# Patient Record
Sex: Female | Born: 1990 | Race: Black or African American | Hispanic: No | Marital: Single | Smoking: Current every day smoker
Health system: Southern US, Community
[De-identification: ages and names within clinical notes are randomized; demographics above are authoritative.]

## PROBLEM LIST (undated history)

## (undated) DIAGNOSIS — J45909 Unspecified asthma, uncomplicated: Secondary | ICD-10-CM

## (undated) DIAGNOSIS — K219 Gastro-esophageal reflux disease without esophagitis: Secondary | ICD-10-CM

## (undated) DIAGNOSIS — A64 Unspecified sexually transmitted disease: Secondary | ICD-10-CM

## (undated) DIAGNOSIS — G43909 Migraine, unspecified, not intractable, without status migrainosus: Secondary | ICD-10-CM

## (undated) HISTORY — PX: OTHER SURGICAL HISTORY: SHX169

## (undated) HISTORY — DX: Unspecified sexually transmitted disease: A64

## (undated) HISTORY — DX: Migraine, unspecified, not intractable, without status migrainosus: G43.909

---
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
## 2012-02-08 ENCOUNTER — Encounter (HOSPITAL_COMMUNITY): Payer: Self-pay | Admitting: *Deleted

## 2012-02-08 ENCOUNTER — Emergency Department (HOSPITAL_COMMUNITY)
Admission: EM | Admit: 2012-02-08 | Discharge: 2012-02-08 | Disposition: A | Discharge status: Home / Self Care | Payer: Self-pay | Attending: Emergency Medicine | Admitting: Emergency Medicine

## 2012-02-08 DIAGNOSIS — F17200 Nicotine dependence, unspecified, uncomplicated: Secondary | ICD-10-CM | POA: Insufficient documentation

## 2012-02-08 DIAGNOSIS — Z7982 Long term (current) use of aspirin: Secondary | ICD-10-CM | POA: Insufficient documentation

## 2012-02-08 DIAGNOSIS — N390 Urinary tract infection, site not specified: Secondary | ICD-10-CM | POA: Insufficient documentation

## 2012-02-08 DIAGNOSIS — R51 Headache: Secondary | ICD-10-CM | POA: Insufficient documentation

## 2012-02-08 DIAGNOSIS — M129 Arthropathy, unspecified: Secondary | ICD-10-CM | POA: Insufficient documentation

## 2012-02-08 LAB — URINALYSIS, ROUTINE W REFLEX MICROSCOPIC
Bilirubin Urine: NEGATIVE
Glucose, UA: NEGATIVE mg/dL
Ketones, ur: NEGATIVE mg/dL
Nitrite: NEGATIVE
Protein, ur: NEGATIVE mg/dL
Specific Gravity, Urine: 1.017 (ref 1.005–1.030)
Urobilinogen, UA: 0.2 mg/dL (ref 0.0–1.0)
pH: 6 (ref 5.0–8.0)

## 2012-02-08 LAB — PREGNANCY, URINE: Preg Test, Ur: NEGATIVE

## 2012-02-08 LAB — URINE MICROSCOPIC-ADD ON

## 2012-02-08 MED ORDER — KETOROLAC TROMETHAMINE 30 MG/ML IJ SOLN
15.0000 mg | Freq: Once | INTRAMUSCULAR | Status: AC
  Administered 2012-02-08: 11:00:00 via INTRAVENOUS
  Filled 2012-02-08: qty 1

## 2012-02-08 MED ORDER — SULFAMETHOXAZOLE-TRIMETHOPRIM 800-160 MG PO TABS: 1.0000 | ORAL_TABLET | Freq: Two times a day (BID) | ORAL | Status: AC

## 2012-02-08 MED ORDER — OXYCODONE-ACETAMINOPHEN 5-325 MG PO TABS
2.0000 | ORAL_TABLET | Freq: Once | ORAL | Status: AC
  Administered 2012-02-08: 2 via ORAL
  Filled 2012-02-08: qty 2

## 2012-02-08 NOTE — ED Notes (Signed)
Pt states "been having headaches off and on for 1 week, the abdominal pain has been going on for about 2 days, it hurts when laying on my stomach"

## 2012-02-08 NOTE — ED Provider Notes (Signed)
History    21 year old female with a chief complaint of headache. "Sometimes hurts on the left. Sometimes hurts on the right. Symptoms are worse on the back. I was a hurts everywhere. Intermittent. Achy in character. Patient reports relief with Excedrin, but the headache returns when she wakes up from sleeping or taking a nap.  No neck pain or neck stiffness. No acute visual changes. No numbness, tingling or loss of strength. No fevers or chills. Has had similar headaches previously. Denies trauma. Denies use of blood thinning medication. Denies history of cancer. Patient also reports mild diffuse abdominal pain for the past 2 days. Patient has the pain when she sleeps on her stomach. Has not noticed the pain at any other times. No nausea or vomiting. No urinary complaints. No unusual vaginal bleeding or discharge. Denies history of abdominal or pelvic surgery.  CSN: [id number]  Arrival date & time 02/08/12  7829   First MD Initiated Contact with Patient 02/08/12 [phone]      Chief Complaint  Patient presents with  . Abdominal Pain  . Headache    (Consider location/radiation/quality/duration/timing/severity/associated sxs/prior treatment) HPI  Past Medical History  Diagnosis Date  . Arthritis     History reviewed. No pertinent past surgical history.  No family history on file.  History  Substance Use Topics  . Smoking status: Current Everyday Smoker -- 0.5 packs/day  . Smokeless tobacco: Not on file  . Alcohol Use: No    OB History    Grav Para Term Preterm Abortions TAB SAB Ect Mult Living                  Review of Systems   Review of symptoms negative unless otherwise noted in HPI.   Allergies  Review of patient's allergies indicates no known allergies.  Home Medications   Current Outpatient Rx  Name Route Sig Dispense Refill  . ALBUTEROL SULFATE HFA 108 (90 BASE) MCG/ACT IN AERS Inhalation Inhale 2 puffs into the lungs every 6 (six) hours as needed. For shortness  of breath    . ASPIRIN-ACETAMINOPHEN-CAFFEINE 250-250-65 MG PO TABS Oral Take 2 tablets by mouth every 6 (six) hours as needed. For      BP 125/98  Pulse 86  Temp 98 F (36.7 C) (Oral)  Resp 18  Wt 227 lb 3.2 oz (103.057 kg)  SpO2 99%  LMP 01/18/2012  Physical Exam  Nursing note and vitals reviewed. Constitutional: She is oriented to person, place, and time. She appears well-developed and well-nourished. No distress.       Laying in bed. No acute distress. Obese.  HENT:  Head: Normocephalic and atraumatic.  Eyes: Conjunctivae are normal. Right eye exhibits no discharge. Left eye exhibits no discharge.  Neck: Normal range of motion. Neck supple.       Neck is supple.  Cardiovascular: Normal rate, regular rhythm and normal heart sounds.  Exam reveals no gallop and no friction rub.   No murmur heard. Pulmonary/Chest: Effort normal and breath sounds normal. No respiratory distress.  Abdominal: Soft. She exhibits no distension. There is no tenderness.       Abdomen soft and nontender. No masses palpated. No surgical scars noted.  Genitourinary:       No CVA tenderness  Musculoskeletal: She exhibits no edema and no tenderness.  Lymphadenopathy:    She has no cervical adenopathy.  Neurological: She is alert and oriented to person, place, and time. No cranial nerve deficit. She exhibits normal muscle tone. Coordination normal.  Good finger nose testing bilaterally. Gait is steady.  Skin: Skin is warm and dry. She is not diaphoretic.  Psychiatric: She has a normal mood and affect. Her behavior is normal. Thought content normal.    ED Course  Procedures (including critical care time)  Labs Reviewed  URINALYSIS, ROUTINE W REFLEX MICROSCOPIC - Abnormal; Notable for the following:    APPearance CLOUDY (*)     Hgb urine dipstick MODERATE (*)     Leukocytes, UA LARGE (*)     All other components within normal limits  URINE MICROSCOPIC-ADD ON - Abnormal; Notable for the following:     Squamous Epithelial / LPF FEW (*)     Bacteria, UA FEW (*)     All other components within normal limits  PREGNANCY, URINE   No results found.   1. Headache   2. UTI (lower urinary tract infection)       MDM  49-year-old female with headache. Very low suspicion for emergent etiology. Patient has a nonfocal neurological examination and no acute neurological complaints. There is no history of trauma. She is afebrile and no signs of meningismus. Headache was gradual onset. She does not use blood thinning medication. Please do not treatment at this time. Abdominal exam is benign. Not pregnant. UA suggestive of infection. Plan course abx. Do not feel that imaging or further labs indicated at this time.       Raeford Razor, MD 02/08/12 1028

## 2012-02-13 ENCOUNTER — Emergency Department (HOSPITAL_COMMUNITY)
Admission: EM | Admit: 2012-02-13 | Discharge: 2012-02-13 | Disposition: A | Discharge status: Home / Self Care | Payer: Self-pay | Attending: Emergency Medicine | Admitting: Emergency Medicine

## 2012-02-13 ENCOUNTER — Encounter (HOSPITAL_COMMUNITY): Payer: Self-pay | Admitting: Emergency Medicine

## 2012-02-13 DIAGNOSIS — F17200 Nicotine dependence, unspecified, uncomplicated: Secondary | ICD-10-CM | POA: Insufficient documentation

## 2012-02-13 DIAGNOSIS — R6889 Other general symptoms and signs: Secondary | ICD-10-CM | POA: Insufficient documentation

## 2012-02-13 DIAGNOSIS — T370X5A Adverse effect of sulfonamides, initial encounter: Secondary | ICD-10-CM | POA: Insufficient documentation

## 2012-02-13 DIAGNOSIS — T7840XA Allergy, unspecified, initial encounter: Secondary | ICD-10-CM

## 2012-02-13 LAB — URINALYSIS, ROUTINE W REFLEX MICROSCOPIC
Bilirubin Urine: NEGATIVE
Hgb urine dipstick: NEGATIVE
Ketones, ur: NEGATIVE mg/dL
Protein, ur: NEGATIVE mg/dL
Urobilinogen, UA: 0.2 mg/dL (ref 0.0–1.0)

## 2012-02-13 LAB — URINE MICROSCOPIC-ADD ON

## 2012-02-13 MED ORDER — FAMOTIDINE 20 MG PO TABS
20.0000 mg | ORAL_TABLET | Freq: Once | ORAL | Status: AC
  Administered 2012-02-13: 20 mg via ORAL
  Filled 2012-02-13: qty 1

## 2012-02-13 MED ORDER — DIPHENHYDRAMINE HCL 25 MG PO CAPS
25.0000 mg | ORAL_CAPSULE | Freq: Once | ORAL | Status: AC
  Administered 2012-02-13: 25 mg via ORAL
  Filled 2012-02-13: qty 1

## 2012-02-13 MED ORDER — FAMOTIDINE 40 MG PO TABS: 40.0000 mg | ORAL_TABLET | Freq: Every day | ORAL | Status: DC

## 2012-02-13 MED ORDER — PREDNISONE 20 MG PO TABS: ORAL_TABLET | ORAL | Status: AC

## 2012-02-13 MED ORDER — PREDNISONE 20 MG PO TABS
60.0000 mg | ORAL_TABLET | Freq: Once | ORAL | Status: AC
  Administered 2012-02-13: 60 mg via ORAL
  Filled 2012-02-13: qty 3

## 2012-02-13 NOTE — ED Notes (Signed)
Pt st's she thinks she had an allergic reaction to ex-lax, pt st's she felt her throat closing up after taking it.  Pt in no acute distress, all lung sounds clear, no stridor.  No swelling or rash.

## 2012-02-13 NOTE — ED Provider Notes (Signed)
History     CSN: [id number]  Arrival date & time 02/13/12  0112   First MD Initiated Contact with Patient 02/13/12 0217      Chief Complaint  Patient presents with  . Allergic Reaction    (Consider location/radiation/quality/duration/timing/severity/associated sxs/prior treatment) Patient is a 21 y.o. female presenting with allergic reaction. The history is provided by the patient.  Allergic Reaction The primary symptoms do not include wheezing, cough, abdominal pain, nausea, vomiting, diarrhea, dizziness, palpitations, rash, angioedema or urticaria. The current episode started 3 to 5 hours ago. The problem has not changed since onset.This is a new problem.  The onset of the reaction was associated with a change in medication. Significant symptoms that are not present include eye redness, flushing, rhinorrhea or itching.  Taking bactrim for UTI and then took ex lax and felt throat tightening so came in for evaluation.  No hives.    Past Medical History  Diagnosis Date  . Arthritis     History reviewed. No pertinent past surgical history.  No family history on file.  History  Substance Use Topics  . Smoking status: Current Everyday Smoker -- 0.5 packs/day  . Smokeless tobacco: Not on file  . Alcohol Use: No    OB History    Grav Para Term Preterm Abortions TAB SAB Ect Mult Living                  Review of Systems  HENT: Negative for rhinorrhea.   Eyes: Negative for redness.  Respiratory: Negative for cough and wheezing.   Cardiovascular: Negative for palpitations.  Gastrointestinal: Negative for nausea, vomiting, abdominal pain and diarrhea.  Skin: Negative for color change, flushing, itching and rash.  Neurological: Negative for dizziness.  All other systems reviewed and are negative.    Allergies  Review of patient's allergies indicates no known allergies.  Home Medications   Current Outpatient Rx  Name Route Sig Dispense Refill  .  ASPIRIN-ACETAMINOPHEN-CAFFEINE 250-250-65 MG PO TABS Oral Take 2 tablets by mouth every 6 (six) hours as needed. For    . SULFAMETHOXAZOLE-TRIMETHOPRIM 800-160 MG PO TABS Oral Take 1 tablet by mouth every 12 (twelve) hours. 10 tablet 0  . ALBUTEROL SULFATE HFA 108 (90 BASE) MCG/ACT IN AERS Inhalation Inhale 2 puffs into the lungs every 6 (six) hours as needed. For shortness of breath      BP 119/69  Pulse 77  Temp 98.5 F (36.9 C) (Oral)  Resp 16  SpO2 100%  LMP 01/18/2012  Physical Exam  Constitutional: She appears well-developed and well-nourished.  HENT:  Head: Normocephalic and atraumatic.  Mouth/Throat: Oropharynx is clear and moist.       No swelling of the lips tongue or uvula  Eyes: Conjunctivae are normal. Pupils are equal, round, and reactive to light.  Neck: Normal range of motion. Neck supple.  Cardiovascular: Normal rate and regular rhythm.   Pulmonary/Chest: Effort normal and breath sounds normal. No stridor. She has no wheezes. She has no rales.  Abdominal: Soft. Bowel sounds are normal. There is no tenderness. There is no rebound and no guarding.  Musculoskeletal: Normal range of motion.  Neurological: She is alert.  Skin: Skin is warm and dry. No rash noted.  Psychiatric: She has a normal mood and affect.    ED Course  Procedures (including critical care time)  Labs Reviewed  URINALYSIS, ROUTINE W REFLEX MICROSCOPIC - Abnormal; Notable for the following:    Leukocytes, UA SMALL (*)  All other components within normal limits  URINE MICROSCOPIC-ADD ON - Abnormal; Notable for the following:    Squamous Epithelial / LPF FEW (*)     All other components within normal limits   No results found.   No diagnosis found.    MDM  Stop bactrim and list as allergy additionally no more ex lax.  Return for worsening symptoms.  Wheezing hives or any concerns.  Take medications as directed along with benadryl.  Follow up with your PMD in 2 days patient verbalizes  understanding and agrees to follow up        Dashanique Brownstein Smitty Cords, MD 02/13/12 [phone]

## 2012-02-13 NOTE — ED Notes (Signed)
Pt alert, arrives from home, ? Allergic reaction to ex lax, pt states recent constipation after UTI, took some ex lax, feels throat was tight, resp even unlabored, skin pwd, no stridor noted

## 2012-04-23 DIAGNOSIS — R079 Chest pain, unspecified: Secondary | ICD-10-CM | POA: Insufficient documentation

## 2012-04-23 DIAGNOSIS — M25569 Pain in unspecified knee: Secondary | ICD-10-CM | POA: Insufficient documentation

## 2012-04-24 ENCOUNTER — Emergency Department (HOSPITAL_COMMUNITY)
Admission: EM | Admit: 2012-04-24 | Discharge: 2012-04-24 | Disposition: A | Discharge status: Home / Self Care | Payer: Self-pay | Attending: Emergency Medicine | Admitting: Emergency Medicine

## 2012-04-24 ENCOUNTER — Encounter (HOSPITAL_COMMUNITY): Payer: Self-pay | Admitting: *Deleted

## 2012-04-24 ENCOUNTER — Emergency Department (HOSPITAL_COMMUNITY)
Admission: EM | Admit: 2012-04-24 | Discharge: 2012-04-24 | Discharge status: Against Medical Advice | Payer: Self-pay | Attending: Emergency Medicine | Admitting: Emergency Medicine

## 2012-04-24 ENCOUNTER — Emergency Department (HOSPITAL_COMMUNITY): Payer: Self-pay

## 2012-04-24 DIAGNOSIS — F17200 Nicotine dependence, unspecified, uncomplicated: Secondary | ICD-10-CM | POA: Insufficient documentation

## 2012-04-24 DIAGNOSIS — R079 Chest pain, unspecified: Secondary | ICD-10-CM | POA: Insufficient documentation

## 2012-04-24 DIAGNOSIS — R0789 Other chest pain: Secondary | ICD-10-CM

## 2012-04-24 HISTORY — DX: Unspecified asthma, uncomplicated: J45.909

## 2012-04-24 MED ORDER — IBUPROFEN 800 MG PO TABS
800.0000 mg | ORAL_TABLET | Freq: Once | ORAL | Status: AC
  Administered 2012-04-24: 800 mg via ORAL
  Filled 2012-04-24: qty 1

## 2012-04-24 NOTE — ED Notes (Signed)
MD at bedside. 

## 2012-04-24 NOTE — ED Provider Notes (Signed)
History     CSN: [id number]  Arrival date & time 04/24/12  1701   First MD Initiated Contact with Patient 04/24/12 1826      Chief Complaint  Patient presents with  . Chest Pain  . Knee Pain    left    (Consider location/radiation/quality/duration/timing/severity/associated sxs/prior treatment) The history is provided by the patient.  Frances Murphy is a 21 y.o. female here with chest pain, SOB. Mid sternal chest pain yesterday after lifting heavy trays. 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 She felt a little SOB but denies fever or cough. No recent travel or leg swelling. Only PE risk factor is OCP use. She also has L knee pain that is chronic. She had an old injury as a child and now it hurts intermittently. Able to ambulate on the leg. Only cardiac risk factor is that she is a smoker.    Past Medical History  Diagnosis Date  . Asthma     History reviewed. No pertinent past surgical history.  No family history on file.  History  Substance Use Topics  . Smoking status: Current Every Day Smoker -- 0.5 packs/day  . Smokeless tobacco: Not on file  . Alcohol Use: No    OB History    Grav Para Term Preterm Abortions TAB SAB Ect Mult Living                  Review of Systems  Respiratory: Positive for shortness of breath.   Cardiovascular: Positive for chest pain.  Musculoskeletal:       Leg pain  All other systems reviewed and are negative.    Allergies  Review of patient's allergies indicates no known allergies.  Home Medications  No current outpatient prescriptions on file.  BP 116/65  Pulse 66  Temp 98.6 F (37 C) (Oral)  Resp 18  SpO2 100%  LMP 03/19/2012  Physical Exam  Nursing note and vitals reviewed. Constitutional: She is oriented to person, place, and time. She appears well-developed and well-nourished.       Comfortable   HENT:  Head: Normocephalic.  Mouth/Throat: Oropharynx is clear and moist.  Eyes: Conjunctivae normal are normal. Pupils are  equal, round, and reactive to light.  Neck: Normal range of motion. Neck supple.  Cardiovascular: Normal rate, regular rhythm and normal heart sounds.   Pulmonary/Chest: Effort normal and breath sounds normal.       + reproducible tenderness on palpation of sternum  Abdominal: Soft. Bowel sounds are normal.  Musculoskeletal: Normal range of motion.       No calf tenderness, no edema.   L knee there is calcification of the tibial tuberosity without any tenderness. No tenderness of ROM of knee.   Neurological: She is alert and oriented to person, place, and time.  Skin: Skin is warm and dry.  Psychiatric: She has a normal mood and affect. Her behavior is normal. Judgment and thought content normal.    ED Course  Procedures (including critical care time)  Labs Reviewed - No data to display Dg Chest 2 View  04/24/2012  *RADIOLOGY REPORT*  Clinical Data: 21 year old female with chest pain.  CHEST - 2 VIEW  Comparison: None.  Findings: Cardiac size at the upper limits of normal. Other mediastinal contours are within normal limits.  Visualized tracheal air column is within normal limits.  Lung volumes are within normal limits.  The lungs are clear.  No pneumothorax. No acute osseous abnormality identified.  IMPRESSION: No acute cardiopulmonary abnormality.  Original Report Authenticated By: Harley Hallmark, M.D.      No diagnosis found.   Date: 04/24/2012  Rate: 83  Rhythm: normal sinus rhythm  QRS Axis: normal  Intervals: normal  ST/T Wave abnormalities: nonspecific ST changes  Conduction Disutrbances:none  Narrative Interpretation:   Old EKG Reviewed: unchanged    MDM  Frances Murphy is a 21 y.o. female here with chest pain that is reproducible. EKG unchanged, she is low risk for ACS and PERC negative for PE. Will give NSAIDS, get CXR and reassess.   8:12 PM Felt better after meds. CXR, EKG nl, will d/c home.        Richardean Canal, MD 04/24/12 2012

## 2012-04-24 NOTE — ED Notes (Signed)
Chest pain upper mid sternum started yesterday, also has left knee pain from old injury

## 2012-04-24 NOTE — ED Notes (Signed)
Pt states she's having midsternal chest pain that started last night, states feels like pressure in the middle of her chest, states pain radiates up in shoulders bil, having shortness of breath per pt, denies n/v/d, blurred vision, numbness or tingling. 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 Also having L knee pain, mother states when pt was little had injury to that knee and since has had a knot on knee that has gotten bigger, pt states knee hurts worse after working 12 hour shifts, but if she is off for a couple days it will feel better. Pt in no distress at this time.

## 2012-07-03 ENCOUNTER — Encounter (HOSPITAL_COMMUNITY): Payer: Self-pay | Admitting: Emergency Medicine

## 2012-07-03 ENCOUNTER — Emergency Department (HOSPITAL_COMMUNITY)
Admission: EM | Admit: 2012-07-03 | Discharge: 2012-07-03 | Disposition: A | Discharge status: Home / Self Care | Payer: Self-pay | Attending: Emergency Medicine | Admitting: Emergency Medicine

## 2012-07-03 DIAGNOSIS — J3489 Other specified disorders of nose and nasal sinuses: Secondary | ICD-10-CM | POA: Insufficient documentation

## 2012-07-03 DIAGNOSIS — Z79899 Other long term (current) drug therapy: Secondary | ICD-10-CM | POA: Insufficient documentation

## 2012-07-03 DIAGNOSIS — F17200 Nicotine dependence, unspecified, uncomplicated: Secondary | ICD-10-CM | POA: Insufficient documentation

## 2012-07-03 DIAGNOSIS — H9209 Otalgia, unspecified ear: Secondary | ICD-10-CM | POA: Insufficient documentation

## 2012-07-03 DIAGNOSIS — J029 Acute pharyngitis, unspecified: Secondary | ICD-10-CM | POA: Insufficient documentation

## 2012-07-03 DIAGNOSIS — J45909 Unspecified asthma, uncomplicated: Secondary | ICD-10-CM | POA: Insufficient documentation

## 2012-07-03 DIAGNOSIS — R071 Chest pain on breathing: Secondary | ICD-10-CM | POA: Insufficient documentation

## 2012-07-03 DIAGNOSIS — J069 Acute upper respiratory infection, unspecified: Secondary | ICD-10-CM | POA: Insufficient documentation

## 2012-07-03 DIAGNOSIS — R05 Cough: Secondary | ICD-10-CM | POA: Insufficient documentation

## 2012-07-03 DIAGNOSIS — R6889 Other general symptoms and signs: Secondary | ICD-10-CM | POA: Insufficient documentation

## 2012-07-03 DIAGNOSIS — R059 Cough, unspecified: Secondary | ICD-10-CM | POA: Insufficient documentation

## 2012-07-03 MED ORDER — HYDROCODONE-ACETAMINOPHEN 7.5-500 MG/15ML PO SOLN: 15.0000 mL | Freq: Four times a day (QID) | ORAL | Status: DC | PRN

## 2012-07-03 MED ORDER — HYDROCODONE-ACETAMINOPHEN 7.5-500 MG/15ML PO SOLN
10.0000 mL | Freq: Once | ORAL | Status: AC
  Administered 2012-07-03: 10 mL via ORAL
  Filled 2012-07-03: qty 15

## 2012-07-03 NOTE — ED Notes (Signed)
Pt reports chest wall pain with cough, sinus pressure, sore throat and r/ear pain x 48 hrs

## 2012-07-03 NOTE — ED Provider Notes (Signed)
Medical screening examination/treatment/procedure(s) were performed by non-physician practitioner and as supervising physician I was immediately available for consultation/collaboration.  Juliet Rude. Rubin Payor, MD 07/03/12 1546

## 2012-07-03 NOTE — ED Provider Notes (Signed)
History     CSN: [id number]  Arrival date & time 07/03/12  1310   First MD Initiated Contact with Patient 07/03/12 1401      Chief Complaint  Patient presents with  . Facial Pain    sinus pressure x48hrs  . Sore Throat  . Nasal Congestion  . Cough    chest wall pain with cough x 48 hrs    (Consider location/radiation/quality/duration/timing/severity/associated sxs/prior treatment) HPI  21 year old female presents with URI sxs.  Pt reports for the past 2 days she has gradual onset of headache, nasal congestion, sore throat, sneezing, non productive cough, and chest congestion.  Severity is mild/moderate.  Denies fever, chills, neck stiffness, sob, abd pain, n/v/d, or rash.  Has tried tylenol allegra and albuterol inhaler without relief.  No recent sick contact.  Pt is a smoker, and has hx of asthma.    Past Medical History  Diagnosis Date  . Asthma     History reviewed. No pertinent past surgical history.  Family History  Problem Relation Age of Onset  . Hypertension Mother   . Hypertension Father     History  Substance Use Topics  . Smoking status: Current Every Day Smoker -- 0.5 packs/day  . Smokeless tobacco: Not on file  . Alcohol Use: No    OB History    Grav Para Term Preterm Abortions TAB SAB Ect Mult Living                  Review of Systems  Constitutional: Negative for fever and chills.  HENT: Positive for ear pain, congestion, sore throat, rhinorrhea, sneezing and sinus pressure. Negative for drooling, mouth sores, trouble swallowing, neck pain, neck stiffness, voice change and postnasal drip.   Respiratory: Positive for cough. Negative for chest tightness and shortness of breath.   Cardiovascular: Negative for chest pain.  Gastrointestinal: Negative for abdominal pain.  Skin: Negative for rash.    Allergies  Review of patient's allergies indicates no known allergies.  Home Medications   Current Outpatient Rx  Name  Route  Sig  Dispense   Refill  . FEXOFENADINE HCL 180 MG PO TABS   Oral   Take 180 mg by mouth once.         Marland Kitchen PSEUDOEPHED-APAP-GUAIFENESIN 30-325-200 MG PO TABS   Oral   Take 2 tablets by mouth once.           BP 114/70  Pulse 89  Temp 98.1 F (36.7 C) (Oral)  Resp 18  SpO2 100%  LMP 06/24/2012  Physical Exam  Nursing note and vitals reviewed. Constitutional: She is oriented to person, place, and time. She appears well-developed and well-nourished. No distress.  HENT:  Right Ear: Hearing, tympanic membrane and external ear normal.  Left Ear: Hearing, tympanic membrane and external ear normal.  Nose: Rhinorrhea present. No septal deviation or nasal septal hematoma. Right sinus exhibits no maxillary sinus tenderness and no frontal sinus tenderness. Left sinus exhibits no maxillary sinus tenderness and no frontal sinus tenderness.  Mouth/Throat: Uvula is midline, oropharynx is clear and moist and mucous membranes are normal. No uvula swelling or dental caries. No oropharyngeal exudate or posterior oropharyngeal edema.  Eyes: Conjunctivae normal and EOM are normal. Pupils are equal, round, and reactive to light.  Neck: Normal range of motion. Neck supple.  Cardiovascular: Normal rate and regular rhythm.   Pulmonary/Chest: Effort normal and breath sounds normal.  Abdominal: Soft. Bowel sounds are normal. There is no tenderness.  Lymphadenopathy:    She has no cervical adenopathy.  Neurological: She is alert and oriented to person, place, and time.  Skin: Skin is warm. No rash noted.  Psychiatric: She has a normal mood and affect.    ED Course  Procedures (including critical care time)  Labs Reviewed - No data to display No results found.   No diagnosis found.  1. URI  MDM  Pt with URI sxs.  She is afebrile, VSS.  Lung CTAB.    Will d/c with lortab elixir.  Care instruction given.    BP 114/70  Pulse 89  Temp 98.1 F (36.7 C) (Oral)  Resp 18  SpO2 100%  LMP  06/24/2012       Fayrene Helper, PA-C 07/03/12 1459

## 2012-08-22 ENCOUNTER — Encounter (HOSPITAL_COMMUNITY): Payer: Self-pay | Admitting: *Deleted

## 2012-08-22 ENCOUNTER — Emergency Department (HOSPITAL_COMMUNITY)
Admission: EM | Admit: 2012-08-22 | Discharge: 2012-08-23 | Disposition: A | Discharge status: Home / Self Care | Payer: Medicaid Other | Attending: Emergency Medicine | Admitting: Emergency Medicine

## 2012-08-22 DIAGNOSIS — R42 Dizziness and giddiness: Secondary | ICD-10-CM | POA: Insufficient documentation

## 2012-08-22 DIAGNOSIS — R51 Headache: Secondary | ICD-10-CM | POA: Insufficient documentation

## 2012-08-22 DIAGNOSIS — H539 Unspecified visual disturbance: Secondary | ICD-10-CM | POA: Insufficient documentation

## 2012-08-22 DIAGNOSIS — J45909 Unspecified asthma, uncomplicated: Secondary | ICD-10-CM | POA: Insufficient documentation

## 2012-08-22 DIAGNOSIS — Z79899 Other long term (current) drug therapy: Secondary | ICD-10-CM | POA: Insufficient documentation

## 2012-08-22 DIAGNOSIS — F17200 Nicotine dependence, unspecified, uncomplicated: Secondary | ICD-10-CM | POA: Insufficient documentation

## 2012-08-22 DIAGNOSIS — M542 Cervicalgia: Secondary | ICD-10-CM | POA: Insufficient documentation

## 2012-08-22 DIAGNOSIS — Z7982 Long term (current) use of aspirin: Secondary | ICD-10-CM | POA: Insufficient documentation

## 2012-08-22 NOTE — ED Provider Notes (Signed)
History  This chart was scribed for non-physician practitioner working with Glynn Octave, MD by Erskine Emery, ED Scribe. This patient was seen in room WTR5/WTR5 and the patient's care was started at 23:28.   CSN: [id number]  Arrival date & time 08/22/12  2209   First MD Initiated Contact with Patient 08/22/12 2328      No chief complaint on file.   (Consider location/radiation/quality/duration/timing/severity/associated sxs/prior treatment) The history is provided by the patient. No language interpreter was used.  Frances Murphy is a 22 y.o. female who presents to the Emergency Department complaining of a constant throbbing right-sided headache with no radiation of about an 8-9/10 severity since Friday (4 days). Pt reports some associated light headedness, dizziness, photophobia, and right-sided neck pain but denies any neck stiffness, fever, chills, rash, numbness, weakness, nausea, emesis, diarrhea, or aversion to sound. Pt reports a h/o headaches but reports this headache is different because it is not relieved by her normal meds (excedrin migraine and tylenol). Pt is not pregnant that she knows of, she recently took a home pregnancy test that was negative. She is a smoker, about 5-6 cigarettes/day. She reports she is trying to quit. Pt has no PCP.  Past Medical History  Diagnosis Date  . Asthma     History reviewed. No pertinent past surgical history.  Family History  Problem Relation Age of Onset  . Hypertension Mother   . Hypertension Father     History  Substance Use Topics  . Smoking status: Current Every Day Smoker -- 0.5 packs/day  . Smokeless tobacco: Not on file  . Alcohol Use: No    OB History    Grav Para Term Preterm Abortions TAB SAB Ect Mult Living                  Review of Systems  Constitutional: Negative for fever and chills.  HENT: Positive for neck pain. Negative for neck stiffness.   Eyes: Positive for visual disturbance.  Respiratory:  Negative for cough.   Cardiovascular: Negative for chest pain.  Gastrointestinal: Negative for nausea, vomiting and diarrhea.  Genitourinary: Negative for dysuria.  Musculoskeletal: Negative for back pain.  Skin: Negative for rash.  Neurological: Positive for dizziness and headaches. Negative for weakness and numbness.  Psychiatric/Behavioral: Negative for sleep disturbance.  All other systems reviewed and are negative.    Allergies  Review of patient's allergies indicates no known allergies.  Home Medications   Current Outpatient Rx  Name  Route  Sig  Dispense  Refill  . ACETAMINOPHEN 500 MG PO TABS   Oral   Take 1,000 mg by mouth every 6 (six) hours as needed. For pain         . ALBUTEROL SULFATE HFA 108 (90 BASE) MCG/ACT IN AERS   Inhalation   Inhale 2 puffs into the lungs every 6 (six) hours as needed.         . ASPIRIN-ACETAMINOPHEN-CAFFEINE 250-250-65 MG PO TABS   Oral   Take 1 tablet by mouth every 6 (six) hours as needed. For pain           Triage Vitals: BP 142/94  Pulse 75  Temp 97.5 F (36.4 C) (Oral)  Resp 20  Ht 5\' 8"  (1.727 m)  Wt 227 lb (102.967 kg)  BMI 34.52 kg/m2  SpO2 100%  LMP 08/15/2012  Physical Exam  Nursing note and vitals reviewed. Constitutional: She is oriented to person, place, and time. She appears well-developed and well-nourished. No distress.  HENT:  Head: Normocephalic and atraumatic.  Right Ear: External ear normal.  Left Ear: External ear normal.  Nose: Nose normal.  Mouth/Throat: Oropharynx is clear and moist. No oropharyngeal exudate.  Eyes: EOM are normal. Pupils are equal, round, and reactive to light.       No photophobia.  Neck: Normal range of motion. Neck supple. No tracheal deviation present.       No nuchal rigidity.  Cardiovascular: Normal rate, regular rhythm and normal heart sounds.   Pulmonary/Chest: Effort normal and breath sounds normal. No respiratory distress.  Abdominal: Soft. She exhibits no  distension.  Musculoskeletal: Normal range of motion. She exhibits no edema.       Normal gait.  Neurological: She is alert and oriented to person, place, and time. No cranial nerve deficit. Coordination normal. GCS eye subscore is 4. GCS verbal subscore is 5. GCS motor subscore is 6.       No focal or neurological deficit.  Skin: Skin is warm and dry.  Psychiatric: She has a normal mood and affect.    ED Course  Procedures (including critical care time) DIAGNOSTIC STUDIES: Oxygen Saturation is 100% on room air, normal by my interpretation.    COORDINATION OF CARE: 23:40--I evaluated the patient and we discussed a treatment plan including muscle relaxers and high strength ibuprofen to which the pt agreed. I notified the pt that she does not show signs of an infection or meningitis.  Pt requests a work note.  Labs Reviewed - No data to display No results found.   No diagnosis found.  1. headache  MDM  Pt with hx of headache, presents with worsening headache.  Pt appears non toxic.  No focal neuro deficits, no meningismal sign.  Pt is afebrile, VSS.  Will d/c with muscle relaxant and NSAIDS.  Return precaution discusssed.  Pt agrees with plan.    BP 142/94  Pulse 75  Temp 97.5 F (36.4 C) (Oral)  Resp 20  Ht 5\' 8"  (1.727 m)  Wt 227 lb (102.967 kg)  BMI 34.52 kg/m2  SpO2 100%  LMP 08/15/2012     I personally performed the services described in this documentation, which was scribed in my presence. The recorded information has been reviewed and is accurate.     Fayrene Helper, PA-C 08/23/12 0010

## 2012-08-22 NOTE — ED Notes (Signed)
Pt c/o headache since Friday; unrelieved by otc meds; dizziness; blurred vision; history of same

## 2012-08-23 ENCOUNTER — Emergency Department (HOSPITAL_COMMUNITY)
Admission: EM | Admit: 2012-08-23 | Discharge: 2012-08-23 | Discharge status: Against Medical Advice | Payer: Medicaid Other

## 2012-08-23 MED ORDER — NAPROXEN 500 MG PO TABS: 500.0000 mg | ORAL_TABLET | Freq: Two times a day (BID) | ORAL | Status: DC

## 2012-08-23 MED ORDER — METHOCARBAMOL 500 MG PO TABS: 500.0000 mg | ORAL_TABLET | Freq: Two times a day (BID) | ORAL | Status: DC

## 2012-08-23 MED ORDER — METHOCARBAMOL 500 MG PO TABS
500.0000 mg | ORAL_TABLET | Freq: Once | ORAL | Status: AC
  Administered 2012-08-23: 500 mg via ORAL
  Filled 2012-08-23: qty 1

## 2012-08-23 NOTE — Discharge Instructions (Signed)

## 2012-08-23 NOTE — ED Provider Notes (Signed)
Medical screening examination/treatment/procedure(s) were performed by non-physician practitioner and as supervising physician I was immediately available for consultation/collaboration.   Glynn Octave, MD 08/23/12 [phone]

## 2012-08-23 NOTE — ED Notes (Signed)
Pt. Called multiple times without show. Pt. Moved off the floor

## 2012-08-23 NOTE — ED Notes (Signed)
Pt was called to go to triage.  No answer.

## 2012-09-27 ENCOUNTER — Encounter (HOSPITAL_COMMUNITY): Payer: Self-pay | Admitting: *Deleted

## 2012-09-27 ENCOUNTER — Emergency Department (HOSPITAL_COMMUNITY)
Admission: EM | Admit: 2012-09-27 | Discharge: 2012-09-28 | Disposition: A | Discharge status: Home / Self Care | Payer: Medicaid Other | Attending: Emergency Medicine | Admitting: Emergency Medicine

## 2012-09-27 DIAGNOSIS — O9989 Other specified diseases and conditions complicating pregnancy, childbirth and the puerperium: Secondary | ICD-10-CM | POA: Insufficient documentation

## 2012-09-27 DIAGNOSIS — O99330 Smoking (tobacco) complicating pregnancy, unspecified trimester: Secondary | ICD-10-CM | POA: Insufficient documentation

## 2012-09-27 DIAGNOSIS — R51 Headache: Secondary | ICD-10-CM | POA: Insufficient documentation

## 2012-09-27 DIAGNOSIS — R109 Unspecified abdominal pain: Secondary | ICD-10-CM | POA: Insufficient documentation

## 2012-09-27 DIAGNOSIS — Z79899 Other long term (current) drug therapy: Secondary | ICD-10-CM | POA: Insufficient documentation

## 2012-09-27 DIAGNOSIS — Z3490 Encounter for supervision of normal pregnancy, unspecified, unspecified trimester: Secondary | ICD-10-CM

## 2012-09-27 DIAGNOSIS — J45909 Unspecified asthma, uncomplicated: Secondary | ICD-10-CM | POA: Insufficient documentation

## 2012-09-27 DIAGNOSIS — H538 Other visual disturbances: Secondary | ICD-10-CM | POA: Insufficient documentation

## 2012-09-27 DIAGNOSIS — R197 Diarrhea, unspecified: Secondary | ICD-10-CM | POA: Insufficient documentation

## 2012-09-27 DIAGNOSIS — H53149 Visual discomfort, unspecified: Secondary | ICD-10-CM | POA: Insufficient documentation

## 2012-09-27 LAB — BASIC METABOLIC PANEL
BUN: 6 mg/dL (ref 6–23)
Chloride: 102 mEq/L (ref 96–112)
GFR calc Af Amer: >90 mL/min (ref >90)
GFR calc non Af Amer: >90 mL/min (ref >90)
Glucose, Bld: 117 mg/dL — ABNORMAL HIGH (ref 70–99)
Potassium: 3.3 mEq/L — ABNORMAL LOW (ref 3.5–5.1)
Sodium: 134 mEq/L — ABNORMAL LOW (ref 135–145)

## 2012-09-27 LAB — URINALYSIS, ROUTINE W REFLEX MICROSCOPIC
Bilirubin Urine: NEGATIVE
Hgb urine dipstick: NEGATIVE
Nitrite: NEGATIVE
Specific Gravity, Urine: 1.015 (ref 1.005–1.030)
Urobilinogen, UA: 1 mg/dL (ref 0.0–1.0)
pH: 6 (ref 5.0–8.0)

## 2012-09-27 LAB — POCT PREGNANCY, URINE: Preg Test, Ur: POSITIVE — AB

## 2012-09-27 LAB — CBC
HCT: 39.3 % (ref 36.0–46.0)
Hemoglobin: 13.6 g/dL (ref 12.0–15.0)
WBC: 10.7 10*3/uL — ABNORMAL HIGH (ref 4.0–10.5)

## 2012-09-27 MED ORDER — SODIUM CHLORIDE 0.9 % IV BOLUS (SEPSIS)
1000.0000 mL | Freq: Once | INTRAVENOUS | Status: AC
  Administered 2012-09-27: 1000 mL via INTRAVENOUS

## 2012-09-27 MED ORDER — KETOROLAC TROMETHAMINE 30 MG/ML IJ SOLN
30.0000 mg | Freq: Once | INTRAMUSCULAR | Status: AC
  Administered 2012-09-27: 30 mg via INTRAVENOUS
  Filled 2012-09-27: qty 1

## 2012-09-27 MED ORDER — SODIUM CHLORIDE 0.9 % IV SOLN
INTRAVENOUS | Status: DC
  Administered 2012-09-28: 01:00:00 via INTRAVENOUS

## 2012-09-27 NOTE — ED Notes (Signed)
Pt states for the past 2 weeks she's had lower abdominal cramping, denies n/v, states has had some diarrhea.

## 2012-09-27 NOTE — ED Provider Notes (Signed)
History     CSN: [id number]  Arrival date & time 09/27/12  2127   First MD Initiated Contact with Patient 09/27/12 2215      Chief Complaint  Patient presents with  . Abdominal Pain  . Headache  . Diarrhea    (Consider location/radiation/quality/duration/timing/severity/associated sxs/prior treatment) Patient is a 22 y.o. female presenting with abdominal pain, headaches, and diarrhea. The history is provided by the patient.  Abdominal Pain Pain location:  Suprapubic Pain quality: aching and cramping   Pain quality: not bloating, not burning, not dull, not sharp and not stabbing   Pain radiates to:  Does not radiate Pain severity:  Moderate Onset quality:  Unable to specify Duration:  2 weeks Timing:  Intermittent Progression:  Waxing and waning Chronicity:  New (hurts like this with cycle but never lasted this long. LMP 08/16/2012) Context: not alcohol use, not awakening from sleep, not diet changes, not medication withdrawal, not recent travel, not sick contacts and not trauma   Relieved by:  Nothing Worsened by:  Palpation Ineffective treatments:  None tried Associated symptoms: diarrhea   Associated symptoms: no anorexia, no chest pain, no cough, no dysuria, no fever, no flatus, no hematemesis, no hematochezia, no hematuria, no nausea and no vomiting   Headache Pain location:  R temporal Quality:  Dull Radiates to:  Does not radiate Severity currently:  7/10 Severity at highest:  10/10 Onset quality:  Gradual Duration:  1 week Timing:  Constant (but with medication improvement) Chronicity:  Recurrent Similar to prior headaches: yes   Context: bright light and loud noise   Context: not activity, not caffeine, not coughing, not defecating, not eating, not stress, not exposure to cold air, not intercourse and not straining   Relieved by:  NSAIDs and acetaminophen Worsened by:  Light and sound Ineffective treatments:  NSAIDs and acetaminophen (temporary ) Associated  symptoms: abdominal pain, blurred vision, diarrhea and photophobia   Associated symptoms: no back pain, no congestion, no cough, no ear pain, no fever, no focal weakness, no hearing loss, no myalgias, no nausea, no neck pain, no numbness, no paresthesias, no sinus pressure and no vomiting   Diarrhea Associated symptoms: abdominal pain and headaches   Associated symptoms: no diaphoresis, no fever, no myalgias and no vomiting     Past Medical History  Diagnosis Date  . Asthma     History reviewed. No pertinent past surgical history.  Family History  Problem Relation Age of Onset  . Hypertension Mother   . Hypertension Father     History  Substance Use Topics  . Smoking status: Current Every Day Smoker -- 0.50 packs/day  . Smokeless tobacco: Not on file  . Alcohol Use: No    OB History   Grav Para Term Preterm Abortions TAB SAB Ect Mult Living                  Review of Systems  Constitutional: Negative for fever, diaphoresis and activity change.  HENT: Negative for hearing loss, ear pain, congestion, neck pain and sinus pressure.   Eyes: Positive for blurred vision and photophobia.  Respiratory: Negative for cough.   Cardiovascular: Negative for chest pain.  Gastrointestinal: Positive for abdominal pain and diarrhea. Negative for nausea, vomiting, hematochezia, anorexia, flatus and hematemesis.  Genitourinary: Negative for dysuria and hematuria.  Musculoskeletal: Negative for myalgias and back pain.  Skin: Negative for color change and wound.  Neurological: Positive for headaches. Negative for focal weakness, numbness and paresthesias.  All other  systems reviewed and are negative.    Allergies  Review of patient's allergies indicates no known allergies.  Home Medications   Current Outpatient Rx  Name  Route  Sig  Dispense  Refill  . acetaminophen (TYLENOL) 500 MG tablet   Oral   Take 1,000 mg by mouth every 6 (six) hours as needed for pain.          Marland Kitchen  albuterol (PROVENTIL HFA;VENTOLIN HFA) 108 (90 BASE) MCG/ACT inhaler   Inhalation   Inhale 2 puffs into the lungs every 6 (six) hours as needed for shortness of breath.          . methocarbamol (ROBAXIN) 500 MG tablet   Oral   Take 1 tablet (500 mg total) by mouth 2 (two) times daily.   20 tablet   0   . naproxen (NAPROSYN) 500 MG tablet   Oral   Take 1 tablet (500 mg total) by mouth 2 (two) times daily.   20 tablet   0     BP 140/68  Pulse 95  Temp(Src) 98.3 F (36.8 C) (Oral)  Resp 18  Ht 5\' 7"  (1.702 m)  Wt 251 lb 3.2 oz (113.944 kg)  BMI 39.33 kg/m2  SpO2 99%  LMP 08/16/2012  Physical Exam  Nursing note and vitals reviewed. Constitutional: She is oriented to person, place, and time. She appears well-developed and well-nourished. She appears distressed.  HENT:  Head: Normocephalic and atraumatic.  Eyes: Conjunctivae and EOM are normal. Pupils are equal, round, and reactive to light. No scleral icterus.  Neck: Normal range of motion.  Neck supple with no nuchal rigidity, full pain free ROM. No carotid bruit  Cardiovascular: Normal rate, regular rhythm, normal heart sounds and intact distal pulses.   Pulmonary/Chest: Effort normal and breath sounds normal. No respiratory distress.  Abdominal: Soft. There is tenderness.  suprapubic ttp  Genitourinary:  Pelvic exam chaperoned.  Normal external genitalia.  White discharge present on exam, but no purulent drainage from cervical os.  No cervical motion tenderness Right adnexal tenderness present, but no masses palpated.  Musculoskeletal: Normal range of motion.  Neurological: She is alert and oriented to person, place, and time. She has normal strength.  CN III-XII intact, good coordination, normal gait, strength 5/5 bilaterally, intact distal sensation.  Skin: Skin is warm and dry.  No rash, non diaphoretic    ED Course  Procedures (including critical care time)  Labs Reviewed - No data to display No results  found.   No diagnosis found.    MDM  Pregnancy   22 year old female presents to the ER complaining of headache and lower abdominal pain.  Labs reviewed and patient found to be pregnant.  Smoking cessation discussion was held.  Imaging shows intrauterine pregnancy measured to be 6 weeks.  Mild clue cells seen as well.  Patient advised to followup with OB/GYN to both establish relationship and for possible treatment of BV.  Patient understands that she has GC and Chlamydia cultures pending.  At this time there does not appear to be any evidence of an acute emergency medical condition and the patient appears stable for discharge with appropriate outpatient follow up.Diagnosis was discussed with patient who verbalizes understanding and is agreeable to discharge. Pt case discussed with Dr. Dierdre Highman who agrees with my plan.          Jaci Carrel, [place] 09/28/12 [phone]

## 2012-09-28 ENCOUNTER — Emergency Department (HOSPITAL_COMMUNITY): Payer: Medicaid Other

## 2012-09-28 LAB — GC/CHLAMYDIA PROBE AMP: GC Probe RNA: NEGATIVE

## 2012-09-28 LAB — RPR: RPR Ser Ql: NONREACTIVE

## 2012-09-28 LAB — HIV ANTIBODY (ROUTINE TESTING W REFLEX): HIV: NONREACTIVE

## 2012-09-28 LAB — WET PREP, GENITAL

## 2012-09-28 NOTE — ED Notes (Signed)
Patient transported to Ultrasound 

## 2012-10-03 NOTE — ED Provider Notes (Signed)
Medical screening examination/treatment/procedure(s) were performed by non-physician practitioner and as supervising physician I was immediately available for consultation/collaboration.  Daryn Hicks, MD 10/03/12 1826 

## 2012-10-25 ENCOUNTER — Emergency Department (HOSPITAL_COMMUNITY)
Admission: EM | Admit: 2012-10-25 | Discharge: 2012-10-25 | Disposition: A | Discharge status: Home / Self Care | Payer: Medicaid Other | Attending: Emergency Medicine | Admitting: Emergency Medicine

## 2012-10-25 ENCOUNTER — Encounter (HOSPITAL_COMMUNITY): Payer: Self-pay | Admitting: *Deleted

## 2012-10-25 DIAGNOSIS — J329 Chronic sinusitis, unspecified: Secondary | ICD-10-CM | POA: Insufficient documentation

## 2012-10-25 DIAGNOSIS — R059 Cough, unspecified: Secondary | ICD-10-CM

## 2012-10-25 DIAGNOSIS — Z79899 Other long term (current) drug therapy: Secondary | ICD-10-CM | POA: Insufficient documentation

## 2012-10-25 DIAGNOSIS — R51 Headache: Secondary | ICD-10-CM | POA: Insufficient documentation

## 2012-10-25 DIAGNOSIS — J45909 Unspecified asthma, uncomplicated: Secondary | ICD-10-CM | POA: Insufficient documentation

## 2012-10-25 DIAGNOSIS — F17200 Nicotine dependence, unspecified, uncomplicated: Secondary | ICD-10-CM | POA: Insufficient documentation

## 2012-10-25 DIAGNOSIS — R05 Cough: Secondary | ICD-10-CM

## 2012-10-25 MED ORDER — BUDESONIDE 32 MCG/ACT NA SUSP: 2.0000 | Freq: Every day | NASAL | Status: DC

## 2012-10-25 MED ORDER — ALBUTEROL SULFATE HFA 108 (90 BASE) MCG/ACT IN AERS
2.0000 | INHALATION_SPRAY | RESPIRATORY_TRACT | Status: DC | PRN
  Administered 2012-10-25: 2 via RESPIRATORY_TRACT
  Filled 2012-10-25: qty 6.7

## 2012-10-25 NOTE — ED Notes (Signed)
Pt from home with reports of cough, headache, nasal and chest congestion that started about 3 days ago. Pt reports chest pain with cough and taking a deep breath. Pt endorses being [redacted] weeks pregnant and has a hx of asthma but cannot afford inhaler.

## 2012-10-25 NOTE — ED Provider Notes (Signed)
Medical screening examination/treatment/procedure(s) were performed by non-physician practitioner and as supervising physician I was immediately available for consultation/collaboration.  Raeford Razor, MD 10/25/12 [phone]

## 2012-10-25 NOTE — ED Provider Notes (Signed)
History     CSN: [id number]  Arrival date & time 10/25/12  1213   First MD Initiated Contact with Patient 10/25/12 1237      Chief Complaint  Patient presents with  . Cough  . Headache  . Nasal Congestion    (Consider location/radiation/quality/duration/timing/severity/associated sxs/prior treatment) HPI Comments: Patients at [redacted] weeks gestation presents with 2-3 days of nasal congestion, sinus pressure, cough, with occasional wheezing. Patient states that she has had daily headaches and attributes this to the pregnancy. No treatments at home. No fevers. No ear pain, nausea, vomiting, or diarrhea. No abdominal pain or vaginal bleeding. Onset of symptoms gradual. Course is constant. Nothing makes symptoms better or worse.  Patient is a 22 y.o. female presenting with cough and headaches. The history is provided by the patient.  Cough Associated symptoms: headaches   Headache Associated symptoms: cough     Past Medical History  Diagnosis Date  . Asthma     Past Surgical History  Procedure Laterality Date  . None      Family History  Problem Relation Age of Onset  . Hypertension Mother   . Hypertension Father     History  Substance Use Topics  . Smoking status: Current Every Day Smoker -- 0.50 packs/day    Types: Cigarettes  . Smokeless tobacco: Never Used  . Alcohol Use: No    OB History   Grav Para Term Preterm Abortions TAB SAB Ect Mult Living                  Review of Systems  Respiratory: Positive for cough.   Neurological: Positive for headaches.  All other systems reviewed and are negative.    Allergies  Review of patient's allergies indicates no known allergies.  Home Medications   Current Outpatient Rx  Name  Route  Sig  Dispense  Refill  . acetaminophen (TYLENOL) 500 MG tablet   Oral   Take 1,000 mg by mouth every 6 (six) hours as needed for pain.          Marland Kitchen albuterol (PROVENTIL HFA;VENTOLIN HFA) 108 (90 BASE) MCG/ACT inhaler  Inhalation   Inhale 2 puffs into the lungs every 6 (six) hours as needed for shortness of breath.            BP 121/79  Pulse 93  Temp(Src) 98 F (36.7 C) (Oral)  Resp 16  SpO2 100%  LMP 08/16/2012  Physical Exam  Nursing note and vitals reviewed. Constitutional: She appears well-developed and well-nourished.  HENT:  Head: Normocephalic and atraumatic. No trismus in the jaw.  Right Ear: Tympanic membrane, external ear and ear canal normal.  Left Ear: Tympanic membrane, external ear and ear canal normal.  Nose: Mucosal edema and rhinorrhea present. Right sinus exhibits maxillary sinus tenderness. Right sinus exhibits no frontal sinus tenderness. Left sinus exhibits maxillary sinus tenderness. Left sinus exhibits no frontal sinus tenderness.  Mouth/Throat: Uvula is midline, oropharynx is clear and moist and mucous membranes are normal. Mucous membranes are not dry. No oral lesions. No edematous. No oropharyngeal exudate, posterior oropharyngeal edema, posterior oropharyngeal erythema or tonsillar abscesses.  Eyes: Conjunctivae are normal. Right eye exhibits no discharge. Left eye exhibits no discharge.  Neck: Normal range of motion. Neck supple.  Cardiovascular: Normal rate, regular rhythm and normal heart sounds.   Pulmonary/Chest: Effort normal and breath sounds normal. No respiratory distress. She has no wheezes. She has no rales.  Abdominal: Soft. There is no tenderness.  Lymphadenopathy:  She has no cervical adenopathy.  Neurological: She is alert.  Skin: Skin is warm and dry.  Psychiatric: She has a normal mood and affect.    ED Course  Procedures (including critical care time)  Labs Reviewed - No data to display No results found.   1. Sinusitis   2. Cough     12:49 PM Patient seen and examined. Medications ordered.   Vital signs reviewed and are as follows: Filed Vitals:   10/25/12 1218  BP: 121/79  Pulse: 93  Temp: 98 F (36.7 C)  Resp: 16   Patient  counseled on supportive treatment for sinusitis in setting of pregnancy. Will treat cough with albuterol inhaler. Urged patient to use nasal saline rinses, steroid nasal spray as needed. She is to follow up with her obstetrician as planned.   MDM  Patient with upper respiratory infection symptoms and cough. Will treat as above. Patient appears well, nontoxic. Symptomatic treatment indicated.         Renne Crigler, PA-C 10/25/12 1517

## 2012-11-02 ENCOUNTER — Ambulatory Visit (INDEPENDENT_AMBULATORY_CARE_PROVIDER_SITE_OTHER): Payer: Self-pay | Admitting: Obstetrics and Gynecology

## 2012-11-02 ENCOUNTER — Encounter: Payer: Self-pay | Admitting: Obstetrics and Gynecology

## 2012-11-02 VITALS — BP 117/71 | Temp 97.4°F | Wt 255.1 lb

## 2012-11-02 DIAGNOSIS — Z3400 Encounter for supervision of normal first pregnancy, unspecified trimester: Secondary | ICD-10-CM

## 2012-11-02 DIAGNOSIS — J45909 Unspecified asthma, uncomplicated: Secondary | ICD-10-CM

## 2012-11-02 DIAGNOSIS — F17200 Nicotine dependence, unspecified, uncomplicated: Secondary | ICD-10-CM

## 2012-11-02 DIAGNOSIS — E669 Obesity, unspecified: Secondary | ICD-10-CM

## 2012-11-02 DIAGNOSIS — D573 Sickle-cell trait: Secondary | ICD-10-CM

## 2012-11-02 DIAGNOSIS — Z3401 Encounter for supervision of normal first pregnancy, first trimester: Secondary | ICD-10-CM

## 2012-11-02 DIAGNOSIS — Z803 Family history of malignant neoplasm of breast: Secondary | ICD-10-CM

## 2012-11-02 LAB — POCT URINALYSIS DIP (DEVICE)
Glucose, UA: NEGATIVE mg/dL
Ketones, ur: NEGATIVE mg/dL
Specific Gravity, Urine: 1.025 (ref 1.005–1.030)

## 2012-11-02 MED ORDER — PRENATAL PLUS 27-1 MG PO TABS: 1.0000 | ORAL_TABLET | Freq: Every day | ORAL | Status: DC

## 2012-11-02 MED ORDER — OXYCODONE-ACETAMINOPHEN 5-325 MG PO TABS: 1.0000 | ORAL_TABLET | ORAL | Status: DC | PRN

## 2012-11-02 NOTE — Progress Notes (Signed)
   Subjective:    Frances Murphy is a G1P0000 [redacted]w[redacted]d being seen today for her first obstetrical visit.  Her obstetrical history is significant for obesity. Patient does intend to breast feed. Pregnancy history fully reviewed.  Patient reports cramping earlier but not at present; frequent daily headaches unresponsive to Tylenol. Ceasar Mons Vitals:   11/02/12 1041  BP: 117/71  Temp: 97.4 F (36.3 C)  Weight: 255 lb 1.6 oz (115.713 kg)    HISTORY: OB History   Grav Para Term Preterm Abortions TAB SAB Ect Mult Living   1 0 0 0 0 0 0 0 0 0      # Outc Date GA Lbr Len/2nd Wgt Sex Del Anes PTL Lv   1 CUR              Past Medical History  Diagnosis Date  . Asthma    Past Surgical History  Procedure Laterality Date  . None     Family History  Problem Relation Age of Onset  . Hypertension Mother   . Cancer Mother   . Hypertension Father   . Hypertension Sister   . Hypertension Maternal Grandmother      Exam    Uterus:   S=D DT 160  Pelvic Exam:    Perineum: No Hemorrhoids, Normal Perineum   Vulva: normal   Vagina:  normal mucosa, normal discharge       Cervix: no cervical motion tenderness, no lesions and L/C   Adnexa: not evaluated   Bony Pelvis: gynecoid  System: Breast:  normal appearance, no masses or tenderness   Skin: normal coloration and turgor, no rashes    Neurologic: oriented, grossly non-focal   Extremities: normal strength, tone, and muscle mass   HEENT PERRLA   Mouth/Teeth mucous membranes moist, pharynx normal without lesions and dental hygiene good   Neck supple and no masses   Cardiovascular: regular rate and rhythm, no murmurs or gallops   Respiratory:  appears well, vitals normal, no respiratory distress, acyanotic, normal RR, ear and throat exam is normal, neck free of mass or lymphadenopathy, chest clear, no wheezing, crepitations, rhonchi, normal symmetric air entry   Abdomen: soft, non-tender; bowel sounds normal; no masses,  no organomegaly    Urinary: urethral meatus normal      Assessment:    Pregnancy: G1P0000 Patient Active Problem List  Diagnosis  . Supervision of normal first pregnancy  . Asthma  . Obesity  . Family history of breast cancer in mother  . Current smoker        Plan:    Rx Percocet #15 for breakthrough H/A pain. Follow up next visit. Initial labs drawn. Prenatal vitamins. Problem list reviewed and updated. Genetic Screening discussed Quad Screen: requested.  Ultrasound discussed; fetal survey: requested.  Follow up in 4 weeks. 50% of 30 min visit spent on counseling and coordination of care.  [email or web address] and AVS on smoking cessation   Frances Murphy 11/02/2012

## 2012-11-02 NOTE — Addendum Note (Signed)
Addended by: Caren Griffins C on: 11/02/2012 12:01 PM   Modules accepted: Orders

## 2012-11-02 NOTE — Addendum Note (Signed)
Addended by: Faythe Casa on: 11/02/2012 11:44 AM   Modules accepted: Orders

## 2012-11-02 NOTE — Patient Instructions (Addendum)
Pregnancy - First Trimester During sexual intercourse, millions of sperm go into the vagina. Only 1 sperm will penetrate and fertilize the female egg while it is in the Fallopian tube. One week later, the fertilized egg implants into the wall of the uterus. An embryo begins to develop into a baby. At 6 to 8 weeks, the eyes and face are formed and the heartbeat can be seen on ultrasound. At the end of 12 weeks (first trimester), all the baby's organs are formed. Now that you are pregnant, you will want to do everything you can to have a healthy baby. Two of the most important things are to get good prenatal care and follow your caregiver's instructions. Prenatal care is all the medical care you receive before the baby's birth. It is given to prevent, find, and treat problems during the pregnancy and childbirth. PRENATAL EXAMS  During prenatal visits, your weight, blood pressure and urine are checked. This is done to make sure you are healthy and progressing normally during the pregnancy.  A pregnant woman should gain 25 to 35 pounds during the pregnancy. However, if you are over weight or underweight, your caregiver will advise you regarding your weight.  Your caregiver will ask and answer questions for you.  Blood work, cervical cultures, other necessary tests and a Pap test are done during your prenatal exams. These tests are done to check on your health and the probable health of your baby. Tests are strongly recommended and done for HIV with your permission. This is the virus that causes AIDS. These tests are done because medications can be given to help prevent your baby from being born with this infection should you have been infected without knowing it. Blood work is also used to find out your blood type, previous infections and follow your blood levels (hemoglobin).  Low hemoglobin (anemia) is common during pregnancy. Iron and vitamins are given to help prevent this. Later in the pregnancy, blood  tests for diabetes will be done along with any other tests if any problems develop. You may need tests to make sure you and the baby are doing well.  You may need other tests to make sure you and the baby are doing well. CHANGES DURING THE FIRST TRIMESTER (THE FIRST 3 MONTHS OF PREGNANCY) Your body goes through many changes during pregnancy. They vary from person to person. Talk to your caregiver about changes you notice and are concerned about. Changes can include:  Your menstrual period stops.  The egg and sperm carry the genes that determine what you look like. Genes from you and your partner are forming a baby. The female genes determine whether the baby is a boy or a girl.  Your body increases in girth and you may feel bloated.  Feeling sick to your stomach (nauseous) and throwing up (vomiting). If the vomiting is uncontrollable, call your caregiver.  Your breasts will begin to enlarge and become tender.  Your nipples may stick out more and become darker.  The need to urinate more. Painful urination may mean you have a bladder infection.  Tiring easily.  Loss of appetite.  Cravings for certain kinds of food.  At first, you may gain or lose a couple of pounds.  You may have changes in your emotions from day to day (excited to be pregnant or concerned something may go wrong with the pregnancy and baby).  You may have more vivid and strange dreams. HOME CARE INSTRUCTIONS   It is very important   to avoid all smoking, alcohol and un-prescribed drugs during your pregnancy. These affect the formation and growth of the baby. Avoid chemicals while pregnant to ensure the delivery of a healthy infant.  Start your prenatal visits by the 12th week of pregnancy. They are usually scheduled monthly at first, then more often in the last 2 months before delivery. Keep your caregiver's appointments. Follow your caregiver's instructions regarding medication use, blood and lab tests, exercise, and  diet.  During pregnancy, you are providing food for you and your baby. Eat regular, well-balanced meals. Choose foods such as meat, fish, milk and other low fat dairy products, vegetables, fruits, and whole-grain breads and cereals. Your caregiver will tell you of the ideal weight gain.  You can help morning sickness by keeping soda crackers at the bedside. Eat a couple before arising in the morning. You may want to use the crackers without salt on them.  Eating 4 to 5 small meals rather than 3 large meals a day also may help the nausea and vomiting.  Drinking liquids between meals instead of during meals also seems to help nausea and vomiting.  A physical sexual relationship may be continued throughout pregnancy if there are no other problems. Problems may be early (premature) leaking of amniotic fluid from the membranes, vaginal bleeding, or belly (abdominal) pain.  Exercise regularly if there are no restrictions. Check with your caregiver or physical therapist if you are unsure of the safety of some of your exercises. Greater weight gain will occur in the last 2 trimesters of pregnancy. Exercising will help:  Control your weight.  Keep you in shape.  Prepare you for labor and delivery.  Help you lose your pregnancy weight after you deliver your baby.  Wear a good support or jogging bra for breast tenderness during pregnancy. This may help if worn during sleep too.  Ask when prenatal classes are available. Begin classes when they are offered.  Do not use hot tubs, steam rooms or saunas.  Wear your seat belt when driving. This protects you and your baby if you are in an accident.  Avoid raw meat, uncooked cheese, cat litter boxes and soil used by cats throughout the pregnancy. These carry germs that can cause birth defects in the baby.  The first trimester is a good time to visit your dentist for your dental health. Getting your teeth cleaned is OK. Use a softer toothbrush and brush  gently during pregnancy.  Ask for help if you have financial, counseling or nutritional needs during pregnancy. Your caregiver will be able to offer counseling for these needs as well as refer you for other special needs.  Do not take any medications or herbs unless told by your caregiver.  Inform your caregiver if there is any mental or physical domestic violence.  Make a list of emergency phone numbers of family, friends, hospital, and police and fire departments.  Write down your questions. Take them to your prenatal visit.  Do not douche.  Do not cross your legs.  If you have to stand for long periods of time, rotate you feet or take small steps in a circle.  You may have more vaginal secretions that may require a sanitary pad. Do not use tampons or scented sanitary pads. MEDICATIONS AND DRUG USE IN PREGNANCY  Take prenatal vitamins as directed. The vitamin should contain 1 milligram of folic acid. Keep all vitamins out of reach of children. Only a couple vitamins or tablets containing iron may be   fatal to a baby or young child when ingested.  Avoid use of all medications, including herbs, over-the-counter medications, not prescribed or suggested by your caregiver. Only take over-the-counter or prescription medicines for pain, discomfort, or fever as directed by your caregiver. Do not use aspirin, ibuprofen, or naproxen unless directed by your caregiver.  Let your caregiver also know about herbs you may be using.  Alcohol is related to a number of birth defects. This includes fetal alcohol syndrome. All alcohol, in any form, should be avoided completely. Smoking will cause low birth rate and premature babies.  Street or illegal drugs are very harmful to the baby. They are absolutely forbidden. A baby born to an addicted mother will be addicted at birth. The baby will go through the same withdrawal an adult does.  Let your caregiver know about any medications that you have to take  and for what reason you take them. MISCARRIAGE IS COMMON DURING PREGNANCY A miscarriage does not mean you did something wrong. It is not a reason to worry about getting pregnant again. Your caregiver will help you with questions you may have. If you have a miscarriage, you may need minor surgery. SEEK MEDICAL CARE IF:  You have any concerns or worries during your pregnancy. It is better to call with your questions if you feel they cannot wait, rather than worry about them. SEEK IMMEDIATE MEDICAL CARE IF:   An unexplained oral temperature above 102 F (38.9 C) develops, or as your caregiver suggests.  You have leaking of fluid from the vagina (birth canal). If leaking membranes are suspected, take your temperature and inform your caregiver of this when you call.  There is vaginal spotting or bleeding. Notify your caregiver of the amount and how many pads are used.  You develop a bad smelling vaginal discharge with a change in the color.  You continue to feel sick to your stomach (nauseated) and have no relief from remedies suggested. You vomit blood or coffee ground-like materials.  You lose more than 2 pounds of weight in 1 week.  You gain more than 2 pounds of weight in 1 week and you notice swelling of your face, hands, feet, or legs.  You gain 5 pounds or more in 1 week (even if you do not have swelling of your hands, face, legs, or feet).  You get exposed to [place] measles and have never had them.  You are exposed to fifth disease or chickenpox.  You develop belly (abdominal) pain. Round ligament discomfort is a common non-cancerous (benign) cause of abdominal pain in pregnancy. Your caregiver still must evaluate this.  You develop headache, fever, diarrhea, pain with urination, or shortness of breath.  You fall or are in a car accident or have any kind of trauma.  There is mental or physical violence in your home. Document Released: 07/14/2001 Document Revised: 10/12/2011  Document Reviewed: 01/15/2009 [facility] Patient Information 2013 [place], [place]. Smoking Cessation Quitting smoking is important to your health and has many advantages. However, it is not always easy to quit since nicotine is a very addictive drug. Often times, people try 3 times or more before being able to quit. This document explains the best ways for you to prepare to quit smoking. Quitting takes hard work and a lot of effort, but you can do it. ADVANTAGES OF QUITTING SMOKING  You will live longer, feel better, and live better.  Your body will feel the impact of quitting smoking almost immediately.  Within 20  minutes, blood pressure decreases. Your pulse returns to its normal level.  After 8 hours, carbon monoxide levels in the blood return to normal. Your oxygen level increases.  After 24 hours, the chance of having a heart attack starts to decrease. Your breath, hair, and body stop smelling like smoke.  After 48 hours, damaged nerve endings begin to recover. Your sense of taste and smell improve.  After 72 hours, the body is virtually free of nicotine. Your bronchial tubes relax and breathing becomes easier.  After 2 to 12 weeks, lungs can hold more air. Exercise becomes easier and circulation improves.  The risk of having a heart attack, stroke, cancer, or lung disease is greatly reduced.  After 1 year, the risk of coronary heart disease is cut in half.  After 5 years, the risk of stroke falls to the same as a nonsmoker.  After 10 years, the risk of lung cancer is cut in half and the risk of other cancers decreases significantly.  After 15 years, the risk of coronary heart disease drops, usually to the level of a nonsmoker.  If you are pregnant, quitting smoking will improve your chances of having a healthy baby.  The people you live with, especially any children, will be healthier.  You will have extra money to spend on things other than cigarettes. QUESTIONS TO THINK  ABOUT BEFORE ATTEMPTING TO QUIT You may want to talk about your answers with your caregiver.  Why do you want to quit?  If you tried to quit in the past, what helped and what did not?  What will be the most difficult situations for you after you quit? How will you plan to handle them?  Who can help you through the tough times? Your family? Friends? A caregiver?  What pleasures do you get from smoking? What ways can you still get pleasure if you quit? Here are some questions to ask your caregiver:  How can you help me to be successful at quitting?  What medicine do you think would be best for me and how should I take it?  What should I do if I need more help?  What is smoking withdrawal like? How can I get information on withdrawal? GET READY  Set a quit date.  Change your environment by getting rid of all cigarettes, ashtrays, matches, and lighters in your home, car, or work. Do not let people smoke in your home.  Review your past attempts to quit. Think about what worked and what did not. GET SUPPORT AND ENCOURAGEMENT You have a better chance of being successful if you have help. You can get support in many ways.  Tell your family, friends, and co-workers that you are going to quit and need their support. Ask them not to smoke around you.  Get individual, group, or telephone counseling and support. Programs are available at [employer] and health centers. Call your local health department for information about programs in your area.  Spiritual beliefs and practices may help some smokers quit.  Download a "quit meter" on your computer to keep track of quit statistics, such as how long you have gone without smoking, cigarettes not smoked, and money saved.  Get a self-help book about quitting smoking and staying off of tobacco. LEARN NEW SKILLS AND BEHAVIORS  Distract yourself from urges to smoke. Talk to someone, go for a walk, or occupy your time with a task.  Change  your normal routine. Take a different route to work. Drink  tea instead of coffee. Eat breakfast in a different place.  Reduce your stress. Take a hot bath, exercise, or read a book.  Plan something enjoyable to do every day. Reward yourself for not smoking.  Explore interactive web-based programs that specialize in helping you quit. GET MEDICINE AND USE IT CORRECTLY Medicines can help you stop smoking and decrease the urge to smoke. Combining medicine with the above behavioral methods and support can greatly increase your chances of successfully quitting smoking.  Nicotine replacement therapy helps deliver nicotine to your body without the negative effects and risks of smoking. Nicotine replacement therapy includes nicotine gum, lozenges, inhalers, nasal sprays, and skin patches. Some may be available over-the-counter and others require a prescription.  Antidepressant medicine helps people abstain from smoking, but how this works is unknown. This medicine is available by prescription.  Nicotinic receptor partial agonist medicine simulates the effect of nicotine in your brain. This medicine is available by prescription. Ask your caregiver for advice about which medicines to use and how to use them based on your health history. Your caregiver will tell you what side effects to look out for if you choose to be on a medicine or therapy. Carefully read the information on the package. Do not use any other product containing nicotine while using a nicotine replacement product.  RELAPSE OR DIFFICULT SITUATIONS Most relapses occur within the first 3 months after quitting. Do not be discouraged if you start smoking again. Remember, most people try several times before finally quitting. You may have symptoms of withdrawal because your body is used to nicotine. You may crave cigarettes, be irritable, feel very hungry, cough often, get headaches, or have difficulty concentrating. The withdrawal symptoms are only  temporary. They are strongest when you first quit, but they will go away within 10 14 days. To reduce the chances of relapse, try to:  Avoid drinking alcohol. Drinking lowers your chances of successfully quitting.  Reduce the amount of caffeine you consume. Once you quit smoking, the amount of caffeine in your body increases and can give you symptoms, such as a rapid heartbeat, sweating, and anxiety.  Avoid smokers because they can make you want to smoke.  Do not let weight gain distract you. Many smokers will gain weight when they quit, usually less than 10 pounds. Eat a healthy diet and stay active. You can always lose the weight gained after you quit.  Find ways to improve your mood other than smoking. FOR MORE INFORMATION  www.smokefree.gov  Document Released: 07/14/2001 Document Revised: 01/19/2012 Document Reviewed: 10/29/2011 [facility] Patient Information 2013 [place], [place].

## 2012-11-02 NOTE — Progress Notes (Signed)
Pulse- 80 Patient reports a lot of cramping

## 2012-11-03 LAB — OBSTETRIC PANEL
Eosinophils Absolute: 0.2 10*3/uL (ref 0.0–0.7)
Eosinophils Relative: 2 % (ref 0–5)
HCT: 37.9 % (ref 36.0–46.0)
Hemoglobin: 12.7 g/dL (ref 12.0–15.0)
Lymphocytes Relative: 31 % (ref 12–46)
Lymphs Abs: 3.5 10*3/uL (ref 0.7–4.0)
MCH: 28.2 pg (ref 26.0–34.0)
MCV: 84 fL (ref 78.0–100.0)
Monocytes Absolute: 0.9 10*3/uL (ref 0.1–1.0)
Monocytes Relative: 8 % (ref 3–12)
RBC: 4.51 MIL/uL (ref 3.87–5.11)
Rh Type: POSITIVE
Rubella: 6.42 Index — ABNORMAL HIGH (ref <0.90)
WBC: 11.6 10*3/uL — ABNORMAL HIGH (ref 4.0–10.5)

## 2012-11-03 LAB — GLUCOSE TOLERANCE, 1 HOUR (50G) W/O FASTING: Glucose, 1 Hour GTT: 90 mg/dL (ref 70–140)

## 2012-11-04 LAB — HEMOGLOBINOPATHY EVALUATION
Hemoglobin Other: 0 %
Hgb A: 56.1 % — ABNORMAL LOW (ref 96.8–97.8)

## 2012-11-20 DIAGNOSIS — D573 Sickle-cell trait: Secondary | ICD-10-CM | POA: Insufficient documentation

## 2012-11-30 ENCOUNTER — Ambulatory Visit (INDEPENDENT_AMBULATORY_CARE_PROVIDER_SITE_OTHER): Payer: Medicaid Other | Admitting: Obstetrics and Gynecology

## 2012-11-30 VITALS — BP 130/78 | Temp 97.9°F | Wt 254.5 lb

## 2012-11-30 DIAGNOSIS — Z3400 Encounter for supervision of normal first pregnancy, unspecified trimester: Secondary | ICD-10-CM

## 2012-11-30 DIAGNOSIS — Z3402 Encounter for supervision of normal first pregnancy, second trimester: Secondary | ICD-10-CM

## 2012-11-30 DIAGNOSIS — R21 Rash and other nonspecific skin eruption: Secondary | ICD-10-CM

## 2012-11-30 LAB — POCT URINALYSIS DIP (DEVICE)
Bilirubin Urine: NEGATIVE
Glucose, UA: NEGATIVE mg/dL
Nitrite: NEGATIVE
Specific Gravity, Urine: 1.015 (ref 1.005–1.030)
Urobilinogen, UA: 1 mg/dL (ref 0.0–1.0)

## 2012-11-30 MED ORDER — TRIAMCINOLONE ACETONIDE 0.025 % EX OINT: TOPICAL_OINTMENT | Freq: Two times a day (BID) | CUTANEOUS | Status: DC

## 2012-11-30 NOTE — Progress Notes (Signed)
Informal [place] for viability- +cardiac- 154 per m-mode, +fetal movement. Results reported to Deirdre. US anatomy scheduled

## 2012-11-30 NOTE — Progress Notes (Signed)
Pulse- 94 

## 2012-12-21 ENCOUNTER — Ambulatory Visit (HOSPITAL_COMMUNITY)
Admission: RE | Admit: 2012-12-21 | Discharge: 2012-12-21 | Disposition: A | Discharge status: Home / Self Care | Payer: Medicaid Other | Source: Ambulatory Visit | Attending: Obstetrics and Gynecology | Admitting: Obstetrics and Gynecology

## 2012-12-21 DIAGNOSIS — Z3402 Encounter for supervision of normal first pregnancy, second trimester: Secondary | ICD-10-CM

## 2012-12-21 DIAGNOSIS — Z3689 Encounter for other specified antenatal screening: Secondary | ICD-10-CM | POA: Insufficient documentation

## 2012-12-28 ENCOUNTER — Ambulatory Visit (INDEPENDENT_AMBULATORY_CARE_PROVIDER_SITE_OTHER): Payer: Medicaid Other | Admitting: Family Medicine

## 2012-12-28 VITALS — BP 111/74 | Temp 97.7°F | Wt 253.6 lb

## 2012-12-28 DIAGNOSIS — Z803 Family history of malignant neoplasm of breast: Secondary | ICD-10-CM

## 2012-12-28 DIAGNOSIS — Z3401 Encounter for supervision of normal first pregnancy, first trimester: Secondary | ICD-10-CM

## 2012-12-28 DIAGNOSIS — E669 Obesity, unspecified: Secondary | ICD-10-CM

## 2012-12-28 DIAGNOSIS — B356 Tinea cruris: Secondary | ICD-10-CM

## 2012-12-28 DIAGNOSIS — Z3402 Encounter for supervision of normal first pregnancy, second trimester: Secondary | ICD-10-CM

## 2012-12-28 DIAGNOSIS — F17200 Nicotine dependence, unspecified, uncomplicated: Secondary | ICD-10-CM

## 2012-12-28 DIAGNOSIS — O99330 Smoking (tobacco) complicating pregnancy, unspecified trimester: Secondary | ICD-10-CM

## 2012-12-28 LAB — POCT URINALYSIS DIP (DEVICE)
Glucose, UA: NEGATIVE mg/dL
Nitrite: NEGATIVE

## 2012-12-28 MED ORDER — PRENATAL PLUS 27-1 MG PO TABS: 1.0000 | ORAL_TABLET | Freq: Every day | ORAL | Status: DC

## 2012-12-28 MED ORDER — CLOTRIMAZOLE-BETAMETHASONE 1-0.05 % EX CREA: TOPICAL_CREAM | Freq: Two times a day (BID) | CUTANEOUS | Status: DC

## 2012-12-28 MED ORDER — FLUCONAZOLE 150 MG PO TABS: 150.0000 mg | ORAL_TABLET | Freq: Once | ORAL | Status: DC

## 2012-12-28 NOTE — Progress Notes (Signed)
Pulse- 96 Patient reports pelvic pressure

## 2012-12-28 NOTE — Patient Instructions (Addendum)
Fungus Infection of the Skin An infection of your skin caused by a fungus is a very common problem. Treatment depends on which part of the body is affected. Types of fungal skin infection include:  Athlete's Foot(Tinea pedis). This infection starts between the toes and may involve the entire sole and sides of foot. It is the most common fungal disease. It is made worse by heat, moisture, and friction. To treat, wash your feet 2 to 3 times daily. Dry thoroughly between the toes. Use medicated foot powder or cream as directed on the package. Plain talc, cornstarch, or rice powder may be dusted into socks and shoes to keep the feet dry. Wearing footwear that allows ventilation is also helpful.  Ringworm (Tinea corporis and tinea capitis). This infection causes scaly red rings to form on the skin or scalp. For skin sores, apply medicated lotion or cream as directed on the package. For the scalp, medicated shampoo may be used with with other therapies. Ringworm of the scalp or fingernails usually requires using oral medicine for 2 to 4 months.  Tinea versicolor. This infection appears as painless, scaly, patchy areas of discolored skin (whitish to light brown). It is more common in the summer and favors oily areas of the skin such as those found at the chest, abdomen, back, pubis, neck, and body folds. It can be treated with medicated shampoo or with medicated topical cream. Oral antifungals may be needed for more active infections. The light and/or dark spots may take time to get better and is not a sign of treatment failure. Fungal infections may need to be treated for several weeks to be cured. It is important not to treat fungal infections with steroids or combination medicine that contains an antifungal and steroid as these will make the fungal infection worse. SEEK MEDICAL CARE IF:   You have persistent itching or rawness.  You have an oral temperature above 102 F (38.9 C). Document Released:  08/27/2004 Document Revised: 10/12/2011 Document Reviewed: 11/12/2009 [facility] Patient Information 2013 [place], [place].   Candidal Vulvovaginitis Candidal vulvovaginitis is an infection of the vagina and vulva. The vulva is the skin around the opening of the vagina. This may cause itching and discomfort in and around the vagina.  HOME CARE  Only take medicine as told by your doctor.  Do not have sex (intercourse) until the infection is healed or as told by your doctor.  Practice safe sex.  Tell your sex partner about your infection.  Do not douche or use tampons.  Wear cotton underwear. Do not wear tight pants or panty hose.  Eat yogurt. This may help treat and prevent yeast infections. GET HELP RIGHT AWAY IF:   You have a fever.  Your problems get worse during treatment or do not get better in 3 days.  You have discomfort, irritation, or itching in your vagina or vulva area.  You have pain after sex.  You start to get belly (abdominal) pain. MAKE SURE YOU:  Understand these instructions.  Will watch your condition.  Will get help right away if you are not doing well or get worse. Document Released: 10/16/2008 Document Revised: 10/12/2011 Document Reviewed: 10/16/2008 [facility] Patient Information 2014 [place], [place].   Pregnancy - Second Trimester The second trimester of pregnancy (3 to 6 months) is a period of rapid growth for you and your baby. At the end of the sixth month, your baby is about 9 inches long and weighs 1 1/2 pounds. You will begin to  feel the baby move between 18 and 20 weeks of the pregnancy. This is called quickening. Weight gain is faster. A clear fluid (colostrum) may leak out of your breasts. You may feel small contractions of the womb (uterus). This is known as false labor or Braxton-Hicks contractions. This is like a practice for labor when the baby is ready to be born. Usually, the problems with morning sickness have usually passed by the end of  your first trimester. Some women develop small dark blotches (called cholasma, mask of pregnancy) on their face that usually goes away after the baby is born. Exposure to the sun makes the blotches worse. Acne may also develop in some pregnant women and pregnant women who have acne, may find that it goes away. PRENATAL EXAMS  Blood work may continue to be done during prenatal exams. These tests are done to check on your health and the probable health of your baby. Blood work is used to follow your blood levels (hemoglobin). Anemia (low hemoglobin) is common during pregnancy. Iron and vitamins are given to help prevent this. You will also be checked for diabetes between 24 and 28 weeks of the pregnancy. Some of the previous blood tests may be repeated.  The size of the uterus is measured during each visit. This is to make sure that the baby is continuing to grow properly according to the dates of the pregnancy.  Your blood pressure is checked every prenatal visit. This is to make sure you are not getting toxemia.  Your urine is checked to make sure you do not have an infection, diabetes or protein in the urine.  Your weight is checked often to make sure gains are happening at the suggested rate. This is to ensure that both you and your baby are growing normally.  Sometimes, an ultrasound is performed to confirm the proper growth and development of the baby. This is a test which bounces harmless sound waves off the baby so your caregiver can more accurately determine due dates. Sometimes, a test is done on the amniotic fluid surrounding the baby. This test is called an amniocentesis. The amniotic fluid is obtained by sticking a needle into the belly (abdomen). This is done to check the chromosomes in instances where there is a concern about possible genetic problems with the baby. It is also sometimes done near the end of pregnancy if an early delivery is required. In this case, it is done to help make  sure the baby's lungs are mature enough for the baby to live outside of the womb. CHANGES OCCURING IN THE SECOND TRIMESTER OF PREGNANCY Your body goes through many changes during pregnancy. They vary from person to person. Talk to your caregiver about changes you notice that you are concerned about.  During the second trimester, you will likely have an increase in your appetite. It is normal to have cravings for certain foods. This varies from person to person and pregnancy to pregnancy.  Your lower abdomen will begin to bulge.  You may have to urinate more often because the uterus and baby are pressing on your bladder. It is also common to get more bladder infections during pregnancy. You can help this by drinking lots of fluids and emptying your bladder before and after intercourse.  You may begin to get stretch marks on your hips, abdomen, and breasts. These are normal changes in the body during pregnancy. There are no exercises or medicines to take that prevent this change.  You may  begin to develop swollen and bulging veins (varicose veins) in your legs. Wearing support hose, elevating your feet for 15 minutes, 3 to 4 times a day and limiting salt in your diet helps lessen the problem.  Heartburn may develop as the uterus grows and pushes up against the stomach. Antacids recommended by your caregiver helps with this problem. Also, eating smaller meals 4 to 5 times a day helps.  Constipation can be treated with a stool softener or adding bulk to your diet. Drinking lots of fluids, and eating vegetables, fruits, and whole grains are helpful.  Exercising is also helpful. If you have been very active up until your pregnancy, most of these activities can be continued during your pregnancy. If you have been less active, it is helpful to start an exercise program such as walking.  Hemorrhoids may develop at the end of the second trimester. Warm sitz baths and hemorrhoid cream recommended by your  caregiver helps hemorrhoid problems.  Backaches may develop during this time of your pregnancy. Avoid heavy lifting, wear low heal shoes, and practice good posture to help with backache problems.  Some pregnant women develop tingling and numbness of their hand and fingers because of swelling and tightening of ligaments in the wrist (carpel tunnel syndrome). This goes away after the baby is born.  As your breasts enlarge, you may have to get a bigger bra. Get a comfortable, cotton, support bra. Do not get a nursing bra until the last month of the pregnancy if you will be nursing the baby.  You may get a dark line from your belly button to the pubic area called the linea nigra.  You may develop rosy cheeks because of increase blood flow to the face.  You may develop spider looking lines of the face, neck, arms, and chest. These go away after the baby is born. HOME CARE INSTRUCTIONS   It is extremely important to avoid all smoking, herbs, alcohol, and unprescribed drugs during your pregnancy. These chemicals affect the formation and growth of the baby. Avoid these chemicals throughout the pregnancy to ensure the delivery of a healthy infant.  Most of your home care instructions are the same as suggested for the first trimester of your pregnancy. Keep your caregiver's appointments. Follow your caregiver's instructions regarding medicine use, exercise, and diet.  During pregnancy, you are providing food for you and your baby. Continue to eat regular, well-balanced meals. Choose foods such as meat, fish, milk and other low fat dairy products, vegetables, fruits, and whole-grain breads and cereals. Your caregiver will tell you of the ideal weight gain.  A physical sexual relationship may be continued up until near the end of pregnancy if there are no other problems. Problems could include early (premature) leaking of amniotic fluid from the membranes, vaginal bleeding, abdominal pain, or other medical  or pregnancy problems.  Exercise regularly if there are no restrictions. Check with your caregiver if you are unsure of the safety of some of your exercises. The greatest weight gain will occur in the last 2 trimesters of pregnancy. Exercise will help you:  Control your weight.  Get you in shape for labor and delivery.  Lose weight after you have the baby.  Wear a good support or jogging bra for breast tenderness during pregnancy. This may help if worn during sleep. Pads or tissues may be used in the bra if you are leaking colostrum.  Do not use hot tubs, steam rooms or saunas throughout the pregnancy.  Wear your seat belt at all times when driving. This protects you and your baby if you are in an accident.  Avoid raw meat, uncooked cheese, cat litter boxes, and soil used by cats. These carry germs that can cause birth defects in the baby.  The second trimester is also a good time to visit your dentist for your dental health if this has not been done yet. Getting your teeth cleaned is okay. Use a soft toothbrush. Brush gently during pregnancy.  It is easier to leak urine during pregnancy. Tightening up and strengthening the pelvic muscles will help with this problem. Practice stopping your urination while you are going to the bathroom. These are the same muscles you need to strengthen. It is also the muscles you would use as if you were trying to stop from passing gas. You can practice tightening these muscles up 10 times a set and repeating this about 3 times per day. Once you know what muscles to tighten up, do not perform these exercises during urination. It is more likely to contribute to an infection by backing up the urine.  Ask for help if you have financial, counseling, or nutritional needs during pregnancy. Your caregiver will be able to offer counseling for these needs as well as refer you for other special needs.  Your skin may become oily. If so, wash your face with mild soap, use  non-greasy moisturizer and oil or cream based makeup. MEDICINES AND DRUG USE IN PREGNANCY  Take prenatal vitamins as directed. The vitamin should contain 1 milligram of folic acid. Keep all vitamins out of reach of children. Only a couple vitamins or tablets containing iron may be fatal to a baby or young child when ingested.  Avoid use of all medicines, including herbs, over-the-counter medicines, not prescribed or suggested by your caregiver. Only take over-the-counter or prescription medicines for pain, discomfort, or fever as directed by your caregiver. Do not use aspirin.  Let your caregiver also know about herbs you may be using.  Alcohol is related to a number of birth defects. This includes fetal alcohol syndrome. All alcohol, in any form, should be avoided completely. Smoking will cause low birth rate and premature babies.  Street or illegal drugs are very harmful to the baby. They are absolutely forbidden. A baby born to an addicted mother will be addicted at birth. The baby will go through the same withdrawal an adult does. SEEK MEDICAL CARE IF:  You have any concerns or worries during your pregnancy. It is better to call with your questions if you feel they cannot wait, rather than worry about them. SEEK IMMEDIATE MEDICAL CARE IF:   An unexplained oral temperature above 102 F (38.9 C) develops, or as your caregiver suggests.  You have leaking of fluid from the vagina (birth canal). If leaking membranes are suspected, take your temperature and tell your caregiver of this when you call.  There is vaginal spotting, bleeding, or passing clots. Tell your caregiver of the amount and how many pads are used. Light spotting in pregnancy is common, especially following intercourse.  You develop a bad smelling vaginal discharge with a change in the color from clear to white.  You continue to feel sick to your stomach (nauseated) and have no relief from remedies suggested. You vomit blood  or coffee ground-like materials.  You lose more than 2 pounds of weight or gain more than 2 pounds of weight over 1 week, or as suggested by your caregiver.  You notice swelling of your face, hands, feet, or legs.  You get exposed to [place] measles and have never had them.  You are exposed to fifth disease or chickenpox.  You develop belly (abdominal) pain. Round ligament discomfort is a common non-cancerous (benign) cause of abdominal pain in pregnancy. Your caregiver still must evaluate you.  You develop a bad headache that does not go away.  You develop fever, diarrhea, pain with urination, or shortness of breath.  You develop visual problems, blurry, or double vision.  You fall or are in a car accident or any kind of trauma.  There is mental or physical violence at home. Document Released: 07/14/2001 Document Revised: 04/13/2012 Document Reviewed: 01/16/2009 [facility] Patient Information 2014 [place], [place].

## 2012-12-28 NOTE — Progress Notes (Signed)
Itching in groin area - Lotrimin. Yeast on pap smear - diflucan. Refill PNV.  Declined quad screen.

## 2013-01-25 ENCOUNTER — Encounter: Payer: Medicaid Other | Admitting: Advanced Practice Midwife

## 2013-02-01 DIAGNOSIS — O42919 Preterm premature rupture of membranes, unspecified as to length of time between rupture and onset of labor, unspecified trimester: Secondary | ICD-10-CM | POA: Insufficient documentation

## 2013-02-08 ENCOUNTER — Encounter: Payer: Medicaid Other | Admitting: Obstetrics and Gynecology

## 2013-04-05 ENCOUNTER — Encounter: Payer: Self-pay | Admitting: *Deleted

## 2013-04-13 ENCOUNTER — Encounter: Payer: Self-pay | Admitting: Obstetrics & Gynecology

## 2013-04-13 ENCOUNTER — Ambulatory Visit (INDEPENDENT_AMBULATORY_CARE_PROVIDER_SITE_OTHER): Payer: Medicaid Other | Admitting: Obstetrics & Gynecology

## 2013-04-13 NOTE — Patient Instructions (Addendum)
Contraception Choices  Contraception (birth control) is the use of any methods or devices to prevent pregnancy. Below are some methods to help avoid pregnancy.  HORMONAL METHODS   · Contraceptive implant. This is a thin, plastic tube containing progesterone hormone. It does not contain estrogen hormone. Your caregiver inserts the tube in the inner part of the upper arm. The tube can remain in place for up to 3 years. After 3 years, the implant must be removed. The implant prevents the ovaries from releasing an egg (ovulation), thickens the cervical mucus which prevents sperm from entering the uterus, and thins the lining of the inside of the uterus.  · Progesterone-only injections. These injections are given every 3 months by your caregiver to prevent pregnancy. This synthetic progesterone hormone stops the ovaries from releasing eggs. It also thickens cervical mucus and changes the uterine lining. This makes it harder for sperm to survive in the uterus.  · Birth control pills. These pills contain estrogen and progesterone hormone. They work by stopping the egg from forming in the ovary (ovulation). Birth control pills are prescribed by a caregiver. Birth control pills can also be used to treat heavy periods.  · Minipill. This type of birth control pill contains only the progesterone hormone. They are taken every day of each month and must be prescribed by your caregiver.  · Birth control patch. The patch contains hormones similar to those in birth control pills. It must be changed once a week and is prescribed by a caregiver.  · Vaginal ring. The ring contains hormones similar to those in birth control pills. It is left in the vagina for 3 weeks, removed for 1 week, and then a new one is put back in place. The patient must be comfortable inserting and removing the ring from the vagina. A caregiver's prescription is necessary.  · Emergency contraception. Emergency contraceptives prevent pregnancy after unprotected  sexual intercourse. This pill can be taken right after sex or up to 5 days after unprotected sex. It is most effective the sooner you take the pills after having sexual intercourse. Emergency contraceptive pills are available without a prescription. Check with your pharmacist. Do not use emergency contraception as your only form of birth control.  BARRIER METHODS   · Female condom. This is a thin sheath (latex or rubber) that is worn over the penis during sexual intercourse. It can be used with spermicide to increase effectiveness.  · Female condom. This is a soft, loose-fitting sheath that is put into the vagina before sexual intercourse.  · Diaphragm. This is a soft, latex, dome-shaped barrier that must be fitted by a caregiver. It is inserted into the vagina, along with a spermicidal jelly. It is inserted before intercourse. The diaphragm should be left in the vagina for 6 to 8 hours after intercourse.  · Cervical cap. This is a round, soft, latex or plastic cup that fits over the cervix and must be fitted by a caregiver. The cap can be left in place for up to 48 hours after intercourse.  · Sponge. This is a soft, circular piece of polyurethane foam. The sponge has spermicide in it. It is inserted into the vagina after wetting it and before sexual intercourse.  · Spermicides. These are chemicals that kill or block sperm from entering the cervix and uterus. They come in the form of creams, jellies, suppositories, foam, or tablets. They do not require a prescription. They are inserted into the vagina with an applicator before having sexual intercourse.   The process must be repeated every time you have sexual intercourse.  INTRAUTERINE CONTRACEPTION  · Intrauterine device (IUD). This is a T-shaped device that is put in a woman's uterus during a menstrual period to prevent pregnancy. There are 2 types:  · Copper IUD. This type of IUD is wrapped in copper wire and is placed inside the uterus. Copper makes the uterus and  fallopian tubes produce a fluid that kills sperm. It can stay in place for 10 years.  · Hormone IUD. This type of IUD contains the hormone progestin (synthetic progesterone). The hormone thickens the cervical mucus and prevents sperm from entering the uterus, and it also thins the uterine lining to prevent implantation of a fertilized egg. The hormone can weaken or kill the sperm that get into the uterus. It can stay in place for 5 years.  PERMANENT METHODS OF CONTRACEPTION  · Female tubal ligation. This is when the woman's fallopian tubes are surgically sealed, tied, or blocked to prevent the egg from traveling to the uterus.  · Female sterilization. This is when the female has the tubes that carry sperm tied off (vasectomy). This blocks sperm from entering the vagina during sexual intercourse. After the procedure, the man can still ejaculate fluid (semen).  NATURAL PLANNING METHODS  · Natural family planning. This is not having sexual intercourse or using a barrier method (condom, diaphragm, cervical cap) on days the woman could become pregnant.  · Calendar method. This is keeping track of the length of each menstrual cycle and identifying when you are fertile.  · Ovulation method. This is avoiding sexual intercourse during ovulation.  · Symptothermal method. This is avoiding sexual intercourse during ovulation, using a thermometer and ovulation symptoms.  · Post-ovulation method. This is timing sexual intercourse after you have ovulated.  Regardless of which type or method of contraception you choose, it is important that you use condoms to protect against the transmission of sexually transmitted diseases (STDs). Talk with your caregiver about which form of contraception is most appropriate for you.  Document Released: 07/20/2005 Document Revised: 10/12/2011 Document Reviewed: 11/26/2010  [employer]® Patient Information ©2014 [employer].

## 2013-04-13 NOTE — Progress Notes (Unsigned)
  Subjective:    Patient ID: Frances Murphy, female    DOB: [birth year], 22 y.o.   MRN: [id number]  HPI Delivered in [place] at [facility] at 24 weeks 02/01/13, with neonatal demise at 1 week. Had a postpartum visit in [facility]. Had DMPA postpartum but doesn't want to continue, will use condoms for now.    Review of Systems  Constitutional: Negative for fever.  Genitourinary: Negative for vaginal bleeding, vaginal discharge, menstrual problem and pelvic pain.       Objective:   Physical Exam  Constitutional: She appears well-developed.  obese  Skin: Skin is warm and dry.  Psychiatric: She has a normal mood and affect. Her behavior is normal.          Assessment & Plan:  Postpartum after preterm delivery and neonatal death. May try to conceive again in a few months.  Adam Phenix, MD 04/13/2013

## 2013-04-13 NOTE — Progress Notes (Unsigned)
Here for postpartum visit. States went into preterm labor and delivered at 6 months in [place], [place] . Baby died.

## 2013-06-08 ENCOUNTER — Other Ambulatory Visit: Payer: Self-pay

## 2014-06-04 ENCOUNTER — Encounter: Payer: Self-pay | Admitting: Obstetrics & Gynecology

## 2016-05-09 ENCOUNTER — Encounter (HOSPITAL_COMMUNITY): Payer: Self-pay

## 2016-05-09 ENCOUNTER — Emergency Department (HOSPITAL_COMMUNITY)
Admission: EM | Admit: 2016-05-09 | Discharge: 2016-05-09 | Disposition: A | Discharge status: Home / Self Care | Payer: Medicaid Other | Attending: Emergency Medicine | Admitting: Emergency Medicine

## 2016-05-09 DIAGNOSIS — F17210 Nicotine dependence, cigarettes, uncomplicated: Secondary | ICD-10-CM | POA: Insufficient documentation

## 2016-05-09 DIAGNOSIS — Z7982 Long term (current) use of aspirin: Secondary | ICD-10-CM | POA: Insufficient documentation

## 2016-05-09 DIAGNOSIS — B372 Candidiasis of skin and nail: Secondary | ICD-10-CM | POA: Insufficient documentation

## 2016-05-09 DIAGNOSIS — J45909 Unspecified asthma, uncomplicated: Secondary | ICD-10-CM | POA: Insufficient documentation

## 2016-05-09 LAB — CBG MONITORING, ED: Glucose-Capillary: 93 mg/dL (ref 65–99)

## 2016-05-09 MED ORDER — NYSTATIN 100000 UNIT/GM EX POWD: Freq: Four times a day (QID) | CUTANEOUS | 0 refills | Status: DC

## 2016-05-09 NOTE — Progress Notes (Addendum)
Pt states she has an itchy rash under her armpits and under her breast for awhile.Nothing makes it feel better or worse. (3:20pm)FSBS 93. (4:25pm)

## 2016-05-09 NOTE — ED Triage Notes (Signed)
She c/o "dark" pruritic rash at body "creases" areas "for a while now".  She states she has been prescribed a "powder" for this rash in the past, but did not fill it. She denies fever, nor any other sign of illness.

## 2016-05-09 NOTE — ED Provider Notes (Signed)
[facility]-EMERGENCY DEPT Provider Note   CSN: [id number] Arrival date & time: 05/09/16  1422 By signing my name below, I, Vista Mink, attest that this documentation has been prepared under the direction and in the presence of [employer].  Electronically Signed: Vista Mink, ED Scribe. 05/09/16. 4:38 PM.   History   Chief Complaint Chief Complaint  Patient presents with  . Rash    HPI HPI Comments: Frances Murphy is a 25 y.o. female who presents to the Emergency Department complaining of a constant, pruritic rash underneath her breasts that has been persistent for the past 3 months. Pt states she has been seen for this issue in the past and was told to buy an OTC powder but never did. She denies any drainage from the area. No fever.   The history is provided by the patient. No language interpreter was used.    Past Medical History:  Diagnosis Date  . Asthma     Patient Active Problem List   Diagnosis Date Noted  . Preterm delivery, delivered 04/13/2013  . 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 Asthma 11/02/2012  . Obesity 11/02/2012  . Family history of breast cancer in mother 11/02/2012  . Current smoker 11/02/2012    Past Surgical History:  Procedure Laterality Date  . None      OB History    Gravida Para Term Preterm AB Living   1 1 0 1 0 0   SAB TAB Ectopic Multiple Live Births   0 0 0 0 1       Home Medications    Prior to Admission medications   Medication Sig Start Date End Date Taking? Authorizing Provider  acetaminophen (TYLENOL) 500 MG tablet Take 1,000 mg by mouth every 6 (six) hours as needed for pain.     Historical Provider, MD  albuterol (PROVENTIL HFA;VENTOLIN HFA) 108 (90 BASE) MCG/ACT inhaler Inhale 2 puffs into the lungs every 6 (six) hours as needed for shortness of breath.     Historical Provider, MD  aspirin-acetaminophen-caffeine (EXCEDRIN MIGRAINE) [phone] MG per tablet Take 1 tablet by mouth every 6 (six) hours as needed  for pain.    Historical Provider, MD    Family History Family History  Problem Relation Age of Onset  . Hypertension Mother   . Cancer Mother   . Hypertension Father   . Hypertension Sister   . Hypertension Maternal Grandmother     Social History Social History  Substance Use Topics  . Smoking status: Current Every Day Smoker    Packs/day: 0.25    Years: 7.00    Types: Cigarettes  . Smokeless tobacco: Never Used  . Alcohol use No     Allergies   Review of patient's allergies indicates no known allergies.   Review of Systems Review of Systems  Constitutional: Negative for fever.  Gastrointestinal: Negative for nausea and vomiting.  Endocrine: Negative for polydipsia and polyuria.  Skin: Positive for rash.     Physical Exam Updated Vital Signs BP 133/77 (BP Location: Right Arm)   Pulse 81   Temp 98.5 F (36.9 C) (Oral)   Resp 18   SpO2 97%   Physical Exam  Constitutional: She appears well-developed and well-nourished. No distress.  HENT:  Head: Normocephalic and atraumatic.  Neck: Normal range of motion.  Pulmonary/Chest: Effort normal.  Neurological: She is alert.  Skin: Skin is warm and dry. She is not diaphoretic.  Patient with scaling hyperpigmented lesions beneath and between breasts, groin, consistent  with candidal infection.  Psychiatric: She has a normal mood and affect. Judgment normal.  Nursing note and vitals reviewed.    ED Treatments / Results  DIAGNOSTIC STUDIES: Oxygen Saturation is 97% on RA, normal by my interpretation.  COORDINATION OF CARE: 4:20 PM-Pt instructed to apply Nystatin powder. Discussed treatment plan with pt at bedside and pt agreed to plan. CBG neg.   Patient urged to return with worsening symptoms or other concerns. Patient verbalized understanding and agrees with plan.    Labs (all labs ordered are listed, but only abnormal results are displayed) Labs Reviewed  CBG MONITORING, ED   Procedures Procedures  (including critical care time)   Initial Impression / Assessment and Plan / ED Course  I have reviewed the triage vital signs and the nursing notes.  Pertinent labs & imaging results that were available during my care of the patient were reviewed by me and considered in my medical decision making (see chart for details).  Clinical Course   Vital signs reviewed and are as follows: Vitals:   05/09/16 1427  BP: 133/77  Pulse: 81  Resp: 18  Temp: 98.5 F (36.9 C)     Final Clinical Impressions(s) / ED Diagnoses   Final diagnoses:  Candidiasis, intertrigo   Patient with signs and symptoms consistent with intertrigo. Normal blood sugar. No systemic symptoms of illness.  New Prescriptions Discharge Medication List as of 05/09/2016  4:36 PM    START taking these medications   Details  nystatin (NYSTATIN) powder Apply topically 4 (four) times daily., Starting Sat 05/09/2016, Print      I personally performed the services described in this documentation, which was scribed in my presence. The recorded information has been reviewed and is accurate.     Renne CriglerJoshua Lanesha Azzaro, PA-C 05/09/16 1651    Doug SouSam Jacubowitz, MD 05/09/16 2300

## 2016-05-09 NOTE — Discharge Instructions (Signed)
Please read and follow all provided instructions.  Your diagnoses today include:  1. Candidiasis, intertrigo     Tests performed today include:  Vital signs. See below for your results today.   Medications prescribed:   Nystatin powder - medication for skin yeast infection  Home care instructions:  Follow any educational materials contained in this packet.  Follow-up instructions: Please follow-up with your primary care provider as needed for further evaluation of your symptoms.  Return instructions:   Please return to the Emergency Department if you experience worsening symptoms.   Please return if you have any other emergent concerns.  Additional Information:  Your vital signs today were: BP 133/77 (BP Location: Right Arm)    Pulse 81    Temp 98.5 F (36.9 C) (Oral)    Resp 18    SpO2 97%  If your blood pressure (BP) was elevated above 135/85 this visit, please have this repeated by your doctor within one month. ---------------

## 2016-06-08 ENCOUNTER — Encounter (HOSPITAL_COMMUNITY): Payer: Self-pay | Admitting: Emergency Medicine

## 2016-06-08 ENCOUNTER — Emergency Department (HOSPITAL_COMMUNITY)
Admission: EM | Admit: 2016-06-08 | Discharge: 2016-06-08 | Disposition: A | Discharge status: Home / Self Care | Payer: Medicaid Other | Attending: Emergency Medicine | Admitting: Emergency Medicine

## 2016-06-08 DIAGNOSIS — Z7982 Long term (current) use of aspirin: Secondary | ICD-10-CM | POA: Insufficient documentation

## 2016-06-08 DIAGNOSIS — F17210 Nicotine dependence, cigarettes, uncomplicated: Secondary | ICD-10-CM | POA: Insufficient documentation

## 2016-06-08 DIAGNOSIS — J45909 Unspecified asthma, uncomplicated: Secondary | ICD-10-CM | POA: Insufficient documentation

## 2016-06-08 DIAGNOSIS — G44209 Tension-type headache, unspecified, not intractable: Secondary | ICD-10-CM | POA: Insufficient documentation

## 2016-06-08 DIAGNOSIS — L309 Dermatitis, unspecified: Secondary | ICD-10-CM | POA: Insufficient documentation

## 2016-06-08 MED ORDER — DICLOFENAC SODIUM 25 MG PO TBEC: 25.0000 mg | DELAYED_RELEASE_TABLET | Freq: Two times a day (BID) | ORAL | 0 refills | Status: DC

## 2016-06-08 MED ORDER — HYDROCORTISONE 1 % EX CREA: TOPICAL_CREAM | CUTANEOUS | 0 refills | Status: DC

## 2016-06-08 NOTE — ED Provider Notes (Signed)
[facility]PT Provider Note   CSN: [id number] Arrival date & time: 06/08/16  1816  By signing my name below, I, Placido SouLogan Joldersma, attest that this documentation has been prepared under the direction and in the presence of Felicie Mornavid Dareth Andrew, NP. Electronically Signed: Placido SouLogan Joldersma, ED Scribe. 06/08/16. 7:36 PM.   History   Chief Complaint Chief Complaint  Patient presents with  . Headache  . Cellulitis    breast area    HPI HPI Comments: Frances Murphy is a 25 y.o. female who presents to the Emergency Department complaining of constant, moderate, left sided HA x 2 days. Pt has a h/o HA and confirms that her current symptoms are consistent with prior HAs. She has taken Excedrin Migraine and Tylenol w/o significant relief. She reports associated, diffuse, neck pain. 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 She denies photophobia, nausea and vomiting.   Pt also complains of a persistent rash beneath her breasts. She was dx with candidiasis intertrigo on 05/09/2016 and d/c with nystatin which she has applied w/o significant relief.   The history is provided by the patient. No language interpreter was used.    Past Medical History:  Diagnosis Date  . Asthma     Patient Active Problem List   Diagnosis Date Noted  . Preterm delivery, delivered 04/13/2013  . 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 Asthma 11/02/2012  . Obesity 11/02/2012  . Family history of breast cancer in mother 11/02/2012  . Current smoker 11/02/2012    Past Surgical History:  Procedure Laterality Date  . None      OB History    Gravida Para Term Preterm AB Living   1 1 0 1 0 0   SAB TAB Ectopic Multiple Live Births   0 0 0 0 1       Home Medications    Prior to Admission medications   Medication Sig Start Date End Date Taking? Authorizing Provider  acetaminophen (TYLENOL) 500 MG tablet Take 1,000 mg by mouth every 6 (six) hours as needed for pain.      Historical Provider, MD  albuterol (PROVENTIL HFA;VENTOLIN HFA) 108 (90 BASE) MCG/ACT inhaler Inhale 2 puffs into the lungs every 6 (six) hours as needed for shortness of breath.     Historical Provider, MD  aspirin-acetaminophen-caffeine (EXCEDRIN MIGRAINE) [phone] MG per tablet Take 1 tablet by mouth every 6 (six) hours as needed for pain.    Historical Provider, MD  nystatin (NYSTATIN) powder Apply topically 4 (four) times daily. 05/09/16   Renne CriglerJoshua Geiple, PA-C    Family History Family History  Problem Relation Age of Onset  . Hypertension Mother   . Cancer Mother   . Hypertension Father   . Hypertension Sister   . Hypertension Maternal Grandmother     Social History Social History  Substance Use Topics  . Smoking status: Current Every Day Smoker    Packs/day: 0.25    Years: 7.00    Types: Cigarettes  . Smokeless tobacco: Never Used  . Alcohol use No     Allergies   Patient has no known allergies.   Review of Systems Review of Systems  Eyes: Negative for photophobia.  Gastrointestinal: Negative for nausea and vomiting.  Musculoskeletal: Positive for neck pain.  Skin: Positive for color change and rash.  Neurological: Positive for headaches.  All other systems reviewed and are negative.   Physical Exam Updated Vital Signs BP 123/80 (BP Location: Right Arm)  Pulse 72   Temp 98.1 F (36.7 C) (Oral)   Resp 20   Ht 5\' 7"  (1.702 m)   SpO2 98%   Physical Exam  Constitutional: She is oriented to person, place, and time. She appears well-developed and well-nourished.  HENT:  Head: Normocephalic and atraumatic.  Eyes: EOM are normal.  Neck: Normal range of motion.  Cardiovascular: Normal rate.   Pulmonary/Chest: Effort normal. No respiratory distress.  Abdominal: Soft.  Musculoskeletal: Normal range of motion.  Neurological: She is alert and oriented to person, place, and time.  Skin: Skin is warm and dry. Rash noted.  Lichenification noted beneath both  breasts w/o excoriations.   Psychiatric: She has a normal mood and affect.  Nursing note and vitals reviewed.  ED Treatments / Results  Labs (all labs ordered are listed, but only abnormal results are displayed) Labs Reviewed - No data to display  EKG  EKG Interpretation None       Radiology No results found.  Procedures Procedures  DIAGNOSTIC STUDIES: Oxygen Saturation is 98% on RA, normal by my interpretation.    COORDINATION OF CARE: 7:35 PM Discussed next steps with pt. Pt verbalized understanding and is agreeable with the plan.    Medications Ordered in ED Medications - No data to display   Initial Impression / Assessment and Plan / ED Course  I have reviewed the triage vital signs and the nursing notes.  Pertinent labs & imaging results that were available during my care of the patient were reviewed by me and considered in my medical decision making (see chart for details).  Clinical Course   Presentation is consistent with tension headache and non concerning for [facility], ICH, Meningitis, or temporal arteritis. Pt is afebrile with no focal neuro deficits, nuchal rigidity, or change in vision. Pt is to follow up with PCP to discuss prophylactic medication. Pt verbalizes understanding and is agreeable with plan to dc.   Patient with rash under breasts that appears to be eczema. No signs of infection. Discharge with symptomatic treatment. Follow up with PCP in 2-3 days.        Final Clinical Impressions(s) / ED Diagnoses   Final diagnoses:  Tension headache  Mild eczema    New Prescriptions Discharge Medication List as of 06/08/2016  8:05 PM    START taking these medications   Details  diclofenac (VOLTAREN) 25 MG EC tablet Take 1 tablet (25 mg total) by mouth 2 (two) times daily., Starting Mon 06/08/2016, Print    hydrocortisone cream 1 % Apply to affected area 2 times daily, Print        I personally performed the services described in this  documentation, which was scribed in my presence. The recorded information has been reviewed and is accurate.    Felicie Mornavid Jamario Colina, NP 06/08/16 [id number]    Loren Raceravid Yelverton, MD 06/08/16 2350

## 2016-06-08 NOTE — ED Notes (Signed)
PT DISCHARGED. INSTRUCTIONS AND PRESCRIPTIONS GIVEN. AAOX4. PT IN NO APPARENT DISTRESS OR PAIN. THE OPPORTUNITY TO ASK QUESTIONS WAS PROVIDED. 

## 2016-06-08 NOTE — ED Triage Notes (Signed)
Pt reports headache and persistent cellulitis in breast area despite recent treatment. Pt denies Hx migraine, sts headache x 2 days. No relief with OC meds,. Alert and oriented x 4. No neuro deficit noted.

## 2016-07-17 ENCOUNTER — Emergency Department (HOSPITAL_COMMUNITY)
Admission: EM | Admit: 2016-07-17 | Discharge: 2016-07-17 | Disposition: A | Discharge status: Home / Self Care | Payer: Medicaid Other | Attending: Emergency Medicine | Admitting: Emergency Medicine

## 2016-07-17 ENCOUNTER — Encounter (HOSPITAL_COMMUNITY): Payer: Self-pay

## 2016-07-17 ENCOUNTER — Emergency Department (HOSPITAL_COMMUNITY): Payer: Medicaid Other

## 2016-07-17 DIAGNOSIS — J45909 Unspecified asthma, uncomplicated: Secondary | ICD-10-CM | POA: Insufficient documentation

## 2016-07-17 DIAGNOSIS — B9689 Other specified bacterial agents as the cause of diseases classified elsewhere: Secondary | ICD-10-CM

## 2016-07-17 DIAGNOSIS — F17210 Nicotine dependence, cigarettes, uncomplicated: Secondary | ICD-10-CM | POA: Insufficient documentation

## 2016-07-17 DIAGNOSIS — L0889 Other specified local infections of the skin and subcutaneous tissue: Secondary | ICD-10-CM

## 2016-07-17 DIAGNOSIS — J069 Acute upper respiratory infection, unspecified: Secondary | ICD-10-CM | POA: Insufficient documentation

## 2016-07-17 DIAGNOSIS — L089 Local infection of the skin and subcutaneous tissue, unspecified: Secondary | ICD-10-CM | POA: Insufficient documentation

## 2016-07-17 DIAGNOSIS — Z7982 Long term (current) use of aspirin: Secondary | ICD-10-CM | POA: Insufficient documentation

## 2016-07-17 MED ORDER — PREDNISONE 20 MG PO TABS: ORAL_TABLET | ORAL | 0 refills | Status: DC

## 2016-07-17 MED ORDER — ALBUTEROL SULFATE HFA 108 (90 BASE) MCG/ACT IN AERS
2.0000 | INHALATION_SPRAY | Freq: Once | RESPIRATORY_TRACT | Status: AC
  Administered 2016-07-17: 2 via RESPIRATORY_TRACT
  Filled 2016-07-17: qty 6.7

## 2016-07-17 MED ORDER — HYDROXYZINE HCL 25 MG PO TABS: 25.0000 mg | ORAL_TABLET | Freq: Four times a day (QID) | ORAL | 0 refills | Status: DC | PRN

## 2016-07-17 MED ORDER — ALBUTEROL SULFATE HFA 108 (90 BASE) MCG/ACT IN AERS: 2.0000 | INHALATION_SPRAY | RESPIRATORY_TRACT | 0 refills | Status: DC | PRN

## 2016-07-17 MED ORDER — MUPIROCIN CALCIUM 2 % EX CREA: 1.0000 "application " | TOPICAL_CREAM | Freq: Two times a day (BID) | CUTANEOUS | 0 refills | Status: AC

## 2016-07-17 MED ORDER — DEXAMETHASONE 4 MG PO TABS
10.0000 mg | ORAL_TABLET | Freq: Once | ORAL | Status: AC
  Administered 2016-07-17: 10 mg via ORAL
  Filled 2016-07-17: qty 2

## 2016-07-17 MED ORDER — CHLORHEXIDINE GLUCONATE 0.12 % MT SOLN: 15.0000 mL | Freq: Two times a day (BID) | OROMUCOSAL | 0 refills | Status: DC

## 2016-07-17 MED ORDER — GUAIFENESIN 100 MG/5ML PO LIQD: 100.0000 mg | ORAL | 0 refills | Status: DC | PRN

## 2016-07-17 MED ORDER — CHLORHEXIDINE GLUCONATE 2 % EX PADS: 2.0000 [IU] | MEDICATED_PAD | Freq: Every day | CUTANEOUS | 0 refills | Status: DC

## 2016-07-17 NOTE — ED Provider Notes (Signed)
[facility] Provider Note   CSN: [id number] Arrival date & time: 07/17/16  2956     History   Chief Complaint Chief Complaint  Patient presents with  . Cough  . Sore Throat    HPI Frances Murphy is a 25 y.o. female.   Cough  This is a recurrent problem. The current episode started 2 days ago. The problem occurs constantly. The cough is non-productive. There has been no fever. Associated symptoms include ear congestion, rhinorrhea, sore throat, shortness of breath and wheezing. She has tried nothing for the symptoms. The treatment provided no relief. She is a smoker. Her past medical history is significant for bronchitis.    Past Medical History:  Diagnosis Date  . Asthma     Patient Active Problem List   Diagnosis Date Noted  . Preterm delivery, delivered 04/13/2013  . 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 Asthma 11/02/2012  . Obesity 11/02/2012  . Family history of breast cancer in mother 11/02/2012  . Current smoker 11/02/2012    Past Surgical History:  Procedure Laterality Date  . None      OB History    Gravida Para Term Preterm AB Living   1 1 0 1 0 0   SAB TAB Ectopic Multiple Live Births   0 0 0 0 1       Home Medications    Prior to Admission medications   Medication Sig Start Date End Date Taking? Authorizing Provider  aspirin-acetaminophen-caffeine (EXCEDRIN MIGRAINE) [phone] MG per tablet Take 1 tablet by mouth every 6 (six) hours as needed for pain.   Yes Historical Provider, MD  diphenhydrAMINE (BENADRYL) 2 % cream Apply 1 application topically 3 (three) times daily as needed for itching.   Yes Historical Provider, MD  hydrocortisone cream 1 % Apply to affected area 2 times daily 06/08/16  Yes Felicie Morn, NP  MedroxyPROGESTERone Acetate (DEPO-PROVERA IM) Inject 1 Syringe into the muscle every 3 (three) months. PT DUE 12/29   Yes Historical Provider, MD  albuterol (PROVENTIL HFA;VENTOLIN HFA) 108 (90 Base) MCG/ACT inhaler  Inhale 2 puffs into the lungs every 4 (four) hours as needed for shortness of breath. 07/17/16   Marily Memos, MD  chlorhexidine (PERIDEX) 0.12 % solution Use as directed 15 mLs in the mouth or throat 2 (two) times daily. 07/17/16   Marily Memos, MD  Chlorhexidine Gluconate 2 % PADS Apply 2 Units topically daily. 07/17/16   Marily Memos, MD  guaiFENesin (ROBITUSSIN) 100 MG/5ML liquid Take 5-10 mLs (100-200 mg total) by mouth every 4 (four) hours as needed for cough. 07/17/16   Marily Memos, MD  hydrOXYzine (ATARAX/VISTARIL) 25 MG tablet Take 1 tablet (25 mg total) by mouth every 6 (six) hours as needed. 07/17/16   Marily Memos, MD  mupirocin cream (BACTROBAN) 2 % Apply 1 application topically 2 (two) times daily. 07/17/16 07/24/16  Marily Memos, MD  predniSONE (DELTASONE) 20 MG tablet 2 tabs po daily x 4 days 07/18/16   Marily Memos, MD    Family History Family History  Problem Relation Age of Onset  . Hypertension Mother   . Cancer Mother   . Hypertension Father   . Hypertension Sister   . Hypertension Maternal Grandmother     Social History Social History  Substance Use Topics  . Smoking status: Current Every Day Smoker    Packs/day: 0.25    Years: 7.00    Types: Cigarettes  . Smokeless tobacco: Never Used  . Alcohol use No  Allergies   Patient has no known allergies.   Review of Systems Review of Systems  HENT: Positive for rhinorrhea and sore throat.   Respiratory: Positive for cough, shortness of breath and wheezing.   Skin: Positive for rash.  All other systems reviewed and are negative.    Physical Exam Updated Vital Signs BP 136/91 (BP Location: Right Arm)   Pulse 93   Temp 98.8 F (37.1 C) (Oral)   Resp 18   SpO2 100%   Physical Exam  Constitutional: She is oriented to person, place, and time. She appears well-developed and well-nourished.  HENT:  Head: Normocephalic and atraumatic.  Eyes: Conjunctivae and EOM are normal.  Neck: Normal range of  motion.  Cardiovascular: Normal rate and regular rhythm.  Exam reveals no friction rub.   No murmur heard. Pulmonary/Chest: No stridor. No respiratory distress. She has wheezes. She has no rales.  Abdominal: She exhibits no distension.  Musculoskeletal: She exhibits no edema or deformity.  Neurological: She is alert and oriented to person, place, and time.  Skin: Skin is warm and dry.  Multiple well-healing old abscesses on lower extremities. No erythema, tenderness, induration or warmth associated with them.  Nursing note and vitals reviewed.    ED Treatments / Results  Labs (all labs ordered are listed, but only abnormal results are displayed) Labs Reviewed - No data to display  EKG  EKG Interpretation None       Radiology Dg Chest 2 View  Result Date: 07/17/2016 CLINICAL DATA:  Cough, chest pain related to cough, sore throat, and headache. 4 days of symptoms. History of asthma and current smoker. EXAM: CHEST  2 VIEW COMPARISON:  PA and lateral chest x-ray of April 24, 2012 FINDINGS: The lungs are adequately inflated and clear. The heart and pulmonary vascularity are normal. The mediastinum is normal in width. There is no pleural effusion. The bony thorax is unremarkable. IMPRESSION: There is no active cardiopulmonary disease. Electronically Signed   By: David  SwazilandJordan M.D.   On: 07/17/2016 09:13    Procedures Procedures (including critical care time)  Medications Ordered in ED Medications  dexamethasone (DECADRON) tablet 10 mg (not administered)  albuterol (PROVENTIL HFA;VENTOLIN HFA) 108 (90 Base) MCG/ACT inhaler 2 puff (not administered)     Initial Impression / Assessment and Plan / ED Course  I have reviewed the triage vital signs and the nursing notes.  Pertinent labs & imaging results that were available during my care of the patient were reviewed by me and considered in my medical decision making (see chart for details).  Clinical Course     Here with  likely acute bronchitis we'll treat appropriate. No indication for antibiotics at this time. 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 Her mother will help her get a primary doctor [facility] with [facility].  Final Clinical Impressions(s) / ED Diagnoses   Final diagnoses:  Upper respiratory tract infection, unspecified type  Chronic bacterial skin infection    New Prescriptions New Prescriptions   CHLORHEXIDINE (PERIDEX) 0.12 % SOLUTION    Use as directed 15 mLs in the mouth or throat 2 (two) times daily.   CHLORHEXIDINE GLUCONATE 2 % PADS    Apply 2 Units topically daily.   GUAIFENESIN (ROBITUSSIN) 100 MG/5ML LIQUID    Take 5-10 mLs (100-200 mg total) by mouth every 4 (four) hours as needed for cough.   HYDROXYZINE (ATARAX/VISTARIL) 25 MG TABLET    Take  1 tablet (25 mg total) by mouth every 6 (six) hours as needed.   MUPIROCIN CREAM (BACTROBAN) 2 %    Apply 1 application topically 2 (two) times daily.   PREDNISONE (DELTASONE) 20 MG TABLET    2 tabs po daily x 4 days     Marily MemosJason Aunesty Tyson, MD 07/17/16 [phone]

## 2016-07-17 NOTE — ED Triage Notes (Signed)
Pt here with cough, chest discomfort with cough, headache, sinus drainage.  No fever.  Symptoms since Monday.  Also, pt has hx of boils.  Having breakout and wants them checked.

## 2016-11-05 ENCOUNTER — Emergency Department (HOSPITAL_COMMUNITY)
Admission: EM | Admit: 2016-11-05 | Discharge: 2016-11-05 | Disposition: A | Discharge status: Home / Self Care | Payer: Medicaid Other | Attending: Emergency Medicine | Admitting: Emergency Medicine

## 2016-11-05 ENCOUNTER — Encounter (HOSPITAL_COMMUNITY): Payer: Self-pay

## 2016-11-05 DIAGNOSIS — K047 Periapical abscess without sinus: Secondary | ICD-10-CM

## 2016-11-05 DIAGNOSIS — Z79899 Other long term (current) drug therapy: Secondary | ICD-10-CM | POA: Insufficient documentation

## 2016-11-05 DIAGNOSIS — Z7982 Long term (current) use of aspirin: Secondary | ICD-10-CM | POA: Insufficient documentation

## 2016-11-05 DIAGNOSIS — J45909 Unspecified asthma, uncomplicated: Secondary | ICD-10-CM | POA: Insufficient documentation

## 2016-11-05 DIAGNOSIS — Z720 Tobacco use: Secondary | ICD-10-CM

## 2016-11-05 DIAGNOSIS — F17210 Nicotine dependence, cigarettes, uncomplicated: Secondary | ICD-10-CM | POA: Insufficient documentation

## 2016-11-05 DIAGNOSIS — K029 Dental caries, unspecified: Secondary | ICD-10-CM

## 2016-11-05 MED ORDER — PENICILLIN V POTASSIUM 500 MG PO TABS: 1000.0000 mg | ORAL_TABLET | Freq: Two times a day (BID) | ORAL | 0 refills | Status: DC

## 2016-11-05 NOTE — ED Triage Notes (Signed)
Pt reports right and left sided dental pain that has been going on since October but has not been able to see a dentist.

## 2016-11-05 NOTE — ED Provider Notes (Signed)
[facility]-EMERGENCY DEPT Provider Note   CSN: [id number] Arrival date & time: 11/05/16  1429   By signing my name below, I, Clarisse Gouge, attest that this documentation has been prepared under the direction and in the presence of [address], [employer]. Electronically Signed: Clarisse Gouge, Scribe. 11/05/16. 3:09 PM.  History   Chief Complaint Chief Complaint  Patient presents with  . Dental Pain   The history is provided by the patient and medical records. No language interpreter was used.  Dental Pain   This is a recurrent problem. The current episode started more than 1 week ago. The problem occurs daily. The problem has not changed since onset.The pain is at a severity of 10/10. The pain is moderate. She has tried acetaminophen (and aleve) for the symptoms. The treatment provided mild relief.    HPI Comments: Frances Murphy is a 26 y.o. female who presents to the Emergency Department complaining of unchanged constant L upper and R lower dental pain onset 05/2016. She has no insurance so she hasn't been able to follow up with a dentist; has "holes" in both teeth, which is why she continues to have pain. She describes the pain as 10/10, intermittent, throbbing/pressure like, non radiating L upper and R lower dental pain, worse when food gets into the "holes" in her teeth, and improved temporarily with aleve and tylenol 3 applied directly to the teeth. Pt denies gum swelling or drainage, trismus, drooling, sore throat, ear pain or drainage, rhinorrhea, fevers, chills, CP, SOB, abd pain, N/V/D/C, dysuria, hematuria, numbness, tingling, focal weakness, or any other complaints at this time. +Smoker. Does not have a dentist. NKDA   Past Medical History:  Diagnosis Date  . Asthma     Patient Active Problem List   Diagnosis Date Noted  . Preterm delivery, delivered 04/13/2013  . 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 Asthma 11/02/2012  . Obesity 11/02/2012  . Family history of breast  cancer in mother 11/02/2012  . Current smoker 11/02/2012    Past Surgical History:  Procedure Laterality Date  . None      OB History    Gravida Para Term Preterm AB Living   1 1 0 1 0 0   SAB TAB Ectopic Multiple Live Births   0 0 0 0 1       Home Medications    Prior to Admission medications   Medication Sig Start Date End Date Taking? Authorizing Provider  albuterol (PROVENTIL HFA;VENTOLIN HFA) 108 (90 Base) MCG/ACT inhaler Inhale 2 puffs into the lungs every 4 (four) hours as needed for shortness of breath. 07/17/16   Marily Memos, MD  aspirin-acetaminophen-caffeine (EXCEDRIN MIGRAINE) [phone] MG per tablet Take 1 tablet by mouth every 6 (six) hours as needed for pain.    Historical Provider, MD  chlorhexidine (PERIDEX) 0.12 % solution Use as directed 15 mLs in the mouth or throat 2 (two) times daily. 07/17/16   Marily Memos, MD  Chlorhexidine Gluconate 2 % PADS Apply 2 Units topically daily. 07/17/16   Marily Memos, MD  diphenhydrAMINE (BENADRYL) 2 % cream Apply 1 application topically 3 (three) times daily as needed for itching.    Historical Provider, MD  guaiFENesin (ROBITUSSIN) 100 MG/5ML liquid Take 5-10 mLs (100-200 mg total) by mouth every 4 (four) hours as needed for cough. 07/17/16   Marily Memos, MD  hydrocortisone cream 1 % Apply to affected area 2 times daily 06/08/16   Felicie Morn, NP  hydrOXYzine (ATARAX/VISTARIL) 25 MG tablet Take 1  tablet (25 mg total) by mouth every 6 (six) hours as needed. 07/17/16   Marily Memos, MD  MedroxyPROGESTERone Acetate (DEPO-PROVERA IM) Inject 1 Syringe into the muscle every 3 (three) months. PT DUE 12/29    Historical Provider, MD  penicillin v potassium (VEETID) 500 MG tablet Take 2 tablets (1,000 mg total) by mouth 2 (two) times daily. X 7 days 11/05/16   [address], PA-C  predniSONE (DELTASONE) 20 MG tablet 2 tabs po daily x 4 days 07/18/16   Marily Memos, MD    Family History Family History  Problem Relation Age of Onset   . Hypertension Mother   . Cancer Mother   . Hypertension Father   . Hypertension Sister   . Hypertension Maternal Grandmother     Social History Social History  Substance Use Topics  . Smoking status: Current Every Day Smoker    Packs/day: 0.25    Years: 7.00    Types: Cigarettes  . Smokeless tobacco: Never Used  . Alcohol use No     Allergies   Patient has no known allergies.   Review of Systems Review of Systems  Constitutional: Negative for chills and fever.  HENT: Positive for dental problem. Negative for drooling, ear discharge, ear pain, rhinorrhea, sore throat and trouble swallowing.   Respiratory: Negative for shortness of breath.   Cardiovascular: Negative for chest pain.  Gastrointestinal: Negative for abdominal pain, constipation, diarrhea, nausea and vomiting.  Genitourinary: Negative for dysuria and hematuria.  Musculoskeletal: Negative for arthralgias and myalgias.  Skin: Negative for wound.  Allergic/Immunologic: Negative for immunocompromised state.  Neurological: Negative for weakness and numbness.  Psychiatric/Behavioral: Negative for confusion.   10 Systems reviewed and all are negative for acute change except as noted in the HPI.   Physical Exam Updated Vital Signs BP (!) 130/101 (BP Location: Left Arm)   Pulse 74   Temp 98.4 F (36.9 C) (Oral)   Resp 16   Ht  (1.702 m)   SpO2 97%   Physical Exam  Constitutional: She is oriented to person, place, and time. Vital signs are normal. She appears well-developed and well-nourished.  Non-toxic appearance. No distress.  Afebrile, nontoxic, NAD  HENT:  Head: Normocephalic and atraumatic.  Nose: Nose normal.  Mouth/Throat: Uvula is midline, oropharynx is clear and moist and mucous membranes are normal. No trismus in the jaw. Dental caries present. No dental abscesses or uvula swelling. Tonsils are 0 on the right. Tonsils are 0 on the left. No tonsillar exudate.  L upper molars #15-16 and R  lower molar #32 decayed, without surrounding gingival erythema or swelling, no evidence of ludwig's or abscess, no PTA. Nose clear. Oropharynx clear and moist, without uvular swelling or deviation, no trismus or drooling, no tonsillar swelling or erythema, no exudates.    Eyes: Conjunctivae and EOM are normal. Right eye exhibits no discharge. Left eye exhibits no discharge.  Neck: Normal range of motion. Neck supple.  Cardiovascular: Normal rate and intact distal pulses.   Pulmonary/Chest: Effort normal. No respiratory distress.  Abdominal: Normal appearance. She exhibits no distension.  Musculoskeletal: Normal range of motion.  Neurological: She is alert and oriented to person, place, and time. She has normal strength. No sensory deficit.  Skin: Skin is warm, dry and intact. No rash noted.  Psychiatric: She has a normal mood and affect. Her behavior is normal.  Nursing note and vitals reviewed.    ED Treatments / Results  DIAGNOSTIC STUDIES: Oxygen Saturation is 97% on RA,  normal by my interpretation.    COORDINATION OF CARE: 2:56 PM Discussed treatment plan with pt at bedside and pt agreed to plan. Will Rx medications and refer to dentist.  Labs (all labs ordered are listed, but only abnormal results are displayed) Labs Reviewed - No data to display  EKG  EKG Interpretation None       Radiology No results found.  Procedures Procedures (including critical care time)  Medications Ordered in ED Medications - No data to display   Initial Impression / Assessment and Plan / ED Course  I have reviewed the triage vital signs and the nursing notes.  Pertinent labs & imaging results that were available during my care of the patient were reviewed by me and considered in my medical decision making (see chart for details).     26 y.o. female here with Dental pain x6 months, associated with dental decay and possible dental infection but no discrete abscess, with patient afebrile,  non toxic appearing and swallowing secretions well, no evidence of ludwig's. I gave patient referral to dentist and stressed the importance of dental follow up for ultimate management of dental pain.  I have also discussed reasons to return immediately to the ER.  Patient expresses understanding and agrees with plan.  I will also give PCN VK to cover for infection, and discussed OTC remedies for pain control. Smoking cessation advised.   I personally performed the services described in this documentation, which was scribed in my presence. The recorded information has been reviewed and is accurate.   Final Clinical Impressions(s) / ED Diagnoses   Final diagnoses:  Pain due to dental caries  Infected dental caries  Tobacco user    New Prescriptions New Prescriptions   PENICILLIN V POTASSIUM (VEETID) 500 MG TABLET    Take 2 tablets (1,000 mg total) by mouth 2 (two) times daily. X 7 days     [address] PA-C 11/05/16 1515    Maia Plan, MD 11/06/16 1308

## 2016-11-05 NOTE — Discharge Instructions (Signed)
Apply warm compresses to jaw throughout the day. Take antibiotic until finished. Alternate between tylenol and motrin as needed for pain. Perform salt water swishes to help with pain/swelling. Use over the counter oragel as needed for additional relief. STOP SMOKING! Followup with a dentist is very important for ongoing evaluation and management of recurrent dental pain, call the dentist listed above in the next 24-48 hours to schedule ongoing dental care, or use the list below to find a dentist in the next 24-48 hours for ongoing management of your dental issue. Return to emergency department for emergent changing or worsening symptoms.  °

## 2016-11-05 NOTE — ED Notes (Signed)
Patient was alert, oriented and stable upon discharge. RN went over AVS and patient had no further questions.  

## 2016-12-17 ENCOUNTER — Encounter (HOSPITAL_COMMUNITY): Payer: Self-pay | Admitting: Emergency Medicine

## 2016-12-17 ENCOUNTER — Emergency Department (HOSPITAL_COMMUNITY)
Admission: EM | Admit: 2016-12-17 | Discharge: 2016-12-17 | Disposition: A | Discharge status: Home / Self Care | Payer: Medicaid Other | Attending: Emergency Medicine | Admitting: Emergency Medicine

## 2016-12-17 DIAGNOSIS — Z7982 Long term (current) use of aspirin: Secondary | ICD-10-CM | POA: Insufficient documentation

## 2016-12-17 DIAGNOSIS — J020 Streptococcal pharyngitis: Secondary | ICD-10-CM

## 2016-12-17 DIAGNOSIS — Z79899 Other long term (current) drug therapy: Secondary | ICD-10-CM | POA: Insufficient documentation

## 2016-12-17 DIAGNOSIS — R21 Rash and other nonspecific skin eruption: Secondary | ICD-10-CM

## 2016-12-17 DIAGNOSIS — Z720 Tobacco use: Secondary | ICD-10-CM

## 2016-12-17 DIAGNOSIS — F17210 Nicotine dependence, cigarettes, uncomplicated: Secondary | ICD-10-CM | POA: Insufficient documentation

## 2016-12-17 DIAGNOSIS — J45909 Unspecified asthma, uncomplicated: Secondary | ICD-10-CM | POA: Insufficient documentation

## 2016-12-17 DIAGNOSIS — B372 Candidiasis of skin and nail: Secondary | ICD-10-CM

## 2016-12-17 LAB — RAPID STREP SCREEN (MED CTR MEBANE ONLY): STREPTOCOCCUS, GROUP A SCREEN (DIRECT): POSITIVE — AB

## 2016-12-17 MED ORDER — CLOTRIMAZOLE-BETAMETHASONE 1-0.05 % EX CREA: TOPICAL_CREAM | CUTANEOUS | 0 refills | Status: DC

## 2016-12-17 MED ORDER — PENICILLIN G BENZATHINE 1200000 UNIT/2ML IM SUSP
1.2000 10*6.[IU] | Freq: Once | INTRAMUSCULAR | Status: AC
  Administered 2016-12-17: 1.2 10*6.[IU] via INTRAMUSCULAR
  Filled 2016-12-17: qty 2

## 2016-12-17 NOTE — ED Triage Notes (Signed)
Patient in from home with sore throat for 3 days. Unable to eat anything due to pain. Also complains of rash in between breasts-dry itchy

## 2016-12-17 NOTE — Discharge Instructions (Signed)
Continue to stay well-hydrated. Gargle warm salt water and spit it out. Use chloraseptic spray as needed for sore throat. Continue to alternate between Tylenol and Ibuprofen for pain or fever. Use Mucinex for cough suppression/expectoration of mucus. Use netipot and flonase to help with nasal congestion. May consider over-the-counter Benadryl or other antihistamine to decrease secretions and for help with your symptoms. STOP SMOKING! For your rash, keep the area of skin between and under your breast very dry, using baby powder to help with this. Use cream as directed but don't use it for a prolonged period of time as this may cause issues with prolonged steroid use. Follow up with the [facility] in 5-7 days for recheck of ongoing symptoms and to establish medical care. Return to emergency department for emergent changing or worsening of symptoms.

## 2016-12-17 NOTE — ED Provider Notes (Signed)
[facility] DEPT Provider Note    By signing my name below, I, Frances Murphy, attest that this documentation has been prepared under the direction and in the presence of [address] [employer]. Electronically Signed: Earmon Murphy, ED Scribe. 12/17/16. 12:43 PM.   History   Chief Complaint Chief Complaint  Patient presents with  . Sore Throat  . Rash    The history is provided by the patient and medical records. No language interpreter was used.  Sore Throat  This is a new problem. The current episode started more than 2 days ago. The problem occurs constantly. The problem has been gradually worsening. Pertinent negatives include no chest pain, no abdominal pain and no shortness of breath. The symptoms are aggravated by swallowing. Nothing relieves the symptoms. Treatments tried: salt water gargles and peridex. The treatment provided mild relief.    Frances Murphy is an obese 26 y.o. female with PMHx of asthma, who presents to the Emergency Department complaining of sore throat that began two days ago. She has been using warm salt water gargles and Peridex for symptoms with minimal relief. Swallowing increases her pain. She denies alleviating factors.  She also reports an itchy rash between her breasts and her abdomen. She has been using a cream she got from her sister that helps to alleviate the symptoms (lotrisone) and is requesting this to be refilled. Heat and moisture increases the symptoms. She denies warmth, redness or drainage from the area. She also denies fever, chills, congestion, drooling, trismus, otalgia, rhinorrhea, difficulty breathing or swallowing, cough, CP, SOB, abdominal pain, constipation, diarrhea, nausea, vomiting, dysuria, hematuria, body aches, numbness, tingling, focal weakness, or any other complaints at this time. She is currently a smoker.She denies allergies to any medications.    Past Medical History:  Diagnosis Date  . Asthma     Patient Active  Problem List   Diagnosis Date Noted  . Preterm delivery, delivered 04/13/2013  . 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 Asthma 11/02/2012  . Obesity 11/02/2012  . Family history of breast cancer in mother 11/02/2012  . Current smoker 11/02/2012    Past Surgical History:  Procedure Laterality Date  . None      OB History    Gravida Para Term Preterm AB Living   1 1 0 1 0 0   SAB TAB Ectopic Multiple Live Births   0 0 0 0 1       Home Medications    Prior to Admission medications   Medication Sig Start Date End Date Taking? Authorizing Provider  albuterol (PROVENTIL HFA;VENTOLIN HFA) 108 (90 Base) MCG/ACT inhaler Inhale 2 puffs into the lungs every 4 (four) hours as needed for shortness of breath. 07/17/16   Mesner, Barbara Cower, MD  aspirin-acetaminophen-caffeine (EXCEDRIN MIGRAINE) [phone] MG per tablet Take 1 tablet by mouth every 6 (six) hours as needed for pain.    [provider]  chlorhexidine (PERIDEX) 0.12 % solution Use as directed 15 mLs in the mouth or throat 2 (two) times daily. 07/17/16   Mesner, Barbara Cower, MD  Chlorhexidine Gluconate 2 % PADS Apply 2 Units topically daily. 07/17/16   Mesner, Barbara Cower, MD  diphenhydrAMINE (BENADRYL) 2 % cream Apply 1 application topically 3 (three) times daily as needed for itching.    [provider]  guaiFENesin (ROBITUSSIN) 100 MG/5ML liquid Take 5-10 mLs (100-200 mg total) by mouth every 4 (four) hours as needed for cough. 07/17/16   Mesner, Barbara Cower, MD  hydrocortisone cream 1 % Apply to affected  area 2 times daily 06/08/16   Felicie Morn, NP  hydrOXYzine (ATARAX/VISTARIL) 25 MG tablet Take 1 tablet (25 mg total) by mouth every 6 (six) hours as needed. 07/17/16   Mesner, Barbara Cower, MD  MedroxyPROGESTERone Acetate (DEPO-PROVERA IM) Inject 1 Syringe into the muscle every 3 (three) months. PT DUE 12/29    [provider]  penicillin v potassium (VEETID) 500 MG tablet Take 2 tablets (1,000 mg total) by mouth 2 (two)  times daily. X 7 days 11/05/16   Makaela Cando, [place], PA-C  predniSONE (DELTASONE) 20 MG tablet 2 tabs po daily x 4 days 07/18/16   Mesner, Barbara Cower, MD    Family History Family History  Problem Relation Age of Onset  . Hypertension Mother   . Cancer Mother   . Hypertension Father   . Hypertension Sister   . Hypertension Maternal Grandmother     Social History Social History  Substance Use Topics  . Smoking status: Current Every Day Smoker    Packs/day: 0.25    Years: 7.00    Types: Cigarettes  . Smokeless tobacco: Never Used  . Alcohol use No     Allergies   Patient has no known allergies.   Review of Systems Review of Systems  Constitutional: Negative for chills and fever.  HENT: Positive for sore throat. Negative for congestion, drooling, ear pain, rhinorrhea and trouble swallowing.   Respiratory: Negative for cough and shortness of breath.   Cardiovascular: Negative for chest pain.  Gastrointestinal: Negative for abdominal pain, constipation, diarrhea, nausea and vomiting.  Genitourinary: Negative for dysuria and hematuria.  Musculoskeletal: Negative for arthralgias and myalgias.  Skin: Positive for rash. Negative for color change.  Allergic/Immunologic: Negative for immunocompromised state.  Neurological: Negative for weakness and numbness.  Psychiatric/Behavioral: Negative for confusion.   All other systems reviewed and are negative for acute change except as noted in the HPI.   Physical Exam Updated Vital Signs BP (!) 137/95 (BP Location: Left Arm)   Pulse 82   Temp 98.5 F (36.9 C) (Oral)   Resp 18   Ht 5\' 7"  (1.702 m)   Wt 252 lb 3.2 oz (114.4 kg)   SpO2 100%   BMI 39.50 kg/m   Physical Exam  Constitutional: She is oriented to person, place, and time. Vital signs are normal. She appears well-developed and well-nourished.  Non-toxic appearance. No distress.  Afebrile, nontoxic, NAD  HENT:  Head: Normocephalic and atraumatic.  Nose: Nose normal.    Mouth/Throat: Uvula is midline and mucous membranes are normal. No trismus in the jaw. No uvula swelling. Oropharyngeal exudate, posterior oropharyngeal edema and posterior oropharyngeal erythema present. No tonsillar abscesses. Tonsils are 3+ on the right. Tonsils are 3+ on the left. Tonsillar exudate.  Nose clear. Oropharynx injected, without uvular swelling or deviation, no trismus or drooling, with 3+ bilateral tonsillar swelling and erythema, +exudates. No PTA.  Eyes: Conjunctivae and EOM are normal. Right eye exhibits no discharge. Left eye exhibits no discharge.  Neck: Normal range of motion. Neck supple.  Cardiovascular: Normal rate and intact distal pulses.   Pulmonary/Chest: Effort normal. No respiratory distress.  Abdominal: Normal appearance. She exhibits no distension.  Musculoskeletal: Normal range of motion.  Lymphadenopathy:       Head (right side): Tonsillar adenopathy present.       Head (left side): Tonsillar adenopathy present.  Bilateral tonsillar LAD which is mildly TTP.  Neurological: She is alert and oriented to person, place, and time. She has normal strength. No sensory deficit.  Skin: Skin is warm, dry and intact. No rash noted.  Chaperone present for exam Hyperpigmented and somewhat scaly rash to intertrigonous spaces between breasts, no erythema or warmth, no open wounds.  Psychiatric: She has a normal mood and affect. Her behavior is normal.  Nursing note and vitals reviewed.    ED Treatments / Results  DIAGNOSTIC STUDIES: Oxygen Saturation is 100% on RA, normal by my interpretation.   COORDINATION OF CARE: 12:36 PM- Will treat for strep throat. Offered injection of Bicillin LA or prescription of oral PCN. Pt opted for injection. Will prescribe cream for rash. Will give information to establish care with [facility]. Pt verbalizes understanding and agrees to plan.  Medications  penicillin g benzathine (BICILLIN LA) 1200000 UNIT/2ML injection  1.2 Million Units (not administered)    Labs (all labs ordered are listed, but only abnormal results are displayed) Labs Reviewed  RAPID STREP SCREEN (NOT AT [facility]) - Abnormal; Notable for the following:       Result Value   Streptococcus, Group A Screen (Direct) POSITIVE (*)    All other components within normal limits    EKG  EKG Interpretation None       Radiology No results found.  Procedures Procedures (including critical care time)  Medications Ordered in ED Medications  penicillin g benzathine (BICILLIN LA) 1200000 UNIT/2ML injection 1.2 Million Units (1.2 Million Units Intramuscular Given 12/17/16 1254)     Initial Impression / Assessment and Plan / ED Course  I have reviewed the triage vital signs and the nursing notes.  Pertinent labs & imaging results that were available during my care of the patient were reviewed by me and considered in my medical decision making (see chart for details).     26 y.o. female here with sore throat x2 days, also requesting rx for lotrisone for a rash she persistently gets between and under her breasts. On exam, tonsils 3+ bilaterally with +exudates, handling secretions well, no evidence of PTA, +b/l tonsillar LAD; CENTOR score fairly high, but no fever present. RST obtained in triage and is positive for strep, pt opted for bicillin now as tx. As for her rash, intertrigonous region of breasts has hyperpigmented and somewhat scaly, looks somewhat like eczema vs yeast dermatitis. Will rx the cream she's requesting, as this will help with both, but advised keeping area dry, and not using the cream for too long since prolonged steroids can have negative effects; if this persists, may need to consider f/up to evaluate for eczema and other chronic tx for this that isn't daily steroid. Advised keeping area dry with baby powder. F/up with [facility] in 1wk for recheck of symptoms and to establish care. OTC symptomatic relief advised as well. Smoking  cessation advised. I explained the diagnosis and have given explicit precautions to return to the ER including for any other new or worsening symptoms. The patient understands and accepts the medical plan as it's been dictated and I have answered their questions. Discharge instructions concerning home care and prescriptions have been given. The patient is STABLE and is discharged to home in good condition.   I personally performed the services described in this documentation, which was scribed in my presence. The recorded information has been reviewed and is accurate.    Final Clinical Impressions(s) / ED Diagnoses   Final diagnoses:  Strep pharyngitis  Rash  Yeast dermatitis  Tobacco user    New Prescriptions New Prescriptions   CLOTRIMAZOLE-BETAMETHASONE (LOTRISONE) CREAM    Apply  to affected area 2 times daily prn     [address] [place], [place] 12/17/16 1300    Charlynne PanderYao, David Hsienta, MD 12/19/16 [phone]

## 2017-04-06 ENCOUNTER — Encounter (HOSPITAL_COMMUNITY): Payer: Self-pay

## 2017-04-06 ENCOUNTER — Emergency Department (HOSPITAL_COMMUNITY)
Admission: EM | Admit: 2017-04-06 | Discharge: 2017-04-06 | Disposition: A | Discharge status: Home / Self Care | Payer: Self-pay | Attending: Emergency Medicine | Admitting: Emergency Medicine

## 2017-04-06 DIAGNOSIS — R51 Headache: Secondary | ICD-10-CM | POA: Insufficient documentation

## 2017-04-06 DIAGNOSIS — Z5321 Procedure and treatment not carried out due to patient leaving prior to being seen by health care provider: Secondary | ICD-10-CM | POA: Insufficient documentation

## 2017-04-06 NOTE — ED Notes (Signed)
Called patient to room and no answer. 

## 2017-04-06 NOTE — ED Triage Notes (Signed)
Patient c/o headache x 1 week. Patient states sensitivity to light. Patient c/o intermittent nausea.

## 2017-04-08 ENCOUNTER — Emergency Department (HOSPITAL_COMMUNITY)
Admission: EM | Admit: 2017-04-08 | Discharge: 2017-04-08 | Disposition: A | Discharge status: Home / Self Care | Payer: Self-pay | Attending: Emergency Medicine | Admitting: Emergency Medicine

## 2017-04-08 ENCOUNTER — Encounter (HOSPITAL_COMMUNITY): Payer: Self-pay | Admitting: Emergency Medicine

## 2017-04-08 DIAGNOSIS — F17210 Nicotine dependence, cigarettes, uncomplicated: Secondary | ICD-10-CM | POA: Insufficient documentation

## 2017-04-08 DIAGNOSIS — G43009 Migraine without aura, not intractable, without status migrainosus: Secondary | ICD-10-CM | POA: Insufficient documentation

## 2017-04-08 DIAGNOSIS — J45909 Unspecified asthma, uncomplicated: Secondary | ICD-10-CM | POA: Insufficient documentation

## 2017-04-08 MED ORDER — KETOROLAC TROMETHAMINE 30 MG/ML IJ SOLN
30.0000 mg | Freq: Once | INTRAMUSCULAR | Status: AC
  Administered 2017-04-08: 30 mg via INTRAVENOUS
  Filled 2017-04-08: qty 1

## 2017-04-08 MED ORDER — DIPHENHYDRAMINE HCL 50 MG/ML IJ SOLN
12.5000 mg | Freq: Once | INTRAMUSCULAR | Status: AC
  Administered 2017-04-08: 12.5 mg via INTRAVENOUS
  Filled 2017-04-08: qty 1

## 2017-04-08 MED ORDER — SODIUM CHLORIDE 0.9 % IV BOLUS (SEPSIS)
1000.0000 mL | Freq: Once | INTRAVENOUS | Status: AC
  Administered 2017-04-08: 1000 mL via INTRAVENOUS

## 2017-04-08 MED ORDER — PROCHLORPERAZINE EDISYLATE 5 MG/ML IJ SOLN
10.0000 mg | Freq: Once | INTRAMUSCULAR | Status: AC
  Administered 2017-04-08: 10 mg via INTRAVENOUS
  Filled 2017-04-08: qty 2

## 2017-04-08 MED ORDER — BUTALBITAL-ASPIRIN-CAFFEINE 50-325-40 MG PO CAPS
1.0000 | ORAL_CAPSULE | Freq: Four times a day (QID) | ORAL | 0 refills | Status: DC | PRN
Start: 2017-04-08 — End: 2017-06-18

## 2017-04-08 NOTE — ED Provider Notes (Signed)
[facility]-EMERGENCY DEPT Provider Note   CSN: [id number] Arrival date & time: 04/08/17  1526     History   Chief Complaint Chief Complaint  Patient presents with  . Headache    HPI Frances Murphy is a 26 y.o. female.  HPI Pt has a pressure headache in the front of her head.  It feels a little better when she rubs her forehead.  The light bother her eyes but not noise.  No fevers.   No vomiting.  Some nausea.  No falls or injuries.  No numbness or weakness.  She has had headaches previously and has been seen in EDs several times in the past.  No formal diagnosis of migraines and has not seen a pcp or neurologist for her headaches. Past Medical History:  Diagnosis Date  . Asthma     Patient Active Problem List   Diagnosis Date Noted  . Preterm delivery, delivered 04/13/2013  . 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 Asthma 11/02/2012  . Obesity 11/02/2012  . Family history of breast cancer in mother 11/02/2012  . Current smoker 11/02/2012    Past Surgical History:  Procedure Laterality Date  . None      OB History    Gravida Para Term Preterm AB Living   1 1 0 1 0 0   SAB TAB Ectopic Multiple Live Births   0 0 0 0 1       Home Medications    Prior to Admission medications   Medication Sig Start Date End Date Taking? Authorizing Provider  acetaminophen (TYLENOL) 500 MG tablet Take 1,000 mg by mouth every 6 (six) hours as needed for mild pain, moderate pain or headache.   Yes [provider]  albuterol (PROVENTIL HFA;VENTOLIN HFA) 108 (90 Base) MCG/ACT inhaler Inhale 2 puffs into the lungs every 4 (four) hours as needed for shortness of breath. 07/17/16  Yes Mesner, Barbara CowerJason, MD  aspirin-acetaminophen-caffeine (EXCEDRIN MIGRAINE) [phone] MG per tablet Take 1 tablet by mouth every 6 (six) hours as needed for pain.   Yes [provider]  clotrimazole-betamethasone (LOTRISONE) cream Apply to affected area 2 times daily prn Patient taking differently:  Apply 1 application topically 2 (two) times daily as needed (Rash). Apply to affected area 2 times daily prn 12/17/16  Yes Street, [place], PA-C  butalbital-aspirin-caffeine [facility](FIORINAL) 50-325-40 MG capsule Take 1 capsule by mouth every 6 (six) hours as needed for headache. 04/08/17   Linwood DibblesKnapp, Afsheen Antony, MD  guaiFENesin (ROBITUSSIN) 100 MG/5ML liquid Take 5-10 mLs (100-200 mg total) by mouth every 4 (four) hours as needed for cough. Patient not taking: Reported on 04/08/2017 07/17/16   Mesner, Barbara CowerJason, MD  hydrocortisone cream 1 % Apply to affected area 2 times daily Patient not taking: Reported on 04/08/2017 06/08/16   Felicie MornSmith, David, NP  hydrOXYzine (ATARAX/VISTARIL) 25 MG tablet Take 1 tablet (25 mg total) by mouth every 6 (six) hours as needed. Patient not taking: Reported on 04/08/2017 07/17/16   Mesner, Barbara CowerJason, MD  penicillin v potassium (VEETID) 500 MG tablet Take 2 tablets (1,000 mg total) by mouth 2 (two) times daily. X 7 days Patient not taking: Reported on 04/08/2017 11/05/16   Street, NixonMercedes, PA-C  predniSONE (DELTASONE) 20 MG tablet 2 tabs po daily x 4 days Patient not taking: Reported on 04/08/2017 07/18/16   Mesner, Barbara CowerJason, MD    Family History Family History  Problem Relation Age of Onset  . Hypertension Mother   . Cancer Mother   . Hypertension Father   .  Hypertension Sister   . Hypertension Maternal Grandmother     Social History Social History  Substance Use Topics  . Smoking status: Current Every Day Smoker    Packs/day: 0.25    Years: 7.00    Types: Cigarettes  . Smokeless tobacco: Never Used  . Alcohol use No     Allergies   Patient has no known allergies.   Review of Systems Review of Systems  All other systems reviewed and are negative.    Physical Exam Updated Vital Signs BP 116/63 (BP Location: Right Arm)   Pulse 65   Temp 98.3 F (36.8 C) (Oral)   Resp 16   LMP 03/15/2017   SpO2 100%   Physical Exam  Constitutional: She appears well-developed and  well-nourished. No distress.  HENT:  Head: Normocephalic and atraumatic.  Right Ear: External ear normal.  Left Ear: External ear normal.  Eyes: Conjunctivae are normal. Right eye exhibits no discharge. Left eye exhibits no discharge. No scleral icterus.  Neck: Neck supple. No tracheal deviation present.  Cardiovascular: Normal rate, regular rhythm and intact distal pulses.   Pulmonary/Chest: Effort normal and breath sounds normal. No stridor. No respiratory distress. She has no wheezes. She has no rales.  Abdominal: Soft. Bowel sounds are normal. She exhibits no distension. There is no tenderness. There is no rebound and no guarding.  Musculoskeletal: She exhibits no edema or tenderness.  Neurological: She is alert. She has normal strength. No cranial nerve deficit (no facial droop, extraocular movements intact, no slurred speech) or sensory deficit. She exhibits normal muscle tone. She displays no seizure activity. Coordination normal.  Skin: Skin is warm and dry. No rash noted.  Psychiatric: She has a normal mood and affect.  Nursing note and vitals reviewed.    ED Treatments / Results    Radiology No results found.  Procedures Procedures (including critical care time)  Medications Ordered in ED Medications  prochlorperazine (COMPAZINE) injection 10 mg (10 mg Intravenous Given 04/08/17 2137)  diphenhydrAMINE (BENADRYL) injection 12.5 mg (12.5 mg Intravenous Given 04/08/17 2136)  ketorolac (TORADOL) 30 MG/ML injection 30 mg (30 mg Intravenous Given 04/08/17 2136)  sodium chloride 0.9 % bolus 1,000 mL (0 mLs Intravenous Stopped 04/08/17 2326)     Initial Impression / Assessment and Plan / ED Course  I have reviewed the triage vital signs and the nursing notes.  Pertinent labs & imaging results that were available during my care of the patient were reviewed by me and considered in my medical decision making (see chart for details).  Clinical Course as of Apr 08 2326  Thu Apr 08, 2017  2324 Pt is feeling better.  Ready to go home.  [JK]    Clinical Course User Index [JK] Linwood Dibbles, MD  Pt presented with a recurrent headache.  Doubt hemorrhage, meningitis, tumor or other serious etiology.  Suspect migraine headache.  Improved with treatment.  Discussed outpatient follow up  Final Clinical Impressions(s) / ED Diagnoses   Final diagnoses:  Migraine without aura and without status migrainosus, not intractable    New Prescriptions New Prescriptions   BUTALBITAL-ASPIRIN-CAFFEINE (FIORINAL) 50-325-40 MG CAPSULE    Take 1 capsule by mouth every 6 (six) hours as needed for headache.     Linwood Dibbles, MD 04/08/17 [id number]

## 2017-04-08 NOTE — ED Triage Notes (Signed)
Pt reports generalized HA for the past 2 weeks that gets worse with bright lights sometimes.

## 2017-04-09 MED ORDER — DOXYCYCLINE HYCLATE 100 MG PO TABS
ORAL_TABLET | ORAL | Status: AC
  Filled 2017-04-09: qty 1

## 2017-06-11 ENCOUNTER — Other Ambulatory Visit: Payer: Self-pay

## 2017-06-11 ENCOUNTER — Emergency Department (HOSPITAL_COMMUNITY)
Admission: EM | Admit: 2017-06-11 | Discharge: 2017-06-11 | Disposition: A | Discharge status: Home / Self Care | Payer: Self-pay | Attending: Emergency Medicine | Admitting: Emergency Medicine

## 2017-06-11 ENCOUNTER — Encounter (HOSPITAL_COMMUNITY): Payer: Self-pay | Admitting: Emergency Medicine

## 2017-06-11 DIAGNOSIS — Z79899 Other long term (current) drug therapy: Secondary | ICD-10-CM | POA: Insufficient documentation

## 2017-06-11 DIAGNOSIS — F17210 Nicotine dependence, cigarettes, uncomplicated: Secondary | ICD-10-CM | POA: Insufficient documentation

## 2017-06-11 DIAGNOSIS — J45909 Unspecified asthma, uncomplicated: Secondary | ICD-10-CM | POA: Insufficient documentation

## 2017-06-11 DIAGNOSIS — Z7982 Long term (current) use of aspirin: Secondary | ICD-10-CM | POA: Insufficient documentation

## 2017-06-11 DIAGNOSIS — R51 Headache: Secondary | ICD-10-CM | POA: Insufficient documentation

## 2017-06-11 DIAGNOSIS — R519 Headache, unspecified: Secondary | ICD-10-CM

## 2017-06-11 MED ORDER — KETOROLAC TROMETHAMINE 30 MG/ML IJ SOLN
30.0000 mg | Freq: Once | INTRAMUSCULAR | Status: AC
  Administered 2017-06-11: 30 mg via INTRAVENOUS
  Filled 2017-06-11: qty 1

## 2017-06-11 MED ORDER — SODIUM CHLORIDE 0.9 % IV BOLUS (SEPSIS)
1000.0000 mL | Freq: Once | INTRAVENOUS | Status: AC
  Administered 2017-06-11: 1000 mL via INTRAVENOUS

## 2017-06-11 MED ORDER — PROCHLORPERAZINE EDISYLATE 5 MG/ML IJ SOLN
10.0000 mg | Freq: Once | INTRAMUSCULAR | Status: AC
  Administered 2017-06-11: 10 mg via INTRAVENOUS
  Filled 2017-06-11: qty 2

## 2017-06-11 MED ORDER — ASPIRIN-ACETAMINOPHEN-CAFFEINE 250-250-65 MG PO TABS: 1.0000 | ORAL_TABLET | Freq: Four times a day (QID) | ORAL | 0 refills | Status: DC | PRN

## 2017-06-11 NOTE — ED Triage Notes (Signed)
Pt complaint of continued migraine; hx of same. Pt continues to verbalize numbness around bridge of nose post wearing safety glasses.

## 2017-06-11 NOTE — Discharge Instructions (Signed)
It was my pleasure taking care of you today!  Drink plenty of fluids at home. This will help with your headache.  Please follow up with your primary care doctor. If you do not have one, please see the information below.    Fortunately, your evaluation today is reassuring with no apparent emergent cause for your headache at this time. With that being said, it is VERY important that you monitor your symptoms at home. If you develop worsening headache, new fever, new neck stiffness, rash, weakness, numbness, trouble with your speech, trouble walking, new or worsening symptoms or any concerning symptoms, please return to the ED immediately.   To find a primary care or specialty doctor please call [phone] or [phone] to access "[facility] Find a Doctor Service."  You may also go on the [facility] website at [email or web address]  There are also multiple [facility] practices throughout the [place] that are frequently accepting new patients. You may find a clinic that is close to your home and contact them.  [facility] - [address] [place] [phone]  [facility] in [place] (also locations in [place] and [place]) - [address] [phone]  [facility] - [address] [place] [phone]

## 2017-06-11 NOTE — ED Provider Notes (Signed)
[facility]-EMERGENCY DEPT Provider Note   CSN: [id number] Arrival date & time: 06/11/17  [id number]     History   Chief Complaint Chief Complaint  Patient presents with  . Headache    HPI Frances Murphy is a 26 y.o. female.  The history is provided by the patient and medical records. No language interpreter was used.   Frances Murphy is a 26 y.o. female  with a PMH of asthma and migraines who presents to the Emergency Department complaining of headache which began yesterday consistent with her typical migraines.  She states that she just started a new job which has been very stressful for her.  Associated symptoms include photophobia.  No fever, chills, abdominal pain, nausea, vomiting, neck pain, difficulty speaking or difficulty ambulating.  She took no medications prior to arrival for her symptoms.  She states that she was seen here about 2 months ago for the same and given a prescription for headache medication which was working well, however she ran out.  Per chart review, this was for Excedrin Migraine.    Past Medical History:  Diagnosis Date  . Asthma     Patient Active Problem List   Diagnosis Date Noted  . Preterm delivery, delivered 04/13/2013  . 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 Asthma 11/02/2012  . Obesity 11/02/2012  . Family history of breast cancer in mother 11/02/2012  . Current smoker 11/02/2012    Past Surgical History:  Procedure Laterality Date  . None      OB History    Gravida Para Term Preterm AB Living   1 1 0 1 0 0   SAB TAB Ectopic Multiple Live Births   0 0 0 0 1       Home Medications    Prior to Admission medications   Medication Sig Start Date End Date Taking? Authorizing Provider  Prenatal Vit-Fe Fumarate-FA (PRENATAL PO) Take 1 tablet daily by mouth.   Yes [provider]  albuterol (PROVENTIL HFA;VENTOLIN HFA) 108 (90 Base) MCG/ACT inhaler Inhale 2 puffs into the lungs every 4 (four) hours as  needed for shortness of breath. Patient not taking: Reported on 06/11/2017 07/17/16   Mesner, Barbara CowerJason, MD  aspirin-acetaminophen-caffeine (EXCEDRIN MIGRAINE) [phone] MG tablet Take 1 tablet every 6 (six) hours as needed by mouth. 06/11/17   Shauntee Karp, Chase PicketJaime Pilcher, PA-C  butalbital-aspirin-caffeine [facility](FIORINAL) [id number] MG capsule Take 1 capsule by mouth every 6 (six) hours as needed for headache. Patient not taking: Reported on 06/11/2017 04/08/17   Linwood DibblesKnapp, Jon, MD  clotrimazole-betamethasone (LOTRISONE) cream Apply to affected area 2 times daily prn Patient not taking: Reported on 06/11/2017 12/17/16   Street, [place], PA-C  guaiFENesin (ROBITUSSIN) 100 MG/5ML liquid Take 5-10 mLs (100-200 mg total) by mouth every 4 (four) hours as needed for cough. Patient not taking: Reported on 04/08/2017 07/17/16   Mesner, Barbara CowerJason, MD  hydrocortisone cream 1 % Apply to affected area 2 times daily Patient not taking: Reported on 04/08/2017 06/08/16   Felicie MornSmith, David, NP  hydrOXYzine (ATARAX/VISTARIL) 25 MG tablet Take 1 tablet (25 mg total) by mouth every 6 (six) hours as needed. Patient not taking: Reported on 04/08/2017 07/17/16   Mesner, Barbara CowerJason, MD  penicillin v potassium (VEETID) 500 MG tablet Take 2 tablets (1,000 mg total) by mouth 2 (two) times daily. X 7 days Patient not taking: Reported on 04/08/2017 11/05/16   Street, [place]cedes, PA-C  predniSONE (DELTASONE) 20 MG tablet 2 tabs po daily x 4 days Patient not  taking: Reported on 04/08/2017 07/18/16   Mesner, Barbara Cower, MD    Family History Family History  Problem Relation Age of Onset  . Hypertension Mother   . Cancer Mother   . Hypertension Father   . Hypertension Sister   . Hypertension Maternal Grandmother     Social History Social History   Tobacco Use  . Smoking status: Current Every Day Smoker    Packs/day: 0.25    Years: 7.00    Pack years: 1.75    Types: Cigarettes  . Smokeless tobacco: Never Used  Substance Use Topics  . Alcohol use: No  . Drug use: No      Allergies   Patient has no known allergies.   Review of Systems Review of Systems  Eyes: Positive for photophobia. Negative for pain, discharge, redness, itching and visual disturbance.  Musculoskeletal: Negative for back pain and neck pain.  Neurological: Positive for headaches. Negative for dizziness, speech difficulty and weakness.  All other systems reviewed and are negative.    Physical Exam Updated Vital Signs BP 129/89   Pulse 78   Temp 98.2 F (36.8 C)   Resp 16   LMP 06/05/2017   SpO2 97%   Physical Exam  Constitutional: She is oriented to person, place, and time. She appears well-developed and well-nourished. No distress.  HENT:  Head: Normocephalic and atraumatic.  Mouth/Throat: Oropharynx is clear and moist.  No tenderness of the temporal artery   Eyes: Conjunctivae and EOM are normal. Pupils are equal, round, and reactive to light. No scleral icterus.  No nystagmus   Neck: Normal range of motion. Neck supple.  Full active and passive ROM without pain.  No midline or paraspinal tenderness. No nuchal rigidity or meningeal signs.  Cardiovascular: Normal rate, regular rhythm, normal heart sounds and intact distal pulses.  Pulmonary/Chest: Effort normal and breath sounds normal. No respiratory distress. She has no wheezes. She has no rales.  Abdominal: Soft. Bowel sounds are normal. She exhibits no distension. There is no tenderness. There is no rebound and no guarding.  Musculoskeletal: Normal range of motion.  Lymphadenopathy:    She has no cervical adenopathy.  Neurological: She is alert and oriented to person, place, and time. She has normal reflexes. No cranial nerve deficit. Coordination normal.  Alert, oriented, thought content appropriate, able to give a coherent history. Speech is clear and goal oriented, able to follow commands.  Cranial Nerves:  II:  Peripheral visual fields grossly normal, pupils equal, round, reactive to light III, IV, VI: EOM  intact bilaterally, ptosis not present V,VII: smile symmetric, eyes kept closed tightly against resistance, facial light touch sensation equal VIII: hearing grossly normal IX, X: symmetric soft palate movement, uvula elevates symmetrically  XI: bilateral shoulder shrug symmetric and strong XII: midline tongue extension 5/5 muscle strength in upper and lower extremities bilaterally including strong and equal grip strength and dorsiflexion/plantar flexion Sensory to light touch normal in all four extremities.  Normal finger-to-nose and rapid alternating movements. No drift. Steady gait.  Skin: Skin is warm and dry. No rash noted. She is not diaphoretic.  Nursing note and vitals reviewed.    ED Treatments / Results  Labs (all labs ordered are listed, but only abnormal results are displayed) Labs Reviewed - No data to display  EKG  EKG Interpretation None       Radiology No results found.  Procedures Procedures (including critical care time)  Medications Ordered in ED Medications  sodium chloride 0.9 % bolus 1,000  mL (1,000 mLs Intravenous New Bag/Given 06/11/17 1222)  ketorolac (TORADOL) 30 MG/ML injection 30 mg (30 mg Intravenous Given 06/11/17 1229)  prochlorperazine (COMPAZINE) injection 10 mg (10 mg Intravenous Given 06/11/17 1229)     Initial Impression / Assessment and Plan / ED Course  I have reviewed the triage vital signs and the nursing notes.  Pertinent labs & imaging results that were available during my care of the patient were reviewed by me and considered in my medical decision making (see chart for details).    Frances Murphy is a 26 y.o. female who presents to ED for headache  c/w their typical migraine. No focal neuro deficits on exam.  Migraine cocktail and fluids given.   On re-evaluation, patient's headache has resolved. The patient denies any neurologic symptoms such as visual changes, focal numbness/weakness, balance problems, confusion, or speech  difficulty to suggest a life-threatening intracranial process such as intracranial hemorrhage or mass. The patient has no clotting risk factors thus venous sinus thrombosis is unlikely. No fevers, neck pain or nuchal rigidity to suggest meningitis. I feel that the patient is safe for discharge home at this time. PCP follow up strongly encouraged and information provided. I have reviewed return precautions including development of neurologic symptoms, confusion, lethargy, difficulty speaking, or new/worsening/concerning symptoms. All questions answered.   Final Clinical Impressions(s) / ED Diagnoses   Final diagnoses:  Bad headache    ED Discharge Orders        Ordered    aspirin-acetaminophen-caffeine [facility](EXCEDRIN MIGRAINE) 250-250-65 MG tablet  Every 6 hours PRN     06/11/17 1317       Santanna Whitford, Chase PicketJaime Pilcher, PA-C 06/11/17 1325    Shaune PollackIsaacs, Cameron, MD 06/12/17 1348

## 2017-06-18 ENCOUNTER — Emergency Department (HOSPITAL_COMMUNITY)
Admission: EM | Admit: 2017-06-18 | Discharge: 2017-06-18 | Disposition: A | Discharge status: Home / Self Care | Payer: Self-pay | Attending: Emergency Medicine | Admitting: Emergency Medicine

## 2017-06-18 ENCOUNTER — Encounter (HOSPITAL_COMMUNITY): Payer: Self-pay | Admitting: Nurse Practitioner

## 2017-06-18 DIAGNOSIS — J45909 Unspecified asthma, uncomplicated: Secondary | ICD-10-CM | POA: Insufficient documentation

## 2017-06-18 DIAGNOSIS — Z79899 Other long term (current) drug therapy: Secondary | ICD-10-CM | POA: Insufficient documentation

## 2017-06-18 DIAGNOSIS — R51 Headache: Secondary | ICD-10-CM | POA: Insufficient documentation

## 2017-06-18 DIAGNOSIS — F17210 Nicotine dependence, cigarettes, uncomplicated: Secondary | ICD-10-CM | POA: Insufficient documentation

## 2017-06-18 DIAGNOSIS — R519 Headache, unspecified: Secondary | ICD-10-CM

## 2017-06-18 MED ORDER — KETOROLAC TROMETHAMINE 30 MG/ML IJ SOLN
30.0000 mg | Freq: Once | INTRAMUSCULAR | Status: AC
  Administered 2017-06-18: 30 mg via INTRAMUSCULAR
  Filled 2017-06-18: qty 1

## 2017-06-18 MED ORDER — BUTALBITAL-ASPIRIN-CAFFEINE 50-325-40 MG PO CAPS: 1.0000 | ORAL_CAPSULE | Freq: Four times a day (QID) | ORAL | 0 refills | Status: AC | PRN

## 2017-06-18 MED ORDER — PROCHLORPERAZINE MALEATE 10 MG PO TABS
10.0000 mg | ORAL_TABLET | Freq: Once | ORAL | Status: AC
  Administered 2017-06-18: 10 mg via ORAL
  Filled 2017-06-18: qty 1

## 2017-06-18 MED ORDER — ACETAMINOPHEN 500 MG PO TABS
1000.0000 mg | ORAL_TABLET | Freq: Once | ORAL | Status: AC
  Administered 2017-06-18: 1000 mg via ORAL
  Filled 2017-06-18: qty 2

## 2017-06-18 NOTE — ED Notes (Addendum)
Patient states she does not want IV migraine cocktail, but just wants Fiorinal because excedrin does not work.

## 2017-06-18 NOTE — Discharge Instructions (Signed)
For headaches, try to cut back on ibuprofen and tylenol as these are likely causing your rebound headaches. You can start to cut back on doses by taking half of your current dose and then cutting back to a fourth, until you can wean yourself off.   Fioricet is not a harmless drug.  It has side effects including dizziness, drowsiness, intoxication, abdominal pain, nausea, vomiting, increased heart rate, sedation and respiratory suppression. It has a risk of overdose, respiratory problems and death if taken incorrectly.   If you have recurrent headaches you need neurology to evaluate you, you can be started on maintenance medications that are safer than ibuprofen, tylenol and fioricet.

## 2017-06-18 NOTE — ED Provider Notes (Signed)
[facility]-EMERGENCY DEPT Provider Note   CSN: [id number] Arrival date & time: 06/18/17  1703     History   Chief Complaint Chief Complaint  Patient presents with  . Headache    HPI Frances Murphy is a 26 y.o. female w/ h/o migraines and asthma presents to ED for headache.  She is requesting refill for prescription of fioricet which help with headaches. Reports almost daily headaches and today's headache is similar to previous. Takes tylenol as needed for pain which mildly alleviates headache only temporarily, has better relief of headache with fioricet but has ran out. Take fioricet only at night time and tylenol during the day. States she avoid ibuprofen because she was told it can cause stomach problems. Does report increased stress and associated bilateral neck and trapezius pain that is better with massage. Associated symptoms include intermittent blurred vision bilaterally that she always has with her headaches. No family h/o of migraines.   No head trauma, dizziness, seizure, neck stiffness/rigidity, generalized rash, fevers, chills, anticoagulants, h/o DVT/PE. No numbness or weakness focally. She does not want to stay for IV migraine cocktail and prefers oral medications.   HPI  Past Medical History:  Diagnosis Date  . Asthma     Patient Active Problem List   Diagnosis Date Noted  . Preterm delivery, delivered 04/13/2013  . 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 Asthma 11/02/2012  . Obesity 11/02/2012  . Family history of breast cancer in mother 11/02/2012  . Current smoker 11/02/2012    Past Surgical History:  Procedure Laterality Date  . None      OB History    Gravida Para Term Preterm AB Living   1 1 0 1 0 0   SAB TAB Ectopic Multiple Live Births   0 0 0 0 1       Home Medications    Prior to Admission medications   Medication Sig Start Date End Date Taking? Authorizing Provider  diphenhydramine-acetaminophen (TYLENOL PM)  25-500 MG TABS tablet Take 1 tablet at bedtime as needed by mouth (pain).   Yes [provider]  Prenatal Vit-Fe Fumarate-FA (PRENATAL PO) Take 1 tablet daily by mouth.   Yes [provider]  albuterol (PROVENTIL HFA;VENTOLIN HFA) 108 (90 Base) MCG/ACT inhaler Inhale 2 puffs into the lungs every 4 (four) hours as needed for shortness of breath. Patient not taking: Reported on 06/11/2017 07/17/16   Mesner, Barbara CowerJason, MD  aspirin-acetaminophen-caffeine (EXCEDRIN MIGRAINE) [phone] MG tablet Take 1 tablet every 6 (six) hours as needed by mouth. Patient not taking: Reported on 06/18/2017 06/11/17   Ward, Chase PicketJaime Pilcher, PA-C  butalbital-aspirin-caffeine [facility](FIORINAL) [phone] MG capsule Take 1 capsule every 6 (six) hours as needed for up to 3 days by mouth for headache. 06/18/17 06/21/17  Liberty HandyGibbons, Tania Perrott J, PA-C  clotrimazole-betamethasone (LOTRISONE) cream Apply to affected area 2 times daily prn Patient not taking: Reported on 06/11/2017 12/17/16   Street, [place], PA-C  guaiFENesin (ROBITUSSIN) 100 MG/5ML liquid Take 5-10 mLs (100-200 mg total) by mouth every 4 (four) hours as needed for cough. Patient not taking: Reported on 04/08/2017 07/17/16   Mesner, Barbara CowerJason, MD  hydrocortisone cream 1 % Apply to affected area 2 times daily Patient not taking: Reported on 04/08/2017 06/08/16   Felicie MornSmith, David, NP  hydrOXYzine (ATARAX/VISTARIL) 25 MG tablet Take 1 tablet (25 mg total) by mouth every 6 (six) hours as needed. Patient not taking: Reported on 04/08/2017 07/17/16   Mesner, Barbara CowerJason, MD  penicillin v potassium (VEETID)  500 MG tablet Take 2 tablets (1,000 mg total) by mouth 2 (two) times daily. X 7 days Patient not taking: Reported on 04/08/2017 11/05/16   Street, [place], PA-C  predniSONE (DELTASONE) 20 MG tablet 2 tabs po daily x 4 days Patient not taking: Reported on 04/08/2017 07/18/16   Mesner, Barbara Cower, MD    Family History Family History  Problem Relation Age of Onset  . Hypertension Mother   .  Cancer Mother   . Hypertension Father   . Hypertension Sister   . Hypertension Maternal Grandmother     Social History Social History   Tobacco Use  . Smoking status: Current Every Day Smoker    Packs/day: 0.25    Years: 7.00    Pack years: 1.75    Types: Cigarettes  . Smokeless tobacco: Never Used  Substance Use Topics  . Alcohol use: No  . Drug use: No     Allergies   Patient has no known allergies.   Review of Systems Review of Systems  Constitutional: Negative for fever.  HENT: Negative for congestion, sinus pressure and sinus pain.   Eyes: Positive for visual disturbance. Negative for photophobia, pain and redness.  Respiratory: Negative for cough.   Cardiovascular: Negative for chest pain.  Gastrointestinal: Negative for nausea and vomiting.  Genitourinary: Negative for difficulty urinating.  Musculoskeletal: Positive for myalgias and neck pain. Negative for neck stiffness.  Allergic/Immunologic: Negative for immunocompromised state.  Neurological: Positive for headaches. Negative for seizures, syncope, speech difficulty, weakness, light-headedness and numbness.     Physical Exam Updated Vital Signs BP (!) 147/106 (BP Location: Left Arm)   Pulse 75   Temp 98.8 F (37.1 C) (Oral)   Resp 18   LMP 06/05/2017   SpO2 100%   Physical Exam  Constitutional: She is oriented to person, place, and time. She appears well-developed and well-nourished.  Non-toxic appearance. No distress.  NAD.  HENT:  Head: Normocephalic and atraumatic.  Right Ear: External ear normal.  Left Ear: External ear normal.  Nose: Nose normal. No mucosal edema or septal deviation.  Moist mucous membranes Uvula midline Oropharynx and tonsils normal No tenderness over temporal arteries  Eyes: Conjunctivae, EOM and lids are normal. No scleral icterus.  Unable to visualize back of eye  Neck: Normal range of motion. Neck supple.  No c spine spinous process or muscular tenderness  Full  PROM of neck w/o rigidity  No meningeal signs   Cardiovascular: Normal rate, regular rhythm and normal heart sounds.  No murmur heard. Pulses:      Radial pulses are 2+ on the right side, and 2+ on the left side.       Dorsalis pedis pulses are 2+ on the right side, and 2+ on the left side.  Pulmonary/Chest: Effort normal and breath sounds normal. She has no wheezes.  Musculoskeletal: Normal range of motion. She exhibits no deformity.  Lymphadenopathy:  No cervical adenopathy  Neurological: She is alert and oriented to person, place, and time. GCS eye subscore is 4. GCS verbal subscore is 5. GCS motor subscore is 6.  Speech and phonation normal.  Strength 5/5 with hand grip and ankle flexion/extension.   Sensation to light touch intact in hands and feet. No truncal sway.   Negative Romberg. No leg drift.  Intact finger to nose test. CN I not tested CN II full visual fields bilaterally CN III, IV, VI PEERL and EOMs intact bilaterally CN V light touch intact in all 3 divisions of trigeminal  nerve CN VII facial nerve movements intact, symmetric, bilaterally CN VIII not tested CN IX, X no uvula deviation, symmetric soft palate rise CN XI 5/5 SCM and trapezius strength bilaterally  CN XII Tongue midline with symmetric L/R movement  Skin: Skin is warm and dry. Capillary refill takes less than 2 seconds. No rash noted.  Psychiatric: She has a normal mood and affect. Her speech is normal and behavior is normal. Judgment and thought content normal.  Nursing note and vitals reviewed.    ED Treatments / Results  Labs (all labs ordered are listed, but only abnormal results are displayed) Labs Reviewed - No data to display  EKG  EKG Interpretation None       Radiology No results found.  Procedures Procedures (including critical care time)  Medications Ordered in ED Medications  ketorolac (TORADOL) 30 MG/ML injection 30 mg (30 mg Intramuscular Given 06/18/17 1936)    prochlorperazine (COMPAZINE) tablet 10 mg (10 mg Oral Given 06/18/17 1936)  acetaminophen (TYLENOL) tablet 1,000 mg (1,000 mg Oral Given 06/18/17 1936)     Initial Impression / Assessment and Plan / ED Course  I have reviewed the triage vital signs and the nursing notes.  Pertinent labs & imaging results that were available during my care of the patient were reviewed by me and considered in my medical decision making (see chart for details).     Patient is a 26 y.o. yo female with ppmh who presents with headache. Pt reports current headache is "just like previous migraines".  Patient is without high-risk features of headache including: sudden onset/thunderclap HA, AMS, seizure, headache with exertion, age > 4050, history of immunocompromise, neck rigidity, fever, use of anticoagulation, FMH of spontaneous SAH, concomitant drug use or toxic exposure, trauma, h/o CVA/TIA or HTN. On exam VS are WNL, pt is well-appearing w/ no meningismus, nystagmus, focal neuro deficits, pain over temporal arteries.    Given reassuring hx and exam, emergent imaging or labs not indicated given. Low suspicion for emergent intracranial or vascular etiology.  Doubt meningitis.  Pt given oral migraine cocktail because she declined IV. Rx for fioricet given, discussed side effects of chronic use of fioricet and NSAIDs. Encouraged her to f/u with neurology. Discussed s/s that would warrant return to ED. Pt verbalized understanding and agreeable with ED tx and dc plan.   Final Clinical Impressions(s) / ED Diagnoses   Final diagnoses:  Bad headache    ED Discharge Orders        Ordered    butalbital-aspirin-caffeine [facility](FIORINAL) 50-325-40 MG capsule  Every 6 hours PRN     06/18/17 1950       Liberty HandyGibbons, Preet Perrier J, PA-C 06/18/17 Candie Echevaria1957    Pfeiffer, Marcy, MD 06/20/17 [phone]

## 2017-06-18 NOTE — ED Triage Notes (Signed)
Pt states she is having a headache/migrane, adding that she usually take Fiorinal and it helps but she has been out and would like a prescription for it.

## 2017-08-30 ENCOUNTER — Emergency Department (HOSPITAL_COMMUNITY)
Admission: EM | Admit: 2017-08-30 | Discharge: 2017-08-31 | Disposition: A | Discharge status: Home / Self Care | Payer: Self-pay | Attending: Emergency Medicine | Admitting: Emergency Medicine

## 2017-08-30 ENCOUNTER — Encounter (HOSPITAL_COMMUNITY): Payer: Self-pay | Admitting: Family Medicine

## 2017-08-30 DIAGNOSIS — N898 Other specified noninflammatory disorders of vagina: Secondary | ICD-10-CM | POA: Insufficient documentation

## 2017-08-30 DIAGNOSIS — Z5321 Procedure and treatment not carried out due to patient leaving prior to being seen by health care provider: Secondary | ICD-10-CM | POA: Insufficient documentation

## 2017-08-30 LAB — URINALYSIS, ROUTINE W REFLEX MICROSCOPIC
Bilirubin Urine: NEGATIVE
GLUCOSE, UA: NEGATIVE mg/dL
KETONES UR: NEGATIVE mg/dL
Leukocytes, UA: NEGATIVE
Nitrite: NEGATIVE
PH: 5 (ref 5.0–8.0)
Protein, ur: NEGATIVE mg/dL
SPECIFIC GRAVITY, URINE: 1.016 (ref 1.005–1.030)

## 2017-08-30 LAB — POC URINE PREG, ED: PREG TEST UR: NEGATIVE

## 2017-08-30 NOTE — ED Triage Notes (Signed)
Patient is complaining of vaginal itching and itching between breast for the last 2 weeks. Denies recently being on any antibiotics. Patient denies any vaginal discharge or urinary symptoms. Patient reports she has had these same symptoms before and dx with yeast infection.

## 2017-08-30 NOTE — ED Notes (Signed)
Called for room placement x2 without response 

## 2017-08-30 NOTE — ED Notes (Signed)
Called Pt in lobby for vital recheck, no response X1 

## 2017-09-13 ENCOUNTER — Encounter (HOSPITAL_COMMUNITY): Payer: Self-pay

## 2017-09-13 ENCOUNTER — Emergency Department (HOSPITAL_COMMUNITY)
Admission: EM | Admit: 2017-09-13 | Discharge: 2017-09-13 | Disposition: A | Discharge status: Home / Self Care | Payer: Self-pay | Attending: Emergency Medicine | Admitting: Emergency Medicine

## 2017-09-13 ENCOUNTER — Other Ambulatory Visit: Payer: Self-pay

## 2017-09-13 DIAGNOSIS — Z79899 Other long term (current) drug therapy: Secondary | ICD-10-CM | POA: Insufficient documentation

## 2017-09-13 DIAGNOSIS — K047 Periapical abscess without sinus: Secondary | ICD-10-CM | POA: Insufficient documentation

## 2017-09-13 DIAGNOSIS — M792 Neuralgia and neuritis, unspecified: Secondary | ICD-10-CM | POA: Insufficient documentation

## 2017-09-13 DIAGNOSIS — F17210 Nicotine dependence, cigarettes, uncomplicated: Secondary | ICD-10-CM | POA: Insufficient documentation

## 2017-09-13 DIAGNOSIS — J45909 Unspecified asthma, uncomplicated: Secondary | ICD-10-CM | POA: Insufficient documentation

## 2017-09-13 MED ORDER — PENICILLIN V POTASSIUM 500 MG PO TABS: 500.0000 mg | ORAL_TABLET | Freq: Four times a day (QID) | ORAL | 0 refills | Status: AC

## 2017-09-13 MED ORDER — NAPROXEN 500 MG PO TABS
500.0000 mg | ORAL_TABLET | Freq: Once | ORAL | Status: AC
  Administered 2017-09-13: 500 mg via ORAL
  Filled 2017-09-13: qty 1

## 2017-09-13 MED ORDER — IBUPROFEN 600 MG PO TABS: 600.0000 mg | ORAL_TABLET | Freq: Four times a day (QID) | ORAL | 0 refills | Status: DC | PRN

## 2017-09-13 NOTE — ED Provider Notes (Signed)
[facility]-EMER[facility] DEPT Provider Note   CSN: [id number] Arrival date & time: 09/13/17  1223     History   Chief Complaint Chief Complaint  Patient presents with  . Dental Pain  . Facial Pain    HPI Frances Murphy is a 27 y.o. female.  HPI  Pt with hx of asthma and migraines comes in with cc of toothache and R sided facial pain. Pt has hx of wisdom tooth problem on the R side, and it has flaired up about 3 days ago. PT is complaining of constant, throbbing pain, which is affecting the R side of the face. PT denies any headaches, n/v/f/c/ams. There is heat/cold sensitivity. Pt is not pregnant.  Past Medical History:  Diagnosis Date  . Asthma     Patient Active Problem List   Diagnosis Date Noted  . Preterm delivery, delivered 04/13/2013  . 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 Asthma 11/02/2012  . Obesity 11/02/2012  . Family history of breast cancer in mother 11/02/2012  . Current smoker 11/02/2012    Past Surgical History:  Procedure Laterality Date  . None      OB History    Gravida Para Term Preterm AB Living   1 1 0 1 0 0   SAB TAB Ectopic Multiple Live Births   0 0 0 0 1       Home Medications    Prior to Admission medications   Medication Sig Start Date End Date Taking? Authorizing Provider  albuterol (PROVENTIL HFA;VENTOLIN HFA) 108 (90 Base) MCG/ACT inhaler Inhale 2 puffs into the lungs every 4 (four) hours as needed for shortness of breath. Patient not taking: Reported on 06/11/2017 07/17/16   Mesner, Barbara CowerJason, MD  aspirin-acetaminophen-caffeine (EXCEDRIN MIGRAINE) [phone] MG tablet Take 1 tablet every 6 (six) hours as needed by mouth. Patient not taking: Reported on 06/18/2017 06/11/17   Ward, Chase PicketJaime Pilcher, PA-C  clotrimazole-betamethasone (LOTRISONE) cream Apply to affected area 2 times daily prn Patient not taking: Reported on 06/11/2017 12/17/16   Street, [place]ph[place] WestMercedes, PA-C  diphenhydramine-acetaminophen (TYLENOL PM)  25-500 MG TABS tablet Take 1 tablet at bedtime as needed by mouth (pain).    [provider]  guaiFENesin (ROBITUSSIN) 100 MG/5ML liquid Take 5-10 mLs (100-200 mg total) by mouth every 4 (four) hours as needed for cough. Patient not taking: Reported on 04/08/2017 07/17/16   Mesner, Barbara CowerJason, MD  hydrocortisone cream 1 % Apply to affected area 2 times daily Patient not taking: Reported on 04/08/2017 06/08/16   Felicie MornSmith, David, NP  hydrOXYzine (ATARAX/VISTARIL) 25 MG tablet Take 1 tablet (25 mg total) by mouth every 6 (six) hours as needed. Patient not taking: Reported on 04/08/2017 07/17/16   Mesner, Barbara CowerJason, MD  ibuprofen (ADVIL,MOTRIN) 600 MG tablet Take 1 tablet (600 mg total) by mouth every 6 (six) hours as needed. 09/13/17   Derwood KaplanNanavati, Tykesha Konicki, MD  penicillin v potassium (VEETID) 500 MG tablet Take 1 tablet (500 mg total) by mouth 4 (four) times daily for 7 days. 09/13/17 09/20/17  Derwood KaplanNanavati, Kachina Niederer, MD  predniSONE (DELTASONE) 20 MG tablet 2 tabs po daily x 4 days Patient not taking: Reported on 04/08/2017 07/18/16   Mesner, Barbara CowerJason, MD  Prenatal Vit-Fe Fumarate-FA (PRENATAL PO) Take 1 tablet daily by mouth.    [provider]    Family History Family History  Problem Relation Age of Onset  . Hypertension Mother   . Cancer Mother   . Hypertension Father   . Hypertension Sister   .  Hypertension Maternal Grandmother     Social History Social History   Tobacco Use  . Smoking status: Current Every Day Smoker    Packs/day: 0.25    Years: 7.00    Pack years: 1.75    Types: Cigarettes  . Smokeless tobacco: Never Used  Substance Use Topics  . Alcohol use: No  . Drug use: No     Allergies   Patient has no known allergies.   Review of Systems Review of Systems  Constitutional: Positive for activity change. Negative for fever.  HENT: Positive for dental problem. Negative for drooling, ear pain, facial swelling, trouble swallowing and voice change.   Musculoskeletal: Negative for neck  pain and neck stiffness.  Allergic/Immunologic: Negative for immunocompromised state.     Physical Exam Updated Vital Signs BP (!) 146/93   Pulse 84   Temp 98.8 F (37.1 C)   Resp 15   Ht 5\' 7"  (1.702 m)   Wt 118.1 kg (260 lb 7 oz)   LMP 09/13/2017   SpO2 98%   BMI 40.79 kg/m   Physical Exam  Constitutional: She is oriented to person, place, and time. She appears well-developed.  HENT:  Head: Normocephalic and atraumatic.  Pt has no trismus, stridor or crepitus over the neck. Pt has no tonsillar enlargement and exudates. No facial swelling.  Pt is noted to have tooth # 32 erosion. There is tenderness to palpation in that region. + gingival edema w/o fluctuance.  Eyes: EOM are normal.  Neck: Normal range of motion. Neck supple.  Cardiovascular: Normal rate.  Pulmonary/Chest: Effort normal.  Abdominal: Bowel sounds are normal.  Neurological: She is alert and oriented to person, place, and time.  Skin: Skin is warm and dry.  Nursing note and vitals reviewed.    ED Treatments / Results  Labs (all labs ordered are listed, but only abnormal results are displayed) Labs Reviewed - No data to display  EKG  EKG Interpretation None       Radiology No results found.  Procedures Procedures (including critical care time)  Medications Ordered in ED Medications  naproxen (NAPROSYN) tablet 500 mg (not administered)     Initial Impression / Assessment and Plan / ED Course  I have reviewed the triage vital signs and the nursing notes.  Pertinent labs & imaging results that were available during my care of the patient were reviewed by me and considered in my medical decision making (see chart for details).     DDx includes: - Periapical tooth infection - Dental abscess - Gingivitis - Dental trauma - Pulpitis - Nerve root compression  Pt comes in with cc of toothache and R sided facial pain. It appears based on exam that pt is having neuropathic pain due to  dental erosion. There is no visible pulp at this time, there is certainly no abscess of [place] to drain. PT is immunocompetent and there is no red flags that show deep infection of the face.  We will start on antibiotics. Dental resources in the community provided with.  Final Clinical Impressions(s) / ED Diagnoses   Final diagnoses:  Dental infection  Neuropathic pain    ED Discharge Orders        Ordered    penicillin v potassium (VEETID) 500 MG tablet  4 times daily     09/13/17 1311    ibuprofen (ADVIL,MOTRIN) 600 MG tablet  Every 6 hours PRN     09/13/17 1311       Amand Lemoine,  MD 09/13/17 1318

## 2017-09-13 NOTE — Discharge Instructions (Signed)
We saw you in the ER for the toothache. No evidence of deep infection, no evidence of pus that can be drained out. YOU WILL NEED TO SEE A DENTIST to get appropriate care. I HAVE PRINTED OUT SOME RESOURCES FOR YOU IN OUR COMMUNITY. Please take the antibiotics and pain meds provided in the interval.  Return to the ER if there is drooling, difficulty breathing, difficulty opening mouth, fevers, chills, confusion, headaches, seizures.

## 2017-09-13 NOTE — ED Triage Notes (Addendum)
Patient c/o right facial swelling and right lower dental pain x 3-4 days. Patient has reccuring pain and swelling.

## 2017-09-15 ENCOUNTER — Encounter (HOSPITAL_COMMUNITY): Payer: Self-pay

## 2017-09-15 ENCOUNTER — Emergency Department (HOSPITAL_COMMUNITY)
Admission: EM | Admit: 2017-09-15 | Discharge: 2017-09-15 | Disposition: A | Discharge status: Home / Self Care | Payer: Self-pay | Attending: Emergency Medicine | Admitting: Emergency Medicine

## 2017-09-15 DIAGNOSIS — K0889 Other specified disorders of teeth and supporting structures: Secondary | ICD-10-CM | POA: Insufficient documentation

## 2017-09-15 DIAGNOSIS — R51 Headache: Secondary | ICD-10-CM | POA: Insufficient documentation

## 2017-09-15 DIAGNOSIS — E876 Hypokalemia: Secondary | ICD-10-CM | POA: Insufficient documentation

## 2017-09-15 DIAGNOSIS — F17210 Nicotine dependence, cigarettes, uncomplicated: Secondary | ICD-10-CM | POA: Insufficient documentation

## 2017-09-15 DIAGNOSIS — R519 Headache, unspecified: Secondary | ICD-10-CM

## 2017-09-15 LAB — CBC WITH DIFFERENTIAL/PLATELET
BASOS ABS: 0.1 10*3/uL (ref 0.0–0.1)
Basophils Relative: 1 %
Eosinophils Absolute: 0.2 10*3/uL (ref 0.0–0.7)
Eosinophils Relative: 3 %
HCT: 36.5 % (ref 36.0–46.0)
HEMOGLOBIN: 12.4 g/dL (ref 12.0–15.0)
LYMPHS ABS: 4.3 10*3/uL — AB (ref 0.7–4.0)
LYMPHS PCT: 45 %
MCH: 28.8 pg (ref 26.0–34.0)
MCHC: 34 g/dL (ref 30.0–36.0)
MCV: 84.9 fL (ref 78.0–100.0)
Monocytes Absolute: 0.5 10*3/uL (ref 0.1–1.0)
Monocytes Relative: 5 %
NEUTROS PCT: 46 %
Neutro Abs: 4.5 10*3/uL (ref 1.7–7.7)
Platelets: 256 10*3/uL (ref 150–400)
RBC: 4.3 MIL/uL (ref 3.87–5.11)
RDW: 13.4 % (ref 11.5–15.5)
WBC: 9.6 10*3/uL (ref 4.0–10.5)

## 2017-09-15 LAB — BASIC METABOLIC PANEL
ANION GAP: 11 (ref 5–15)
BUN: 8 mg/dL (ref 6–20)
CALCIUM: 9.2 mg/dL (ref 8.9–10.3)
CHLORIDE: 103 mmol/L (ref 101–111)
CO2: 26 mmol/L (ref 22–32)
Creatinine, Ser: 0.8 mg/dL (ref 0.44–1.00)
GFR calc non Af Amer: >60 mL/min (ref >60)
Glucose, Bld: 90 mg/dL (ref 65–99)
Potassium: 3.1 mmol/L — ABNORMAL LOW (ref 3.5–5.1)
SODIUM: 140 mmol/L (ref 135–145)

## 2017-09-15 LAB — I-STAT BETA HCG BLOOD, ED (MC, WL, AP ONLY)

## 2017-09-15 MED ORDER — METHYLPREDNISOLONE SODIUM SUCC 125 MG IJ SOLR
125.0000 mg | Freq: Once | INTRAMUSCULAR | Status: AC
  Administered 2017-09-15: 125 mg via INTRAVENOUS
  Filled 2017-09-15: qty 2

## 2017-09-15 MED ORDER — POTASSIUM CHLORIDE ER 10 MEQ PO TBCR: 20.0000 meq | EXTENDED_RELEASE_TABLET | Freq: Two times a day (BID) | ORAL | 0 refills | Status: DC

## 2017-09-15 MED ORDER — SODIUM CHLORIDE 0.9 % IV BOLUS (SEPSIS)
1000.0000 mL | Freq: Once | INTRAVENOUS | Status: AC
  Administered 2017-09-15: 1000 mL via INTRAVENOUS

## 2017-09-15 MED ORDER — METOCLOPRAMIDE HCL 5 MG/ML IJ SOLN
10.0000 mg | Freq: Once | INTRAMUSCULAR | Status: AC
  Administered 2017-09-15: 10 mg via INTRAVENOUS
  Filled 2017-09-15: qty 2

## 2017-09-15 MED ORDER — KETOROLAC TROMETHAMINE 30 MG/ML IJ SOLN
30.0000 mg | Freq: Once | INTRAMUSCULAR | Status: AC
  Administered 2017-09-15: 30 mg via INTRAVENOUS
  Filled 2017-09-15: qty 1

## 2017-09-15 MED ORDER — POTASSIUM CHLORIDE CRYS ER 20 MEQ PO TBCR
40.0000 meq | EXTENDED_RELEASE_TABLET | Freq: Once | ORAL | Status: AC
  Administered 2017-09-15: 40 meq via ORAL
  Filled 2017-09-15: qty 2

## 2017-09-15 NOTE — Discharge Instructions (Addendum)
Your potassium was noted to be lower than normal today. Take the potassium supplement, as prescribed. Have your potassium retested in about a week. You should also follow up with a primary care provider on this matter.  Your lab results showed no other acute abnormalities.  For future headaches please try the following regimen: Antiinflammatory medications: Take 600 mg of ibuprofen every 6 hours or 440 mg (over the counter dose) to 500 mg (prescription dose) of naproxen every 12 hours for the next 3 days. After this time, these medications may be used as needed for pain. Take these medications with food to avoid upset stomach. Choose only one of these medications, do not take them together. Tylenol: Should you continue to have additional pain while taking the ibuprofen or naproxen, you may add in tylenol as needed. Your daily total maximum amount of tylenol from all sources should be limited to 4000mg /day for persons without liver problems, or 2000mg /day for those with liver problems.  Hydration: Have a goal of about a half liter of water every couple hours to stay well hydrated.   Sleep: Please be sure to get plenty of sleep with a goal of 8 hours per night. Having a regular bed time and bedtime routine can help with this.  Screens: Reduce the amount of time you are in front of screens.  Take about a 5-10-minute break every hour or every couple hours to give your eyes rest.  Do not use screens in dark rooms.  Glasses with a blue light filter may also help reduce eye fatigue.  Stress: Take steps to reduce stress as much as possible.   Follow up: Follow-up with your primary care provider on this issue.  May also need to follow-up with the neurologist for increased frequency of headaches.  Dental Pain You have been seen today for dental pain. You should follow up with a dentist as soon as possible. This problem will not resolve on its own without the care of a dentist. Use ibuprofen or naproxen for  pain.   Please take all of your antibiotics until finished!   You may develop abdominal discomfort or diarrhea from the antibiotic.  You may help offset this with probiotics which you can buy or get in yogurt. Do not eat or take the probiotics until 2 hours after your antibiotic.   Dental Resource Guide  [facility] [address], [address] [place], [place] [address] [phone]  [facility] [address] [place], [place] [address] [phone]  [facility] [address] [place], [place] [address] [phone]  [facility] [address] [place], [place] [phone]  [facility] [address] [place], [place] [address] [phone]  [facility] [email or web address]  [employer] [address] [facility] [place], [place] [address] [phone]  Website for free, low-income, or sliding scale dental services in [place]: www.freedental.us  To find a dentist in [place] and surrounding areas: [email or web address]/[email or web address]-the-public/find-[email or web address]-dentist  [facility] [employer][email or web address]://[email or web address]/meetings-events/[place]-missions-[email or web address]-mercy  [facility] [place] [facility] Dentist http://www.[email or web address]/https://dma.[email or web address]/find-[email or web address]-doctor/medicaid-dental-providers

## 2017-09-15 NOTE — ED Provider Notes (Signed)
[facility]-EMERGENCY DEPT Provider Note   CSN: [id number] Arrival date & time: 09/15/17  0435     History   Chief Complaint Chief Complaint  Patient presents with  . Headache    HPI Frances Murphy is a 27 y.o. female.  HPI  Frances Murphy is a 27 y.o. female, with a history of asthma and headaches, presenting to the ED with headache. States pain has been present for the past two days, intermittent, sharp, 10/10, present to the right temporal and bilateral frontal regions. Has had similar headaches in the past.  Has taken excedrin without relief. States she was diagnosed with migraines about a month ago in the ED and advised to follow-up with a PCP.  She did not do so. Was seen for right lower dental pain two days ago and thinks her headache may be related.  She was advised to follow-up with a dentist.  She did not do so.  She was also prescribed penicillin, but has not been taking this medication. Denies fever/chills, nausea/vomiting, sore throat, neck pain/stiffness, facial swelling, difficulty breathing, neuro deficits, or any other complaints.  Patient is not breastfeeding.  Past Medical History:  Diagnosis Date  . Asthma     Patient Active Problem List   Diagnosis Date Noted  . Preterm delivery, delivered 04/13/2013  . 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 Asthma 11/02/2012  . Obesity 11/02/2012  . Family history of breast cancer in mother 11/02/2012  . Current smoker 11/02/2012    Past Surgical History:  Procedure Laterality Date  . None      OB History    Gravida Para Term Preterm AB Living   1 1 0 1 0 0   SAB TAB Ectopic Multiple Live Births   0 0 0 0 1       Home Medications    Prior to Admission medications   Medication Sig Start Date End Date Taking? Authorizing Provider  aspirin-acetaminophen-caffeine (EXCEDRIN MIGRAINE) [phone] MG tablet Take 1 tablet every 6 (six) hours as needed by mouth. 06/11/17  Yes Ward, Chase Picket, PA-C  albuterol (PROVENTIL HFA;VENTOLIN HFA) 108 (90 Base) MCG/ACT inhaler Inhale 2 puffs into the lungs every 4 (four) hours as needed for shortness of breath. Patient not taking: Reported on 06/11/2017 07/17/16   Mesner, Barbara Cower, MD  clotrimazole-betamethasone (LOTRISONE) cream Apply to affected area 2 times daily prn Patient not taking: Reported on 06/11/2017 12/17/16   Street, [place], PA-C  guaiFENesin (ROBITUSSIN) 100 MG/5ML liquid Take 5-10 mLs (100-200 mg total) by mouth every 4 (four) hours as needed for cough. Patient not taking: Reported on 04/08/2017 07/17/16   Mesner, Barbara Cower, MD  hydrocortisone cream 1 % Apply to affected area 2 times daily Patient not taking: Reported on 04/08/2017 06/08/16   Felicie Morn, NP  hydrOXYzine (ATARAX/VISTARIL) 25 MG tablet Take 1 tablet (25 mg total) by mouth every 6 (six) hours as needed. Patient not taking: Reported on 04/08/2017 07/17/16   Mesner, Barbara Cower, MD  ibuprofen (ADVIL,MOTRIN) 600 MG tablet Take 1 tablet (600 mg total) by mouth every 6 (six) hours as needed. Patient not taking: Reported on 09/15/2017 09/13/17   Derwood Kaplan, MD  penicillin v potassium (VEETID) 500 MG tablet Take 1 tablet (500 mg total) by mouth 4 (four) times daily for 7 days. Patient not taking: Reported on 09/15/2017 09/13/17 09/20/17  Derwood Kaplan, MD  potassium chloride (K-DUR) 10 MEQ tablet Take 2 tablets (20 mEq total) by mouth 2 (two) times daily for  5 days. 09/15/17 09/20/17  Rawan Riendeau, Hillard DankerShawn C, PA-C  predniSONE (DELTASONE) 20 MG tablet 2 tabs po daily x 4 days Patient not taking: Reported on 04/08/2017 07/18/16   Mesner, Barbara CowerJason, MD    Family History Family History  Problem Relation Age of Onset  . Hypertension Mother   . Cancer Mother   . Hypertension Father   . Hypertension Sister   . Hypertension Maternal Grandmother     Social History Social History   Tobacco Use  . Smoking status: Current Every Day Smoker    Packs/day: 0.25    Years: 7.00    Pack years: 1.75     Types: Cigarettes  . Smokeless tobacco: Never Used  Substance Use Topics  . Alcohol use: No  . Drug use: No     Allergies   Patient has no known allergies.   Review of Systems Review of Systems  Constitutional: Negative for chills, diaphoresis and fever.  HENT: Positive for dental problem. Negative for facial swelling, trouble swallowing and voice change.   Respiratory: Negative for shortness of breath.   Cardiovascular: Negative for chest pain.  Gastrointestinal: Negative for abdominal pain, nausea and vomiting.  Musculoskeletal: Negative for neck pain and neck stiffness.  Neurological: Positive for headaches. Negative for weakness and numbness.  All other systems reviewed and are negative.    Physical Exam Updated Vital Signs BP (!) 144/101 (BP Location: Left Arm)   Pulse 68   Temp 98.4 F (36.9 C) (Oral)   Resp 17   LMP 09/13/2017   SpO2 98%   Physical Exam  Constitutional: She is oriented to person, place, and time. She appears well-developed and well-nourished. No distress.  HENT:  Head: Normocephalic and atraumatic.  Erosion to right lower rearmost molar with tenderness. No facial swelling or tenderness.  No trismus.  Handles oral secretions without difficulty.  Mouth opening to at least 3 finger widths.  Eyes: Conjunctivae and EOM are normal. Pupils are equal, round, and reactive to light.  Neck: Normal range of motion. Neck supple.  Cardiovascular: Normal rate, regular rhythm, normal heart sounds and intact distal pulses.  Pulmonary/Chest: Effort normal and breath sounds normal. No respiratory distress.  Abdominal: Soft. There is no tenderness. There is no guarding.  Musculoskeletal: She exhibits no edema.  Normal motor function intact in all extremities and spine. No midline spinal tenderness.   Lymphadenopathy:    She has no cervical adenopathy.  Neurological: She is alert and oriented to person, place, and time.  No sensory deficits.  No noted speech  deficits. No aphasia. Patient handles oral secretions without difficulty. No noted swallowing defects.  Equal grip strength bilaterally. Strength 5/5 in the upper extremities. Strength 5/5 with flexion and extension of the hips, knees, and ankles bilaterally.  Negative Romberg. No gait disturbance.  Coordination intact including heel to shin and finger to nose.  Cranial nerves III-XII grossly intact.  No facial droop.   Skin: Skin is warm and dry. She is not diaphoretic.  Psychiatric: She has a normal mood and affect. Her behavior is normal.  Nursing note and vitals reviewed.    ED Treatments / Results  Labs (all labs ordered are listed, but only abnormal results are displayed) Labs Reviewed  CBC WITH DIFFERENTIAL/PLATELET - Abnormal; Notable for the following components:      Result Value   Lymphs Abs 4.3 (*)    All other components within normal limits  BASIC METABOLIC PANEL - Abnormal; Notable for the following components:  Potassium 3.1 (*)    All other components within normal limits  I-STAT BETA HCG BLOOD, ED ([facility], [facility], [facility] ONLY)    EKG  EKG Interpretation None       Radiology No results found.  Procedures Procedures (including critical care time)  Medications Ordered in ED Medications  ketorolac (TORADOL) 30 MG/ML injection 30 mg (30 mg Intravenous Given 09/15/17 0725)  sodium chloride 0.9 % bolus 1,000 mL (0 mLs Intravenous Stopped 09/15/17 0841)  metoCLOPramide (REGLAN) injection 10 mg (10 mg Intravenous Given 09/15/17 0725)  methylPREDNISolone sodium succinate (SOLU-MEDROL) 125 mg/2 mL injection 125 mg (125 mg Intravenous Given 09/15/17 0725)  potassium chloride SA (K-DUR,KLOR-CON) CR tablet 40 mEq (40 mEq Oral Given 09/15/17 0842)     Initial Impression / Assessment and Plan / ED Course  I have reviewed the triage vital signs and the nursing notes.  Pertinent labs & imaging results that were available during my care of the patient were reviewed by me and  considered in my medical decision making (see chart for details).  Clinical Course as of Sep 16 544  Wed Sep 15, 2017  0815 Hypokalemia noted and addressed.  Potassium: (!) 3.1 [SJ]  0831 Patient sleeping upon my reevaluation, but easily rousable.  States her headache has resolved.  Denies additional symptoms.  [SJ]    Clinical Course User Index [SJ] Shontez Sermon C, PA-C    Patient presents with headache.  No neuro deficits on exam.  No red flag symptoms.  Encouraged PCP follow-up.  Resources given and explicitly discussed.  Patient's dental pain was also readdressed.  Dental follow-up was again encouraged.  Resources were given and explicitly discussed.  The patient was given instructions for home care as well as return precautions. Patient voices understanding of these instructions, accepts the plan, and is comfortable with discharge.  Vitals:   09/15/17 0437 09/15/17 0722  BP: (!) 144/101 130/77  Pulse: 68 64  Resp: 17 16  Temp: 98.4 F (36.9 C)   TempSrc: Oral   SpO2: 98% 99%    Final Clinical Impressions(s) / ED Diagnoses   Final diagnoses:  Bad headache  Pain, dental  Hypokalemia    ED Discharge Orders        Ordered    potassium chloride (K-DUR) 10 MEQ tablet  2 times daily     09/15/17 0835       Anselm Pancoast, PA-C 09/16/17 0546    Molpus, Jonny Ruiz, MD 09/16/17 [id number]

## 2017-09-15 NOTE — ED Notes (Signed)
Pt complains of a headache for one week, pt was seen recently for an abscess tooth and was told to follow up with her dentist and now she has a headache

## 2018-03-07 ENCOUNTER — Other Ambulatory Visit: Payer: Self-pay

## 2018-03-07 ENCOUNTER — Encounter (HOSPITAL_COMMUNITY): Payer: Self-pay | Admitting: Emergency Medicine

## 2018-03-07 ENCOUNTER — Emergency Department (HOSPITAL_COMMUNITY)
Admission: EM | Admit: 2018-03-07 | Discharge: 2018-03-07 | Disposition: A | Discharge status: Home / Self Care | Payer: BLUE CROSS/BLUE SHIELD | Attending: Emergency Medicine | Admitting: Emergency Medicine

## 2018-03-07 DIAGNOSIS — J45909 Unspecified asthma, uncomplicated: Secondary | ICD-10-CM | POA: Diagnosis not present

## 2018-03-07 DIAGNOSIS — F17210 Nicotine dependence, cigarettes, uncomplicated: Secondary | ICD-10-CM | POA: Insufficient documentation

## 2018-03-07 DIAGNOSIS — R1011 Right upper quadrant pain: Secondary | ICD-10-CM | POA: Diagnosis not present

## 2018-03-07 DIAGNOSIS — N12 Tubulo-interstitial nephritis, not specified as acute or chronic: Secondary | ICD-10-CM | POA: Insufficient documentation

## 2018-03-07 LAB — CBC
HEMATOCRIT: 39.8 % (ref 36.0–46.0)
Hemoglobin: 12.9 g/dL (ref 12.0–15.0)
MCH: 27.9 pg (ref 26.0–34.0)
MCHC: 32.4 g/dL (ref 30.0–36.0)
MCV: 86 fL (ref 78.0–100.0)
Platelets: 247 10*3/uL (ref 150–400)
RBC: 4.63 MIL/uL (ref 3.87–5.11)
RDW: 13.4 % (ref 11.5–15.5)
WBC: 14.8 10*3/uL — ABNORMAL HIGH (ref 4.0–10.5)

## 2018-03-07 LAB — COMPREHENSIVE METABOLIC PANEL
ALT: 12 U/L (ref 0–44)
ANION GAP: 9 (ref 5–15)
AST: 15 U/L (ref 15–41)
Albumin: 3.9 g/dL (ref 3.5–5.0)
Alkaline Phosphatase: 49 U/L (ref 38–126)
BILIRUBIN TOTAL: 1 mg/dL (ref 0.3–1.2)
BUN: 9 mg/dL (ref 6–20)
CO2: 25 mmol/L (ref 22–32)
Calcium: 9.1 mg/dL (ref 8.9–10.3)
Chloride: 105 mmol/L (ref 98–111)
Creatinine, Ser: 0.94 mg/dL (ref 0.44–1.00)
GFR calc Af Amer: >60 mL/min (ref >60)
GFR calc non Af Amer: >60 mL/min (ref >60)
Glucose, Bld: 90 mg/dL (ref 70–99)
Potassium: 4.2 mmol/L (ref 3.5–5.1)
SODIUM: 139 mmol/L (ref 135–145)
TOTAL PROTEIN: 7.6 g/dL (ref 6.5–8.1)

## 2018-03-07 LAB — URINALYSIS, ROUTINE W REFLEX MICROSCOPIC
BILIRUBIN URINE: NEGATIVE
Glucose, UA: NEGATIVE mg/dL
KETONES UR: NEGATIVE mg/dL
Nitrite: POSITIVE — AB
Protein, ur: 100 mg/dL — AB
Specific Gravity, Urine: 1.009 (ref 1.005–1.030)
WBC, UA: >50 WBC/hpf — ABNORMAL HIGH (ref 0–5)
pH: 6 (ref 5.0–8.0)

## 2018-03-07 LAB — I-STAT BETA HCG BLOOD, ED (MC, WL, AP ONLY): I-stat hCG, quantitative: <5 m[IU]/mL (ref <5)

## 2018-03-07 LAB — LIPASE, BLOOD: Lipase: 35 U/L (ref 11–51)

## 2018-03-07 MED ORDER — SODIUM CHLORIDE 0.9 % IV SOLN
1.0000 g | INTRAVENOUS | Status: DC
  Administered 2018-03-07: 1 g via INTRAVENOUS
  Filled 2018-03-07: qty 10

## 2018-03-07 MED ORDER — HYDROCODONE-ACETAMINOPHEN 5-325 MG PO TABS: 2.0000 | ORAL_TABLET | ORAL | 0 refills | Status: DC | PRN

## 2018-03-07 MED ORDER — IBUPROFEN 400 MG PO TABS
600.0000 mg | ORAL_TABLET | Freq: Once | ORAL | Status: AC
  Administered 2018-03-07: 600 mg via ORAL
  Filled 2018-03-07: qty 1

## 2018-03-07 MED ORDER — ONDANSETRON HCL 4 MG/2ML IJ SOLN
4.0000 mg | Freq: Once | INTRAMUSCULAR | Status: AC
  Administered 2018-03-07: 4 mg via INTRAVENOUS
  Filled 2018-03-07: qty 2

## 2018-03-07 MED ORDER — LIDOCAINE HCL (PF) 1 % IJ SOLN
INTRAMUSCULAR | Status: AC
  Filled 2018-03-07: qty 5

## 2018-03-07 MED ORDER — OXYCODONE-ACETAMINOPHEN 5-325 MG PO TABS
1.0000 | ORAL_TABLET | Freq: Once | ORAL | Status: AC
  Administered 2018-03-07: 1 via ORAL
  Filled 2018-03-07: qty 1

## 2018-03-07 MED ORDER — CEFTRIAXONE SODIUM 1 G IJ SOLR
1.0000 g | Freq: Once | INTRAMUSCULAR | Status: DC
  Filled 2018-03-07: qty 10

## 2018-03-07 MED ORDER — SODIUM CHLORIDE 0.9 % IV BOLUS
1000.0000 mL | Freq: Once | INTRAVENOUS | Status: AC
  Administered 2018-03-07: 1000 mL via INTRAVENOUS

## 2018-03-07 MED ORDER — SULFAMETHOXAZOLE-TRIMETHOPRIM 800-160 MG PO TABS: 1.0000 | ORAL_TABLET | Freq: Two times a day (BID) | ORAL | 0 refills | Status: AC

## 2018-03-07 NOTE — ED Provider Notes (Addendum)
[facility] EMERGENC[facility] Provider Note   CSN: [id number] Arrival date & time: 03/07/18  1513     History   Chief Complaint Chief Complaint  Patient presents with  . Abdominal Pain    HPI Frances Murphy is a 27 y.o. female.  HPI    27 year old female presents today with complaints of right flank pain and urinary symptoms. Patient notes that over the last 2 days she's had increased urination, burning with urination and malodorous urine. Patient notes today she developed pain in her right mid abdomen and flank, she denies any nausea or vomiting, denies any fever at home. Patient has no history of urinary tract infections.   Past Medical History:  Diagnosis Date  . Asthma     Patient Active Problem List   Diagnosis Date Noted  . Preterm delivery, delivered 04/13/2013  . 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 Asthma 11/02/2012  . Obesity 11/02/2012  . Family history of breast cancer in mother 11/02/2012  . Current smoker 11/02/2012    Past Surgical History:  Procedure Laterality Date  . None       OB History    Gravida  1   Para  1   Term  0   Preterm  1   AB  0   Living  0     SAB  0   TAB  0   Ectopic  0   Multiple  0   Live Births  1            Home Medications    Prior to Admission medications   Medication Sig Start Date End Date Taking? Authorizing Provider  albuterol (PROVENTIL HFA;VENTOLIN HFA) 108 (90 Base) MCG/ACT inhaler Inhale 2 puffs into the lungs every 4 (four) hours as needed for shortness of breath. Patient not taking: Reported on 06/11/2017 07/17/16   Mesner, Barbara CowerJason, MD  aspirin-acetaminophen-caffeine (EXCEDRIN MIGRAINE) [phone] MG tablet Take 1 tablet every 6 (six) hours as needed by mouth. 06/11/17   Ward, Chase PicketJaime Pilcher, PA-C  clotrimazole-betamethasone (LOTRISONE) cream Apply to affected area 2 times daily prn Patient not taking: Reported on 06/11/2017 12/17/16   Street, RyanMercedes, PA-C    guaiFENesin (ROBITUSSIN) 100 MG/5ML liquid Take 5-10 mLs (100-200 mg total) by mouth every 4 (four) hours as needed for cough. Patient not taking: Reported on 04/08/2017 07/17/16   Mesner, Barbara CowerJason, MD  HYDROcodone-acetaminophen (NORCO/VICODIN) 5-325 MG tablet Take 2 tablets by mouth every 4 (four) hours as needed. 03/07/18   Sanaiyah Kirchhoff, Tinnie GensJeffrey, PA-C  hydrocortisone cream 1 % Apply to affected area 2 times daily Patient not taking: Reported on 04/08/2017 06/08/16   Felicie MornSmith, David, NP  hydrOXYzine (ATARAX/VISTARIL) 25 MG tablet Take 1 tablet (25 mg total) by mouth every 6 (six) hours as needed. Patient not taking: Reported on 04/08/2017 07/17/16   Mesner, Barbara CowerJason, MD  ibuprofen (ADVIL,MOTRIN) 600 MG tablet Take 1 tablet (600 mg total) by mouth every 6 (six) hours as needed. Patient not taking: Reported on 09/15/2017 09/13/17   Derwood KaplanNanavati, Ankit, MD  potassium chloride (K-DUR) 10 MEQ tablet Take 2 tablets (20 mEq total) by mouth 2 (two) times daily for 5 days. 09/15/17 09/20/17  Joy, Hillard DankerShawn C, PA-C  predniSONE (DELTASONE) 20 MG tablet 2 tabs po daily x 4 days Patient not taking: Reported on 04/08/2017 07/18/16   Mesner, Barbara CowerJason, MD  sulfamethoxazole-trimethoprim (BACTRIM DS,SEPTRA DS) 800-160 MG tablet Take 1 tablet by mouth 2 (two) times daily for 10 days. 03/07/18 03/17/18  Vasily Fedewa,  Tinnie Gens, PA-C    Family History Family History  Problem Relation Age of Onset  . Hypertension Mother   . Cancer Mother   . Hypertension Father   . Hypertension Sister   . Hypertension Maternal Grandmother     Social History Social History   Tobacco Use  . Smoking status: Current Every Day Smoker    Packs/day: 0.25    Years: 7.00    Pack years: 1.75    Types: Cigarettes  . Smokeless tobacco: Never Used  Substance Use Topics  . Alcohol use: No  . Drug use: No     Allergies   Patient has no known allergies.  Review of Systems Review of Systems  All other systems reviewed and are negative.  Physical Exam Updated Vital  Signs BP 123/71 (BP Location: Left Arm)   Pulse (!) 107   Temp (!) 101.1 F (38.4 C) (Oral)   Resp 18   LMP 03/04/2018   SpO2 100%   Physical Exam  Constitutional: She is oriented to person, place, and time. She appears well-developed and well-nourished.  HENT:  Head: Normocephalic and atraumatic.  Eyes: Pupils are equal, round, and reactive to light. Conjunctivae are normal. Right eye exhibits no discharge. Left eye exhibits no discharge. No scleral icterus.  Neck: Normal range of motion. No JVD present. No tracheal deviation present.  Pulmonary/Chest: Effort normal. No stridor.  Abdominal: Soft.  Minor tenderness to right mid abdomen, minor right-sided CVA tenderness, no significant right lower quadrant, suprapubic or left sided abdominal tenderness  Neurological: She is alert and oriented to person, place, and time. Coordination normal.  Psychiatric: She has a normal mood and affect. Her behavior is normal. Judgment and thought content normal.  Nursing note and vitals reviewed.   ED Treatments / Results  Labs (all labs ordered are listed, but only abnormal results are displayed) Labs Reviewed  CBC - Abnormal; Notable for the following components:      Result Value   WBC 14.8 (*)    All other components within normal limits  URINALYSIS, ROUTINE W REFLEX MICROSCOPIC - Abnormal; Notable for the following components:   APPearance TURBID (*)    Hgb urine dipstick LARGE (*)    Protein, ur 100 (*)    Nitrite POSITIVE (*)    Leukocytes, UA LARGE (*)    WBC, UA >50 (*)    Bacteria, UA MANY (*)    All other components within normal limits  URINE CULTURE  LIPASE, BLOOD  COMPREHENSIVE METABOLIC PANEL  I-STAT BETA HCG BLOOD, ED ([facility], [facility], [facility] ONLY)    EKG None  Radiology No results found.  Procedures Procedures (including critical care time)  Medications Ordered in ED Medications  cefTRIAXone (ROCEPHIN) 1 g in sodium chloride 0.9 % 100 mL IVPB (0 g Intravenous Stopped  03/07/18 2058)  sodium chloride 0.9 % bolus 1,000 mL (0 mLs Intravenous Stopped 03/07/18 2129)  oxyCODONE-acetaminophen (PERCOCET/ROXICET) 5-325 MG per tablet 1 tablet (1 tablet Oral Given 03/07/18 2022)  ibuprofen (ADVIL,MOTRIN) tablet 600 mg (600 mg Oral Given 03/07/18 2059)  ondansetron (ZOFRAN) injection 4 mg (4 mg Intravenous Given 03/07/18 2111)     Initial Impression / Assessment and Plan / ED Course  I have reviewed the triage vital signs and the nursing notes.  Pertinent labs & imaging results that were available during my care of the patient were reviewed by me and considered in my medical decision making (see chart for details).      Labs: UA, I  stat beta HCg, Lipase, CMP, CBC,   Imaging:  Consults:  Therapeutics: ceftriaxone, NS, ibuprofen, Percocet  Discharge Meds: Bactrim  Assessment/Plan: 27 year old female presents today with likely pyelonephritis. Patient febrile here but well appearing in no acute distress. She is otherwise healthy. She does have slight elevation in white count of 14.8. Patient with minimal abdominal tenderness in the mid right abdomen, no significant right lower quadrant abdominal tenderness, positive CVA tenderness. My suspicion is high for pyelonephritis given her presentation along with urinalysis. I have low suspicion for acute intra-abdominal pathology including appendicitis diverticulitis. Patient will be given a dose of antibiotics here, she was discharged with oral antibiotics with close follow-up. Patient will return if she remains febrile beyond 36 hours, she'll return immediately if she develops any new or worsening signs or symptoms. Patient a longer tachycardic with improved fever at 99. Patient verbalized understanding and agreement to today's plan had no further questions concerns at the time discharge.  Final Clinical Impressions(s) / ED Diagnoses   Final diagnoses:  Pyelonephritis    ED Discharge Orders        Ordered     sulfamethoxazole-trimethoprim (BACTRIM DS,SEPTRA DS) 800-160 MG tablet  2 times daily     03/07/18 2156    HYDROcodone-acetaminophen (NORCO/VICODIN) 5-325 MG tablet  Every 4 hours PRN     03/07/18 2156           Eyvonne Mechanic, PA-C 03/07/18 2204    Sabas Sous, MD 03/07/18 2300

## 2018-03-07 NOTE — ED Provider Notes (Signed)
Patient placed in Quick Look pathway, seen and evaluated   Chief Complaint: abdominal pain  HPI:   Frances SchwalbeKielle Murphy is a 27 y.o. female who present to the ED with abdominal pain that started this morning. The pain is located on the right upper and lower abdomen. Patient rates her pain as 10/10. Patient reports nausea. Patient currently having menses. Chills but no fever  ROS: GI: abdominal pain, nausea  GU: vaginal bleeding  Physical Exam:  BP 131/82 (BP Location: Right Arm)   Pulse 100   Temp 98.4 F (36.9 C) (Oral)   Resp 18   LMP 03/04/2018   SpO2 100%    Gen: No distress  Neuro: Awake and Alert  Skin: Warm and dry  Abdomen: soft, tender with palpation right upper and right lower quadrant. No rebound or guarding.     Initiation of care has begun. The patient has been counseled on the process, plan, and necessity for staying for the completion/evaluation, and the remainder of the medical screening examination    Janne Napoleoneese, Hope M, NP 03/10/18 1419    Derwood KaplanNanavati, Ankit, MD 03/13/18 1524

## 2018-03-07 NOTE — ED Notes (Signed)
Pt verbalizes understanding of d/c instructions. Pt received prescriptions. Pt ambulatory at d/c with all belongings and with family.   

## 2018-03-07 NOTE — ED Notes (Signed)
ED Provider at bedside. 

## 2018-03-07 NOTE — ED Triage Notes (Signed)
Pt c/o RUQ pain that started today. Endorses nausea, denies vomiting/diarrhea. Also c/o frequent urination.

## 2018-03-07 NOTE — Discharge Instructions (Addendum)
Please read attached information. If you experience any new or worsening signs or symptoms please return to the emergency room for evaluation. Please follow-up with your primary care provider or specialist as discussed. Please use medication prescribed only as directed and discontinue taking if you have any concerning signs or symptoms.   °

## 2018-03-08 ENCOUNTER — Telehealth: Payer: Self-pay | Admitting: *Deleted

## 2018-03-08 NOTE — Telephone Encounter (Signed)
Pharmacy called related to Rx: [place] frequency .Marland Kitchen.Marland Kitchen.EDCM clarified with EDP (Hedges) to change Rx to: every 6 hours.

## 2018-03-10 ENCOUNTER — Encounter: Payer: Self-pay | Admitting: Certified Nurse Midwife

## 2018-03-10 ENCOUNTER — Other Ambulatory Visit (HOSPITAL_COMMUNITY)
Admission: RE | Admit: 2018-03-10 | Discharge: 2018-03-10 | Disposition: A | Discharge status: Home / Self Care | Payer: BLUE CROSS/BLUE SHIELD | Source: Ambulatory Visit | Attending: Obstetrics & Gynecology | Admitting: Obstetrics & Gynecology

## 2018-03-10 ENCOUNTER — Other Ambulatory Visit: Payer: Self-pay

## 2018-03-10 ENCOUNTER — Ambulatory Visit (INDEPENDENT_AMBULATORY_CARE_PROVIDER_SITE_OTHER): Payer: BLUE CROSS/BLUE SHIELD | Admitting: Certified Nurse Midwife

## 2018-03-10 VITALS — BP 120/82 | HR 74 | Ht 67.25 in | Wt 245.0 lb

## 2018-03-10 DIAGNOSIS — Z01419 Encounter for gynecological examination (general) (routine) without abnormal findings: Secondary | ICD-10-CM | POA: Diagnosis not present

## 2018-03-10 DIAGNOSIS — N390 Urinary tract infection, site not specified: Secondary | ICD-10-CM

## 2018-03-10 DIAGNOSIS — Z803 Family history of malignant neoplasm of breast: Secondary | ICD-10-CM

## 2018-03-10 DIAGNOSIS — Z124 Encounter for screening for malignant neoplasm of cervix: Secondary | ICD-10-CM

## 2018-03-10 DIAGNOSIS — Z113 Encounter for screening for infections with a predominantly sexual mode of transmission: Secondary | ICD-10-CM | POA: Diagnosis not present

## 2018-03-10 LAB — URINE CULTURE: Culture: >=100000 — AB

## 2018-03-10 NOTE — Patient Instructions (Signed)
General topics  Next pap or exam is  due in 1 year Take a Women's multivitamin Take 1200 mg. of calcium daily - prefer dietary If any concerns in interim to call back  Breast Self-Awareness Practicing breast self-awareness may pick up problems early, prevent significant medical complications, and possibly save your life. By practicing breast self-awareness, you can become familiar with how your breasts look and feel and if your breasts are changing. This allows you to notice changes early. It can also offer you some reassurance that your breast health is good. One way to learn what is normal for your breasts and whether your breasts are changing is to do a breast self-exam. If you find a lump or something that was not present in the past, it is best to contact your caregiver right away. Other findings that should be evaluated by your caregiver include nipple discharge, especially if it is bloody; skin changes or reddening; areas where the skin seems to be pulled in (retracted); or new lumps and bumps. Breast pain is seldom associated with cancer (malignancy), but should also be evaluated by a caregiver. BREAST SELF-EXAM The best time to examine your breasts is 5 7 days after your menstrual period is over.  ExitCare Patient Information 2013 [employer].   Exercise to Stay Healthy Exercise helps you become and stay healthy. EXERCISE IDEAS AND TIPS Choose exercises that:  You enjoy.  Fit into your day. You do not need to exercise really hard to be healthy. You can do exercises at a slow or medium level and stay healthy. You can:  Stretch before and after working out.  Try yoga, Pilates, or tai chi.  Lift weights.  Walk fast, swim, jog, run, climb stairs, bicycle, dance, or rollerskate.  Take aerobic classes. Exercises that burn about 150 calories:  Running 1  miles in 15 minutes.  Playing volleyball for 45 to 60 minutes.  Washing and waxing a car for 45 to 60  minutes.  Playing touch football for 45 minutes.  Walking 1  miles in 35 minutes.  Pushing a stroller 1  miles in 30 minutes.  Playing basketball for 30 minutes.  Raking leaves for 30 minutes.  Bicycling 5 miles in 30 minutes.  Walking 2 miles in 30 minutes.  Dancing for 30 minutes.  Shoveling snow for 15 minutes.  Swimming laps for 20 minutes.  Walking up stairs for 15 minutes.  Bicycling 4 miles in 15 minutes.  Gardening for 30 to 45 minutes.  Jumping rope for 15 minutes.  Washing windows or floors for 45 to 60 minutes. Document Released: 08/22/2010 Document Revised: 10/12/2011 Document Reviewed: 08/22/2010 ExitCare Patient Information 2013 [employer].   Other topics ( that may be useful information):    Sexually Transmitted Disease Sexually transmitted disease (STD) refers to any infection that is passed from person to person during sexual activity. This may happen by way of saliva, semen, blood, vaginal mucus, or urine. Common STDs include:  Gonorrhea.  Chlamydia.  Syphilis.  HIV/AIDS.  Genital herpes.  Hepatitis B and C.  Trichomonas.  Human papillomavirus (HPV).  Pubic lice. CAUSES  An STD may be spread by bacteria, virus, or parasite. A person can get an STD by:  Sexual intercourse with an infected person.  Sharing sex toys with an infected person.  Sharing needles with an infected person.  Having intimate contact with the genitals, mouth, or rectal areas of an infected person. SYMPTOMS  Some people may not have any symptoms, but   they can still pass the infection to others. Different STDs have different symptoms. Symptoms include:  Painful or bloody urination.  Pain in the pelvis, abdomen, vagina, anus, throat, or eyes.  Skin rash, itching, irritation, growths, or sores (lesions). These usually occur in the genital or anal area.  Abnormal vaginal discharge.  Penile discharge in men.  Soft, flesh-colored skin growths in the  genital or anal area.  Fever.  Pain or bleeding during sexual intercourse.  Swollen glands in the groin area.  Yellow skin and eyes (jaundice). This is seen with hepatitis. DIAGNOSIS  To make a diagnosis, your caregiver may:  Take a medical history.  Perform a physical exam.  Take a specimen (culture) to be examined.  Examine a sample of discharge under a microscope.  Perform blood test TREATMENT   Chlamydia, gonorrhea, trichomonas, and syphilis can be cured with antibiotic medicine.  Genital herpes, hepatitis, and HIV can be treated, but not cured, with prescribed medicines. The medicines will lessen the symptoms.  Genital warts from HPV can be treated with medicine or by freezing, burning (electrocautery), or surgery. Warts may come back.  HPV is a virus and cannot be cured with medicine or surgery.However, abnormal areas may be followed very closely by your caregiver and may be removed from the cervix, vagina, or vulva through office procedures or surgery. If your diagnosis is confirmed, your recent sexual partners need treatment. This is true even if they are symptom-free or have a negative culture or evaluation. They should not have sex until their caregiver says it is okay. HOME CARE INSTRUCTIONS  All sexual partners should be informed, tested, and treated for all STDs.  Take your antibiotics as directed. Finish them even if you start to feel better.  Only take over-the-counter or prescription medicines for pain, discomfort, or fever as directed by your caregiver.  Rest.  Eat a balanced diet and drink enough fluids to keep your urine clear or pale yellow.  Do not have sex until treatment is completed and you have followed up with your caregiver. STDs should be checked after treatment.  Keep all follow-up appointments, Pap tests, and blood tests as directed by your caregiver.  Only use latex condoms and water-soluble lubricants during sexual activity. Do not use  petroleum jelly or oils.  Avoid alcohol and illegal drugs.  Get vaccinated for HPV and hepatitis. If you have not received these vaccines in the past, talk to your caregiver about whether one or both might be right for you.  Avoid risky sex practices that can break the skin. The only way to avoid getting an STD is to avoid all sexual activity.Latex condoms and dental dams (for oral sex) will help lessen the risk of getting an STD, but will not completely eliminate the risk. SEEK MEDICAL CARE IF:   You have a fever.  You have any new or worsening symptoms. Document Released: 10/10/2002 Document Revised: 10/12/2011 Document Reviewed: 10/17/2010 [facility] Patient Information 2013 [employer].    Domestic Abuse You are being battered or abused if someone close to you hits, pushes, or physically hurts you in any way. You also are being abused if you are forced into activities. You are being sexually abused if you are forced to have sexual contact of any kind. You are being emotionally abused if you are made to feel worthless or if you are constantly threatened. It is important to remember that help is available. No one has the right to abuse you. PREVENTION OF FURTHER  ABUSE  Learn the warning signs of danger. This varies with situations but may include: the use of alcohol, threats, isolation from friends and family, or forced sexual contact. Leave if you feel that violence is going to occur.  If you are attacked or beaten, report it to the police so the abuse is documented. You do not have to press charges. The police can protect you while you or the attackers are leaving. Get the officer's name and badge number and a copy of the report.  Find someone you can trust and tell them what is happening to you: your caregiver, a nurse, clergy member, close friend or family member. Feeling ashamed is natural, but remember that you have done nothing wrong. No one deserves abuse. Document Released:  07/17/2000 Document Revised: 10/12/2011 Document Reviewed: 09/25/2010 ExitCare Patient Information 2013 [employer].    How Much is Too Much Alcohol? Drinking too much alcohol can cause injury, accidents, and health problems. These types of problems can include:   Car crashes.  Falls.  Family fighting (domestic violence).  Drowning.  Fights.  Injuries.  Burns.  Damage to certain organs.  Having a baby with birth defects. ONE DRINK CAN BE TOO MUCH WHEN YOU ARE:  Working.  Pregnant or breastfeeding.  Taking medicines. Ask your doctor.  Driving or planning to drive. If you or someone you know has a drinking problem, get help from a doctor.  Document Released: 05/16/2009 Document Revised: 10/12/2011 Document Reviewed: 05/16/2009 ExitCare Patient Information 2013 [employer].   Smoking Hazards Smoking cigarettes is extremely bad for your health. Tobacco smoke has over 200 known poisons in it. There are over 60 chemicals in tobacco smoke that cause cancer. Some of the chemicals found in cigarette smoke include:   Cyanide.  Benzene.  Formaldehyde.  Methanol (wood alcohol).  Acetylene (fuel used in welding torches).  Ammonia. Cigarette smoke also contains the poisonous gases nitrogen oxide and carbon monoxide.  Cigarette smokers have an increased risk of many serious medical problems and Smoking causes approximately:  90% of all lung cancer deaths in men.  80% of all lung cancer deaths in women.  90% of deaths from chronic obstructive lung disease. Compared with nonsmokers, smoking increases the risk of:  Coronary heart disease by 2 to 4 times.  Stroke by 2 to 4 times.  Men developing lung cancer by 23 times.  Women developing lung cancer by 13 times.  Dying from chronic obstructive lung diseases by 12 times.  . Smoking is the most preventable cause of death and disease in our society.  WHY IS SMOKING ADDICTIVE?  Nicotine is the chemical  agent in tobacco that is capable of causing addiction or dependence.  When you smoke and inhale, nicotine is absorbed rapidly into the bloodstream through your lungs. Nicotine absorbed through the lungs is capable of creating a powerful addiction. Both inhaled and non-inhaled nicotine may be addictive.  Addiction studies of cigarettes and spit tobacco show that addiction to nicotine occurs mainly during the teen years, when young people begin using tobacco products. WHAT ARE THE BENEFITS OF QUITTING?  There are many health benefits to quitting smoking.   Likelihood of developing cancer and heart disease decreases. Health improvements are seen almost immediately.  Blood pressure, pulse rate, and breathing patterns start returning to normal soon after quitting. QUITTING SMOKING   American Lung Association - 1-800-LUNGUSA  American Cancer Society - 1-800-ACS-2345 Document Released: 08/27/2004 Document Revised: 10/12/2011 Document Reviewed: 05/01/2009 ExitCare Patient Information 2013 ExitCare,   LLC.   Stress Management Stress is a state of physical or mental tension that often results from changes in your life or normal routine. Some common causes of stress are:  Death of a loved one.  Injuries or severe illnesses.  Getting fired or changing jobs.  Moving into a new home. Other causes may be:  Sexual problems.  Business or financial losses.  Taking on a large debt.  Regular conflict with someone at home or at work.  Constant tiredness from lack of sleep. It is not just bad things that are stressful. It may be stressful to:  Win the lottery.  Get married.  Buy a new car. The amount of stress that can be easily tolerated varies from person to person. Changes generally cause stress, regardless of the types of change. Too much stress can affect your health. It may lead to physical or emotional problems. Too little stress (boredom) may also become stressful. SUGGESTIONS TO  REDUCE STRESS:  Talk things over with your family and friends. It often is helpful to share your concerns and worries. If you feel your problem is serious, you may want to get help from a professional counselor.  Consider your problems one at a time instead of lumping them all together. Trying to take care of everything at once may seem impossible. List all the things you need to do and then start with the most important one. Set a goal to accomplish 2 or 3 things each day. If you expect to do too many in a single day you will naturally fail, causing you to feel even more stressed.  Do not use alcohol or drugs to relieve stress. Although you may feel better for a short time, they do not remove the problems that caused the stress. They can also be habit forming.  Exercise regularly - at least 3 times per week. Physical exercise can help to relieve that "uptight" feeling and will relax you.  The shortest distance between despair and hope is often a good night's sleep.  Go to bed and get up on time allowing yourself time for appointments without being rushed.  Take a short "time-out" period from any stressful situation that occurs during the day. Close your eyes and take some deep breaths. Starting with the muscles in your face, tense them, hold it for a few seconds, then relax. Repeat this with the muscles in your neck, shoulders, hand, stomach, back and legs.  Take good care of yourself. Eat a balanced diet and get plenty of rest.  Schedule time for having fun. Take a break from your daily routine to relax. HOME CARE INSTRUCTIONS   Call if you feel overwhelmed by your problems and feel you can no longer manage them on your own.  Return immediately if you feel like hurting yourself or someone else. Document Released: 01/13/2001 Document Revised: 10/12/2011 Document Reviewed: 09/05/2007 ExitCare Patient Information 2013 ExitCare, LLC.   

## 2018-03-10 NOTE — Progress Notes (Signed)
27 y.o. G1P0100 Single  African American Fe here to establish gyn care and  for annual exam. Periods are 21 day cycles with 7 day duration with heavy days 1-4, cramping with OTC use which helps. Contraception condoms consistent use. Has had partner change and desires STD screening. Treated for Chlamydia in past and no follow up screening done.. Was seen for UTI on 03/07/18 at [facility] ER. Feeling better but still on Bactrim medication.Patient has asthma and establishing with PCP for management, soon. Migraines with aura and uses OTC medication with good results. No concerns. No other health issues today.  Patient's last menstrual period was 03/06/2018.          Sexually active: Yes.    The current method of family planning is condoms use.   Exercising: No.  exercise Smoker:  no  Review of Systems  Constitutional: Negative.   HENT: Negative.   Eyes: Negative.   Respiratory: Negative.   Cardiovascular: Negative.   Gastrointestinal: Negative.   Genitourinary: Negative.   Musculoskeletal: Negative.   Skin: Negative.   Neurological: Negative.   Endo/Heme/Allergies: Negative.   Psychiatric/Behavioral: Negative.     Health Maintenance: Pap:  July 2017 neg per patient History of Abnormal Pap: no MMG:  none Self Breast exams: yes Colonoscopy:  none BMD:   none TDaP:  Within 50yrs Shingles: no Pneumonia: no Hep C and HIV: HIV neg 2014 Labs: yes   reports that she has been smoking cigarettes. She has smoked for the past 7.00 years. She has never used smokeless tobacco. She reports that she does not drink alcohol or use drugs.  Past Medical History:  Diagnosis Date  . Asthma   . Migraines    with aura  . STD (sexually transmitted disease)    chlamydia    Past Surgical History:  Procedure Laterality Date  . None      Current Outpatient Medications  Medication Sig Dispense Refill  . albuterol (PROVENTIL HFA;VENTOLIN HFA) 108 (90 Base) MCG/ACT inhaler Inhale 2 puffs into the lungs  every 4 (four) hours as needed for shortness of breath. 1 Inhaler 0  . aspirin-acetaminophen-caffeine (EXCEDRIN MIGRAINE) 250-250-65 MG tablet Take 1 tablet every 6 (six) hours as needed by mouth. 30 tablet 0  . HYDROcodone-acetaminophen (NORCO/VICODIN) 5-325 MG tablet Take 2 tablets by mouth every 4 (four) hours as needed. 6 tablet 0  . ibuprofen (ADVIL,MOTRIN) 600 MG tablet Take 1 tablet (600 mg total) by mouth every 6 (six) hours as needed. 30 tablet 0  . sulfamethoxazole-trimethoprim (BACTRIM DS,SEPTRA DS) 800-160 MG tablet Take 1 tablet by mouth 2 (two) times daily for 10 days. 20 tablet 0   No current facility-administered medications for this visit.     Family History  Problem Relation Age of Onset  . Hypertension Mother   . Breast cancer Mother   . Hypertension Father   . Hypertension Sister   . Hypertension Maternal Grandmother   . Cancer Paternal Grandmother   . Cancer Paternal Grandfather     ROS:  Pertinent items are noted in HPI.  Otherwise, a comprehensive ROS was negative.  Exam:   Ht 5' 7.25" (1.708 m)   Wt 245 lb (111.1 kg)   LMP 03/06/2018   BMI 38.09 kg/m  Height: 5' 7.25" (170.8 cm) Ht Readings from Last 3 Encounters:  03/10/18 5' 7.25" (1.708 m)  08/30/17 5\' 7"  (1.702 m)  04/06/17 5\' 7"  (1.702 m)    General appearance: alert, cooperative and appears stated age Head: Normocephalic, without  obvious abnormality, atraumatic Neck: no adenopathy, supple, symmetrical, trachea midline and thyroid normal to inspection and palpation Lungs: clear to auscultation bilaterally Breasts: normal appearance, no masses or tenderness, No nipple retraction or dimpling, No nipple discharge or bleeding, No axillary or supraclavicular adenopathy Heart: regular rate and rhythm Abdomen: soft, non-tender; no masses,  no organomegaly Extremities: extremities normal, atraumatic, no cyanosis or edema Skin: Skin color, texture, turgor normal. No rashes or lesions Lymph nodes:  Cervical, supraclavicular, and axillary nodes normal. No abnormal inguinal nodes palpated Neurologic: Grossly normal   Pelvic: External genitalia:  no lesions              Urethra:  normal appearing urethra with no masses, tenderness or lesions              Bartholin's and Skene's: normal                 Vagina: normal appearing vagina with normal color and discharge, no lesions              Cervix: no bleeding following Pap, no cervical motion tenderness, no lesions and normal appearance              Pap taken: Yes.   Bimanual Exam:  Uterus:  normal size, contour, position, consistency, mobility, non-tender and anteverted              Adnexa: normal adnexa and no mass, fullness, tenderness               Rectovaginal: Confirms               Anus:  Normal appearance  Chaperone present: yes  A:  Well Woman with normal exam  Contraception condoms  Past history of Chlamydia, treated, but no follow up screen  UTI on Bactrim from ER visit, improving  STD screening  Asthma history establishing with PCP for management  Family history of breast cancer mother age 28  P:   Reviewed health and wellness pertinent to exam  Discussed importance of consistent use for best results with protection.  Continue Bactrim until completed( E.Coli on culture, per epic) warning signs of UTI given  Lab: STD panel, Hep C, Gc/Chlamydia, Affirm  Stressed importance of SBE and [facility]. Patient agreeable.  Pap smear: yes   counseled on breast self exam, STD prevention, HIV risk factors and prevention, feminine hygiene, adequate intake of calcium and vitamin D, diet and exercise  return annually or prn  An After Visit Summary was printed and given to the patient.

## 2018-03-11 ENCOUNTER — Telehealth: Payer: Self-pay

## 2018-03-11 ENCOUNTER — Other Ambulatory Visit: Payer: Self-pay

## 2018-03-11 LAB — CYTOLOGY - PAP: DIAGNOSIS: NEGATIVE

## 2018-03-11 LAB — HEP, RPR, HIV PANEL
HIV SCREEN 4TH GENERATION: NONREACTIVE
Hepatitis B Surface Ag: NEGATIVE
RPR: NONREACTIVE

## 2018-03-11 LAB — VAGINITIS/VAGINOSIS, DNA PROBE
CANDIDA SPECIES: NEGATIVE
GARDNERELLA VAGINALIS: POSITIVE — AB
Trichomonas vaginosis: NEGATIVE

## 2018-03-11 LAB — HEPATITIS C ANTIBODY: Hep C Virus Ab: <0.1 s/co ratio (ref 0.0–0.9)

## 2018-03-11 MED ORDER — METRONIDAZOLE 0.75 % VA GEL: VAGINAL | 0 refills | Status: DC

## 2018-03-11 NOTE — Telephone Encounter (Signed)
Post ED Visit - Positive Culture Follow-up  Culture report reviewed by antimicrobial stewardship pharmacist:  []  Frances Murphy, Pharm.D. []  Frances Murphy, Pharm.D., BCPS AQ-ID []  Frances Murphy, Pharm.D., BCPS []  Frances Murphy, Pharm.D., BCPS []  Frances Murphy, [place].D., BCPS, AAHIVP []  Frances Murphy, Pharm.D., BCPS, AAHIVP []  Frances Murphy, PharmD, BCPS []  Frances Murphy, PharmD, BCPS []  Frances Murphy, PharmD, BCPS []  Frances Murphy, PharmD Frances Murphy Pharm D Positive urine culture Treated with Bactrim DS, organism sensitive to the same and no further patient follow-up is required at this time.  Frances Murphy, Frances Murphy 03/11/2018, 10:21 AM

## 2018-03-13 LAB — GC/CHLAMYDIA PROBE AMP
CHLAMYDIA, DNA PROBE: NEGATIVE
Neisseria gonorrhoeae by PCR: NEGATIVE

## 2018-03-21 ENCOUNTER — Telehealth: Payer: Self-pay | Admitting: Certified Nurse Midwife

## 2018-03-21 NOTE — Telephone Encounter (Signed)
Patient was seen 03/10/18 and will not be able to pick up her prescription until the end of this week. Patient is asking for the prescription to be called to her phramcy again due to it being restocked at the pharmacy.

## 2018-03-22 NOTE — Telephone Encounter (Signed)
Spoke with patient regarding metrogel refill.  She says that the pharmacy said the order expired.  I called [facility] and updated the rx and patient will pick up later this week.

## 2018-03-30 ENCOUNTER — Encounter: Payer: Self-pay | Admitting: Family Medicine

## 2018-04-05 ENCOUNTER — Ambulatory Visit (INDEPENDENT_AMBULATORY_CARE_PROVIDER_SITE_OTHER): Payer: BLUE CROSS/BLUE SHIELD | Admitting: Certified Nurse Midwife

## 2018-04-05 ENCOUNTER — Other Ambulatory Visit: Payer: Self-pay

## 2018-04-05 ENCOUNTER — Encounter: Payer: Self-pay | Admitting: Certified Nurse Midwife

## 2018-04-05 VITALS — BP 124/84 | HR 70 | Resp 16 | Ht 67.25 in | Wt 242.0 lb

## 2018-04-05 DIAGNOSIS — B9689 Other specified bacterial agents as the cause of diseases classified elsewhere: Secondary | ICD-10-CM | POA: Diagnosis not present

## 2018-04-05 DIAGNOSIS — N760 Acute vaginitis: Secondary | ICD-10-CM

## 2018-04-05 DIAGNOSIS — Z30013 Encounter for initial prescription of injectable contraceptive: Secondary | ICD-10-CM

## 2018-04-05 MED ORDER — METRONIDAZOLE 500 MG PO TABS: 500.0000 mg | ORAL_TABLET | Freq: Two times a day (BID) | ORAL | 0 refills | Status: DC

## 2018-04-05 MED ORDER — MEDROXYPROGESTERONE ACETATE 150 MG/ML IM SUSP
150.0000 mg | Freq: Once | INTRAMUSCULAR | Status: AC
  Administered 2018-04-05: 150 mg via INTRAMUSCULAR

## 2018-04-05 NOTE — Progress Notes (Signed)
27 y.o. Single african [place] female presents to discuss need for contraception.  Previous methods used include Depo-Provera,POP and OCP. Patient has been thinking about contraception and feels Depo Provera is good choice again. She had weight gain with previous use, but is more active now and feels this will help. Has not been sexually active over the last month. Last sexual activity was end of 03/02/18. Patient " working too much to be sexually active". Patient is a smoker and aware this also is better choice. Questions addressed. Patient also relates she did not feel Metrogel Rx for BV due to insurance coverage. Requests alternative medication. No other concerns today.   O:        Healthy appearance, no apparent distress  WDWN   Orientation x 3 Affect normal   A: Contraception desired BV on last screening, unable to purchase Metrogel due to cost. Requests medication change.   Plan:  Discussion of Advantages and disadvantages of Depo Provera, OCP, IUD, warning signs and bleeding expectations.  Questions addressed. Patient requests Depo Provera today.  Rx Depo Provera 150 mg IM every 3 months x one year  Discussed Flagyl use should be less expensive, request change. Precautions with alcohol use during treatment given. Nausea and vomiting can occur. Questions addressed. Rx Flagyl see order with instructions.  Rv prn and as directed for Depo Provera   Hormonal Contraception Information Estrogen and progesterone (progestin) are hormones used in many forms of birth control (contraception). These 2 hormones make up most hormonal contraceptives. Hormonal contraceptives use either:   A combination of estrogen hormone and progesterone hormone in the form of a:  Pill. Typical pill packs include 21 days of active hormone pills and 7 days of non-hormonal pills.During the non-hormone week, you will have your period. There are certain types of pills that include more days of active hormones.  Patch.  The patch is placed on the lower abdomen every week for 3 weeks, and not on the fourth week.  Vaginal ring. The ring is placed in the vagina and left there for 3 weeks, and removed for 1 week.  Progesterone alone in the form of a(n):  Pill. Hormone pills are taken every day of the cycle.  Intrauterine device (IUD). The IUD is inserted during a menstrual period and removed or replaced every 5 years or less.  Implant. Plastic rods are placed under the skin of the upper arm and are removed or replaced every 3 years or less.  Injection. The injection is given once every 90 days. Pregnancy can still occur with any of these hormonal contraceptive methods. If there is any suspicion of pregnancy, take a pregnancy test and talk to your caregiver. ESTROGEN AND PROGESTERONE CONTRACEPTIVES Estrogen and progesterone contraceptives can prevent pregnancy by:  Stopping the actions of other reproductive hormones.   Stopping the release of an egg (ovulation).  Changing the lining of the uterus. This change makes it more difficult for an egg to implant. Side effects from estrogen occur more often in the first 2 or 3 months. Talk to your caregiver about what side effects may affect you. If you develop persistent side effects or they are severe, talk to your caregiver. PROGESTERONE CONTRACEPTIVES Progesterone only contraceptives can prevent pregnancy by:   Blocking ovulation.  Preventing the entry of sperm into the uterus by keeping the cervical mucus thick and sticky.  Slowing the action of fallopian tubes to slow sperm transport.  Changing the lining of the uterus. This change makes it more  difficult for an egg to implant. Side effects of progesterone can vary. Talk to your caregiver about what side effects may affect you. If you develop persistent side effects or they are severe, talk to your caregiver. Document Released: 08/09/2007 Document Revised: 10/12/2011 Document Reviewed:  11/22/2010 [facility] Patient Information 2013 [place], [place].   Patient is here for Depo Provera Injection Patient is within Depo Provera Calender Limits Patient is on her cycle and Pregnancy test is negative.  Next Depo Due between: Nov 19- Dec 3  Last AEX: 03/10/18  AEX Scheduled: 03/17/19  Patient is aware when next depo is due  Pt tolerated Injection well. RUOQ   Routed to provider for review, encounter closed.  Time spent in consultation 16 minutes regarding contraception.

## 2018-04-05 NOTE — Patient Instructions (Signed)
Bacterial Vaginosis Bacterial vaginosis is an infection of the vagina. It happens when too many germs (bacteria) grow in the vagina. This infection puts you at risk for infections from sex (STIs). Treating this infection can lower your risk for some STIs. You should also treat this if you are pregnant. It can cause your baby to be born early. Follow these instructions at home: Medicines  Take over-the-counter and prescription medicines only as told by your doctor.  Take or use your antibiotic medicine as told by your doctor. Do not stop taking or using it even if you start to feel better. General instructions  If you your sexual partner is a woman, tell her that you have this infection. She needs to get treatment if she has symptoms. If you have a female partner, he does not need to be treated.  During treatment: ? Avoid sex. ? Do not douche. ? Avoid alcohol as told. ? Avoid breastfeeding as told.  Drink enough fluid to keep your pee (urine) clear or pale yellow.  Keep your vagina and butt (rectum) clean. ? Wash the area with warm water every day. ? Wipe from front to back after you use the toilet.  Keep all follow-up visits as told by your doctor. This is important. Preventing this condition  Do not douche.  Use only warm water to wash around your vagina.  Use protection when you have sex. This includes: ? Latex condoms. ? Dental dams.  Limit how many people you have sex with. It is best to only have sex with the same person (be monogamous).  Get tested for STIs. Have your partner get tested.  Wear underwear that is cotton or lined with cotton.  Avoid tight pants and pantyhose. This is most important in summer.  Do not use any products that have nicotine or tobacco in them. These include cigarettes and e-cigarettes. If you need help quitting, ask your doctor.  Do not use illegal drugs.  Limit how much alcohol you drink. Contact a doctor if:  Your symptoms do not get  better, even after you are treated.  You have more discharge or pain when you pee (urinate).  You have a fever.  You have pain in your belly (abdomen).  You have pain with sex.  Your bleed from your vagina between periods. Summary  This infection happens when too many germs (bacteria) grow in the vagina.  Treating this condition can lower your risk for some infections from sex (STIs).  You should also treat this if you are pregnant. It can cause early (premature) birth.  Do not stop taking or using your antibiotic medicine even if you start to feel better. This information is not intended to replace advice given to you by your health care provider. Make sure you discuss any questions you have with your health care provider. Document Released: 04/28/2008 Document Revised: 04/04/2016 Document Reviewed: 04/04/2016 Elsevier Interactive Patient Education  2017 [place]. Medroxyprogesterone injection [Contraceptive] What is this medicine? MEDROXYPROGESTERONE (me DROX ee proe JES te rone) contraceptive injections prevent pregnancy. They provide effective birth control for 3 months. Depo-subQ Provera 104 is also used for treating pain related to endometriosis. This medicine may be used for other purposes; ask your health care provider or pharmacist if you have questions. COMMON BRAND NAME(S): Depo-Provera, Depo-subQ Provera 104 What should I tell my health care provider before I take this medicine? They need to know if you have any of these conditions: -frequently drink alcohol -asthma -blood vessel  disease or a history of a blood clot in the lungs or legs -bone disease such as osteoporosis -breast cancer -diabetes -eating disorder (anorexia nervosa or bulimia) -high blood pressure -HIV infection or AIDS -kidney disease -liver disease -mental depression -migraine -seizures (convulsions) -stroke -tobacco smoker -vaginal bleeding -an unusual or allergic reaction to  medroxyprogesterone, other hormones, medicines, foods, dyes, or preservatives -pregnant or trying to get pregnant -breast-feeding How should I use this medicine? Depo-Provera Contraceptive injection is given into a muscle. Depo-subQ Provera 104 injection is given under the skin. These injections are given by a health care professional. You must not be pregnant before getting an injection. The injection is usually given during the first 5 days after the start of a menstrual period or 6 weeks after delivery of a baby. Talk to your pediatrician regarding the use of this medicine in children. Special care may be needed. These injections have been used in female children who have started having menstrual periods. Overdosage: If you think you have taken too much of this medicine contact a poison control center or emergency room at once. NOTE: This medicine is only for you. Do not share this medicine with others. What if I miss a dose? Try not to miss a dose. You must get an injection once every 3 months to maintain birth control. If you cannot keep an appointment, call and reschedule it. If you wait longer than 13 weeks between Depo-Provera contraceptive injections or longer than 14 weeks between Depo-subQ Provera 104 injections, you could get pregnant. Use another method for birth control if you miss your appointment. You may also need a pregnancy test before receiving another injection. What may interact with this medicine? Do not take this medicine with any of the following medications: -bosentan This medicine may also interact with the following medications: -aminoglutethimide -antibiotics or medicines for infections, especially rifampin, rifabutin, rifapentine, and griseofulvin -aprepitant -barbiturate medicines such as phenobarbital or primidone -bexarotene -carbamazepine -medicines for seizures like ethotoin, felbamate, oxcarbazepine, phenytoin, topiramate -modafinil -St. John's wort This  list may not describe all possible interactions. Give your health care provider a list of all the medicines, herbs, non-prescription drugs, or dietary supplements you use. Also tell them if you smoke, drink alcohol, or use illegal drugs. Some items may interact with your medicine. What should I watch for while using this medicine? This drug does not protect you against HIV infection (AIDS) or other sexually transmitted diseases. Use of this product may cause you to lose calcium from your bones. Loss of calcium may cause weak bones (osteoporosis). Only use this product for more than 2 years if other forms of birth control are not right for you. The longer you use this product for birth control the more likely you will be at risk for weak bones. Ask your health care professional how you can keep strong bones. You may have a change in bleeding pattern or irregular periods. Many females stop having periods while taking this drug. If you have received your injections on time, your chance of being pregnant is very low. If you think you may be pregnant, see your health care professional as soon as possible. Tell your health care professional if you want to get pregnant within the next year. The effect of this medicine may last a long time after you get your last injection. What side effects may I notice from receiving this medicine? Side effects that you should report to your doctor or health care professional as soon as possible: -  allergic reactions like skin rash, itching or hives, swelling of the face, lips, or tongue -breast tenderness or discharge -breathing problems -changes in vision -depression -feeling faint or lightheaded, falls -fever -pain in the abdomen, chest, groin, or leg -problems with balance, talking, walking -unusually weak or tired -yellowing of the eyes or skin Side effects that usually do not require medical attention (report to your doctor or health care professional if they  continue or are bothersome): -acne -fluid retention and swelling -headache -irregular periods, spotting, or absent periods -temporary pain, itching, or skin reaction at site where injected -weight gain This list may not describe all possible side effects. Call your doctor for medical advice about side effects. You may report side effects to FDA at 1-800-FDA-1088. Where should I keep my medicine? This does not apply. The injection will be given to you by a health care professional. NOTE: This sheet is a summary. It may not cover all possible information. If you have questions about this medicine, talk to your doctor, pharmacist, or health care provider.  2018 Elsevier/Gold Standard (2008-08-10 18:37:56)

## 2018-06-22 ENCOUNTER — Ambulatory Visit: Payer: BLUE CROSS/BLUE SHIELD | Admitting: Certified Nurse Midwife

## 2018-06-22 ENCOUNTER — Ambulatory Visit: Payer: BLUE CROSS/BLUE SHIELD

## 2018-06-22 ENCOUNTER — Telehealth: Payer: Self-pay | Admitting: Certified Nurse Midwife

## 2018-06-22 NOTE — Telephone Encounter (Signed)
Patient canceled her appointment today due to "car trouble" and rescheduled to 06/24/2018.

## 2018-06-24 ENCOUNTER — Ambulatory Visit (INDEPENDENT_AMBULATORY_CARE_PROVIDER_SITE_OTHER): Payer: BLUE CROSS/BLUE SHIELD | Admitting: Certified Nurse Midwife

## 2018-06-24 ENCOUNTER — Encounter: Payer: Self-pay | Admitting: Certified Nurse Midwife

## 2018-06-24 ENCOUNTER — Other Ambulatory Visit: Payer: Self-pay

## 2018-06-24 VITALS — BP 120/80 | HR 70 | Resp 16 | Wt 242.0 lb

## 2018-06-24 DIAGNOSIS — B372 Candidiasis of skin and nail: Secondary | ICD-10-CM | POA: Diagnosis not present

## 2018-06-24 DIAGNOSIS — Z3042 Encounter for surveillance of injectable contraceptive: Secondary | ICD-10-CM

## 2018-06-24 MED ORDER — NYSTATIN-TRIAMCINOLONE 100000-0.1 UNIT/GM-% EX OINT: TOPICAL_OINTMENT | CUTANEOUS | 0 refills | Status: DC

## 2018-06-24 MED ORDER — MEDROXYPROGESTERONE ACETATE 150 MG/ML IM SUSP
150.0000 mg | Freq: Once | INTRAMUSCULAR | Status: AC
  Administered 2018-06-24: 150 mg via INTRAMUSCULAR

## 2018-06-24 MED ORDER — FLUCONAZOLE 150 MG PO TABS: ORAL_TABLET | ORAL | 0 refills | Status: DC

## 2018-06-24 NOTE — Progress Notes (Signed)
27 y.o. Single African American female [id number] here with complaint of  rash in groin area and in buttock area. Uses Dove body wash with cleaning. Itching very uncomfortable and would like to have help with. Perspires at night and feels this makes it worse.  Does use dryer sheets on underwear and clothing. No vaginal issues. Onset of symptoms 3-4 months and continues to be a problem. No history of diabetes. Contraception Depo Provera due today also. Having no issues with use and desires continuance. No other health issues today.  Review of Systems  Constitutional: Negative.   HENT: Negative.   Eyes: Negative.   Respiratory: Negative.   Cardiovascular: Negative.   Gastrointestinal: Negative.   Genitourinary: Negative.   Musculoskeletal: Negative.   Skin: Positive for itching and rash.       Groin and buttock area  Neurological: Negative.   Endo/Heme/Allergies: Negative.   Psychiatric/Behavioral: Negative.      Physical Exam  Constitutional: She is oriented to person, place, and time. She appears well-developed and well-nourished.  Genitourinary:    Pelvic exam was performed with patient supine. There is no rash or tenderness on the right labia. There is no rash or tenderness on the left labia.  Genitourinary Comments: Fine macular scaling rash noted in groin area down into buttock area.  See diagram. Wet prep taken of skin. No weeping or cracked skin. Exudate noted  Lymphadenopathy: No inguinal adenopathy noted on the right or left side.  Neurological: She is alert and oriented to person, place, and time.  Skin: Skin is warm and dry.  Psychiatric: She has a normal mood and affect. Her behavior is normal. Judgment and thought content normal.    Wet Prep results: KOH + yeast, Saline + yeast   A:Yeast dermatitis of groin and buttock crease area. Contraception Depo Provera due today, no issues with use  P:Discussed findings of yeast dermatitis and etiology. Discussed Aveeno sitz bath  for comfort and itching control.. Avoid moist clothes or pads for extended period of time. If working out in gym clothes  for long periods of time change underwear.  Coconut Oil use for skin protection prior to activity can be used to external skin for protection . Discussed once totally cleared can use Corn Starch dusting to keep skin dry and prevent reoccurrence. Rx Mycolog ointment bid to affected area for 7 days, see order Rx Diflucan 150 mg one today and repeat in 5 days. See order  Rx Depo Provera Im today.Marland Kitchen.  Rv prn if no change or problems

## 2018-07-04 ENCOUNTER — Other Ambulatory Visit: Payer: Self-pay

## 2018-07-04 DIAGNOSIS — B372 Candidiasis of skin and nail: Secondary | ICD-10-CM

## 2018-07-04 NOTE — Telephone Encounter (Signed)
[facility] fax came requesting that two RX be sent. One for nystatin oint and one for triamcinolone.  Please advise

## 2018-07-05 NOTE — Telephone Encounter (Signed)
Ok to send separate so insurance will probably cover and will be less cost

## 2018-07-12 NOTE — Telephone Encounter (Signed)
Medication refill request: Kenalog 0.025 ointment  Last AEX:  03/10/18 Next AEX: 03/17/19 Last MMG (if hormonal medication request): NA Refill authorized:  30g 0 rf     Medication refill request:  Nystatin Cream  Last AEX:  03/10/18 Next AEX: 03/17/19 Last MMG (if hormonal medication request): NA Refill authorized:  30g 0 rf

## 2018-07-12 NOTE — Telephone Encounter (Signed)
Need to know if she is having same problem as before when she was in. Will not refill if another issue until seen.

## 2018-07-13 MED ORDER — NYSTATIN 100000 UNIT/GM EX CREA: 1.0000 "application " | TOPICAL_CREAM | Freq: Two times a day (BID) | CUTANEOUS | 1 refills | Status: DC

## 2018-07-13 MED ORDER — TRIAMCINOLONE ACETONIDE 0.025 % EX OINT: 1.0000 "application " | TOPICAL_OINTMENT | Freq: Two times a day (BID) | CUTANEOUS | 1 refills | Status: DC

## 2018-07-13 NOTE — Telephone Encounter (Signed)
I spoke with patient she states that she is still having the same problem and not other issues have arose.

## 2018-07-13 NOTE — Telephone Encounter (Signed)
Will send refill.  

## 2018-09-09 ENCOUNTER — Ambulatory Visit (INDEPENDENT_AMBULATORY_CARE_PROVIDER_SITE_OTHER): Payer: BLUE CROSS/BLUE SHIELD

## 2018-09-09 VITALS — BP 122/78 | HR 68 | Ht 67.0 in | Wt 242.0 lb

## 2018-09-09 DIAGNOSIS — Z3042 Encounter for surveillance of injectable contraceptive: Secondary | ICD-10-CM

## 2018-09-09 MED ORDER — MEDROXYPROGESTERONE ACETATE 150 MG/ML IM SUSP
150.0000 mg | Freq: Once | INTRAMUSCULAR | Status: AC
  Administered 2018-09-09: 150 mg via INTRAMUSCULAR

## 2018-09-09 NOTE — Progress Notes (Signed)
Patient is here for Depo Provera Injection Patient is within Depo Provera Calender Limits 2/7-2/21 Next Depo Due between: 4/25-5/9 Last AEX: 03/10/18 DL AEX Scheduled: 7/32/20  Patient is aware when next depo is due  Pt tolerated Injection well in RUOQ.  Routed to provider for review, encounter closed.

## 2018-09-17 ENCOUNTER — Other Ambulatory Visit: Payer: Self-pay

## 2018-09-17 ENCOUNTER — Emergency Department (HOSPITAL_COMMUNITY)
Admission: EM | Admit: 2018-09-17 | Discharge: 2018-09-17 | Disposition: A | Discharge status: Home / Self Care | Payer: BLUE CROSS/BLUE SHIELD | Attending: Emergency Medicine | Admitting: Emergency Medicine

## 2018-09-17 ENCOUNTER — Encounter (HOSPITAL_COMMUNITY): Payer: Self-pay | Admitting: Emergency Medicine

## 2018-09-17 DIAGNOSIS — S81801A Unspecified open wound, right lower leg, initial encounter: Secondary | ICD-10-CM | POA: Diagnosis not present

## 2018-09-17 DIAGNOSIS — X58XXXA Exposure to other specified factors, initial encounter: Secondary | ICD-10-CM | POA: Insufficient documentation

## 2018-09-17 DIAGNOSIS — Y998 Other external cause status: Secondary | ICD-10-CM | POA: Diagnosis not present

## 2018-09-17 DIAGNOSIS — Y9389 Activity, other specified: Secondary | ICD-10-CM | POA: Diagnosis not present

## 2018-09-17 DIAGNOSIS — F17210 Nicotine dependence, cigarettes, uncomplicated: Secondary | ICD-10-CM | POA: Diagnosis not present

## 2018-09-17 DIAGNOSIS — Y929 Unspecified place or not applicable: Secondary | ICD-10-CM | POA: Insufficient documentation

## 2018-09-17 DIAGNOSIS — L089 Local infection of the skin and subcutaneous tissue, unspecified: Secondary | ICD-10-CM | POA: Diagnosis not present

## 2018-09-17 DIAGNOSIS — J45909 Unspecified asthma, uncomplicated: Secondary | ICD-10-CM | POA: Diagnosis not present

## 2018-09-17 DIAGNOSIS — T148XXA Other injury of unspecified body region, initial encounter: Secondary | ICD-10-CM

## 2018-09-17 MED ORDER — NAPROXEN 500 MG PO TABS: 500.0000 mg | ORAL_TABLET | Freq: Two times a day (BID) | ORAL | 0 refills | Status: DC

## 2018-09-17 MED ORDER — DOXYCYCLINE HYCLATE 100 MG PO CAPS: 100.0000 mg | ORAL_CAPSULE | Freq: Two times a day (BID) | ORAL | 0 refills | Status: DC

## 2018-09-17 MED ORDER — DOXYCYCLINE HYCLATE 100 MG PO TABS
100.0000 mg | ORAL_TABLET | Freq: Once | ORAL | Status: AC
  Administered 2018-09-17: 100 mg via ORAL
  Filled 2018-09-17: qty 1

## 2018-09-17 MED ORDER — BACITRACIN ZINC 500 UNIT/GM EX OINT
TOPICAL_OINTMENT | Freq: Two times a day (BID) | CUTANEOUS | Status: DC
  Administered 2018-09-17: 1 via TOPICAL
  Filled 2018-09-17: qty 0.9

## 2018-09-17 NOTE — Discharge Instructions (Addendum)
Take the medication as directed apply warm wet compresses to the area and follow up with your doctor. Return here as needed. You can use neosporin ointment on the areas and cover them.

## 2018-09-17 NOTE — ED Provider Notes (Signed)
[facility]-EMERGENCY DEPT Provider Note   CSN: [id number] Arrival date & time: 09/17/18  1416     History   Chief Complaint Chief Complaint  Patient presents with  . Wound Infection    HPI Frances Murphy is a 28 y.o. female who presents to the ED with c/o infected wounds bilateral lower extremities. Patient reports the areas have yellow drainage. Patient with hx of MRSA. Patient reports having similar areas in the past.   HPI  Past Medical History:  Diagnosis Date  . Asthma   . Migraines    with aura  . STD (sexually transmitted disease)    chlamydia    Patient Active Problem List   Diagnosis Date Noted  . Preterm delivery, delivered 04/13/2013  . Preterm premature rupture of membranes 02/01/2013  . Premature labor 01/26/2013  . 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 Asthma 11/02/2012  . Obesity 11/02/2012  . Family history of breast cancer in mother 11/02/2012  . Current smoker 11/02/2012    Past Surgical History:  Procedure Laterality Date  . None       OB History    Gravida  1   Para  1   Term  0   Preterm  1   AB  0   Living  0     SAB  0   TAB  0   Ectopic  0   Multiple  0   Live Births  1            Home Medications    Prior to Admission medications   Medication Sig Start Date End Date Taking? Authorizing Provider  albuterol (PROVENTIL HFA;VENTOLIN HFA) 108 (90 Base) MCG/ACT inhaler Inhale 2 puffs into the lungs every 4 (four) hours as needed for shortness of breath. 07/17/16   Mesner, Barbara Cower, MD  aspirin-acetaminophen-caffeine (EXCEDRIN MIGRAINE) [phone] MG tablet Take 1 tablet every 6 (six) hours as needed by mouth. 06/11/17   Ward, Chase Picket, PA-C  doxycycline (VIBRAMYCIN) 100 MG capsule Take 1 capsule (100 mg total) by mouth 2 (two) times daily. 09/17/18   Janne Napoleon, NP  fluconazole (DIFLUCAN) 150 MG tablet Take one tablet.  Repeat one tablet in 5 days. 06/24/18   Verner Chol, CNM    ibuprofen (ADVIL,MOTRIN) 600 MG tablet Take 1 tablet (600 mg total) by mouth every 6 (six) hours as needed. 09/13/17   Derwood Kaplan, MD  naproxen (NAPROSYN) 500 MG tablet Take 1 tablet (500 mg total) by mouth 2 (two) times daily. 09/17/18   Janne Napoleon, NP  nystatin cream (MYCOSTATIN) Apply 1 application topically 2 (two) times daily. Apply to affected area BID for up to 7 days. 07/13/18   Verner Chol, CNM  triamcinolone (KENALOG) 0.025 % ointment Apply 1 application topically 2 (two) times daily. 07/13/18   Verner Chol, CNM    Family History Family History  Problem Relation Age of Onset  . Hypertension Mother   . Breast cancer Mother   . Hypertension Father   . Hypertension Sister   . Hypertension Maternal Grandmother   . Cancer Paternal Grandmother   . Cancer Paternal Grandfather     Social History Social History   Tobacco Use  . Smoking status: Current Every Day Smoker    Years: 7.00    Types: Cigarettes  . Smokeless tobacco: Never Used  Substance Use Topics  . Alcohol use: No  . Drug use: No     Allergies  Penicillins   Review of Systems Review of Systems  Skin: Positive for color change and wound.  All other systems reviewed and are negative.    Physical Exam Updated Vital Signs BP 136/89 (BP Location: Left Arm)   Pulse 91   Temp 98.9 F (37.2 C) (Oral)   Resp 18   SpO2 96%   Physical Exam Vitals signs and nursing note reviewed.  Constitutional:      General: She is not in acute distress.    Appearance: She is well-developed.  HENT:     Head: Normocephalic.     Mouth/Throat:     Mouth: Mucous membranes are moist.  Eyes:     Conjunctiva/sclera: Conjunctivae normal.  Neck:     Musculoskeletal: Neck supple.  Cardiovascular:     Rate and Rhythm: Normal rate.  Pulmonary:     Effort: Pulmonary effort is normal.  Musculoskeletal: Normal range of motion.  Skin:    General: Skin is warm and dry.     Comments: There are 3 raised  pustular areas to the right lower leg with purulent drainage and surrounding erythema. No red streaking noted.   Neurological:     Mental Status: She is alert and oriented to person, place, and time.  Psychiatric:        Mood and Affect: Mood normal.      ED Treatments / Results  Labs (all labs ordered are listed, but only abnormal results are displayed) Labs Reviewed - No data to display  Radiology No results found.  Procedures Procedures (including critical care time)  Medications Ordered in ED Medications  bacitracin ointment (has no administration in time range)  doxycycline (VIBRA-TABS) tablet 100 mg (has no administration in time range)     Initial Impression / Assessment and Plan / ED Course  I have reviewed the triage vital signs and the nursing notes. 28 y.o. female here with infected areas to the right lower leg stable for d/c without fever, red streaking and does not appear toxic. Will treat with doxycycline and Naprosyn and patient to f/u with PCP. Return precautions discussed.   Final Clinical Impressions(s) / ED Diagnoses   Final diagnoses:  Wound infection    ED Discharge Orders         Ordered    doxycycline (VIBRAMYCIN) 100 MG capsule  2 times daily     09/17/18 1517    naproxen (NAPROSYN) 500 MG tablet  2 times daily     09/17/18 1517           Damian Leavell [place], [place] 09/17/18 1522    Sabas Sous, MD 09/21/18 1724

## 2018-09-17 NOTE — ED Triage Notes (Signed)
Pt with several "bumps" on bilateral lower extremities that are painful and yellow / green draining pus. Pt states she has had this before and cultures did come back positive for MRSA. Pt has had them covered due to be painful when touching pants.

## 2018-09-28 ENCOUNTER — Other Ambulatory Visit: Payer: Self-pay

## 2018-09-28 ENCOUNTER — Emergency Department (HOSPITAL_COMMUNITY): Payer: BLUE CROSS/BLUE SHIELD

## 2018-09-28 ENCOUNTER — Encounter (HOSPITAL_COMMUNITY): Payer: Self-pay

## 2018-09-28 ENCOUNTER — Emergency Department (HOSPITAL_COMMUNITY)
Admission: EM | Admit: 2018-09-28 | Discharge: 2018-09-28 | Disposition: A | Discharge status: Home / Self Care | Payer: BLUE CROSS/BLUE SHIELD | Attending: Emergency Medicine | Admitting: Emergency Medicine

## 2018-09-28 DIAGNOSIS — J111 Influenza due to unidentified influenza virus with other respiratory manifestations: Secondary | ICD-10-CM | POA: Insufficient documentation

## 2018-09-28 DIAGNOSIS — R05 Cough: Secondary | ICD-10-CM | POA: Diagnosis not present

## 2018-09-28 DIAGNOSIS — R0602 Shortness of breath: Secondary | ICD-10-CM | POA: Diagnosis not present

## 2018-09-28 DIAGNOSIS — Z79899 Other long term (current) drug therapy: Secondary | ICD-10-CM | POA: Diagnosis not present

## 2018-09-28 DIAGNOSIS — J45909 Unspecified asthma, uncomplicated: Secondary | ICD-10-CM | POA: Insufficient documentation

## 2018-09-28 DIAGNOSIS — F17210 Nicotine dependence, cigarettes, uncomplicated: Secondary | ICD-10-CM | POA: Insufficient documentation

## 2018-09-28 DIAGNOSIS — R69 Illness, unspecified: Secondary | ICD-10-CM

## 2018-09-28 LAB — GROUP A STREP BY PCR: Group A Strep by PCR: NOT DETECTED

## 2018-09-28 LAB — INFLUENZA PANEL BY PCR (TYPE A & B)
Influenza A By PCR: NEGATIVE
Influenza B By PCR: NEGATIVE

## 2018-09-28 LAB — POC URINE PREG, ED: Preg Test, Ur: NEGATIVE

## 2018-09-28 MED ORDER — ACETAMINOPHEN 325 MG PO TABS
650.0000 mg | ORAL_TABLET | Freq: Once | ORAL | Status: AC
  Administered 2018-09-28: 650 mg via ORAL
  Filled 2018-09-28: qty 2

## 2018-09-28 MED ORDER — BENZONATATE 100 MG PO CAPS: 100.0000 mg | ORAL_CAPSULE | Freq: Three times a day (TID) | ORAL | 0 refills | Status: DC

## 2018-09-28 NOTE — Discharge Instructions (Addendum)
You have been diagnosed today with Influenza-like illness.  At this time there does not appear to be the presence of an emergent medical condition, however there is always the potential for conditions to change. Please read and follow the below instructions.  Please return to the Emergency Department immediately for any new or worsening symptoms or if your symptoms do not improve within 3 days. Please be sure to follow up with your Primary Care Provider within one week regarding your visit today; please call their office to schedule an appointment even if you are feeling better for a follow-up visit. Please drink plenty of water and get plenty of rest over the next few days to help with your symptoms. Please take Ibuprofen (Advil, motrin) and Tylenol (acetaminophen) to relieve your pain.  You may take up to 400 MG (2 pills) of normal strength ibuprofen every 8 hours as needed.  In between doses of ibuprofen you make take tylenol, up to 500 mg (one extra strength pills).  Do not take more than 3,000 mg tylenol in a 24 hour period.  Please check all medication labels as many medications such as pain and cold medications may contain tylenol.  Do not drink alcohol while taking these medications.  Do not take other NSAID'S while taking ibuprofen (such as aleve or naproxen).  Please take ibuprofen with food to decrease stomach upset. You may use the cough medication Tessalon as prescribed.  Get help right away if: You have shortness of breath You have severe or persistent: Headache. Ear pain. Sinus pain. Chest pain. You have chronic lung disease along with any of the following: Wheezing. Prolonged cough. Coughing up blood. A change in your usual mucus. You have a stiff neck. You have changes in your: Vision. Hearing. Thinking. Mood.   Please read the additional information packets attached to your discharge summary.

## 2018-09-28 NOTE — ED Provider Notes (Signed)
[facility]-EMERGENCY DEPT Provider Note   CSN: [id number] Arrival date & time: 09/28/18  1137    History   Chief Complaint Chief Complaint  Patient presents with  . Generalized Body Aches  . Fever  . Cough    HPI Frances Murphy is a 28 y.o. female presenting today for 1 day history of cough, subjective fevers, chills and generalized body aches.  Patient reports that her symptoms began yesterday morning and have gradually progressed since onset.  Patient describes a mild intermittent nonproductive cough without chest pain, shortness of breath or hemoptysis.  Patient reports feeling warm since yesterday however is not measured a fever.  She does endorse feeling cold in between feeling hot.  Additionally patient endorses a generalized body aches without focal area of pain.  She denies injury or trauma.  She describes a mild aching sensation to her muscles and joints constant worsened with movement. Patient reports that she took ibuprofen at 5 AM this morning with moderate relief of her symptoms.  Of note patient with history of asthma.  She denies shortness of breath.  She states that she feels she is breathing at baseline and does not wish to have breathing treatment.  Patient did not get her flu shot this year.    HPI  Past Medical History:  Diagnosis Date  . Asthma   . Migraines    with aura  . STD (sexually transmitted disease)    chlamydia    Patient Active Problem List   Diagnosis Date Noted  . Preterm delivery, delivered 04/13/2013  . Preterm premature rupture of membranes 02/01/2013  . Premature labor 01/26/2013  . 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 Asthma 11/02/2012  . Obesity 11/02/2012  . Family history of breast cancer in mother 11/02/2012  . Current smoker 11/02/2012    Past Surgical History:  Procedure Laterality Date  . None       OB History    Gravida  1   Para  1   Term  0   Preterm  1   AB  0   Living  0     SAB  0   TAB  0   Ectopic  0   Multiple  0   Live Births  1            Home Medications    Prior to Admission medications   Medication Sig Start Date End Date Taking? Authorizing Provider  albuterol (PROVENTIL HFA;VENTOLIN HFA) 108 (90 Base) MCG/ACT inhaler Inhale 2 puffs into the lungs every 4 (four) hours as needed for shortness of breath. 07/17/16   Mesner, Barbara Cower, MD  aspirin-acetaminophen-caffeine (EXCEDRIN MIGRAINE) [phone] MG tablet Take 1 tablet every 6 (six) hours as needed by mouth. 06/11/17   Ward, Chase Picket, PA-C  doxycycline (VIBRAMYCIN) 100 MG capsule Take 1 capsule (100 mg total) by mouth 2 (two) times daily. 09/17/18   Janne Napoleon, NP  fluconazole (DIFLUCAN) 150 MG tablet Take one tablet.  Repeat one tablet in 5 days. 06/24/18   Verner Chol, CNM  ibuprofen (ADVIL,MOTRIN) 600 MG tablet Take 1 tablet (600 mg total) by mouth every 6 (six) hours as needed. 09/13/17   Derwood Kaplan, MD  naproxen (NAPROSYN) 500 MG tablet Take 1 tablet (500 mg total) by mouth 2 (two) times daily. 09/17/18   Janne Napoleon, NP  nystatin cream (MYCOSTATIN) Apply 1 application topically 2 (two) times daily. Apply to affected area BID for up to 7  days. 07/13/18   Verner Chol, CNM  triamcinolone (KENALOG) 0.025 % ointment Apply 1 application topically 2 (two) times daily. 07/13/18   Verner Chol, CNM    Family History Family History  Problem Relation Age of Onset  . Hypertension Mother   . Breast cancer Mother   . Hypertension Father   . Hypertension Sister   . Hypertension Maternal Grandmother   . Cancer Paternal Grandmother   . Cancer Paternal Grandfather     Social History Social History   Tobacco Use  . Smoking status: Current Every Day Smoker    Packs/day: 0.50    Years: 7.00    Pack years: 3.50    Types: Cigarettes  . Smokeless tobacco: Never Used  Substance Use Topics  . Alcohol use: No  . Drug use: No     Allergies    Penicillins   Review of Systems Review of Systems  Constitutional: Positive for chills and fever. Negative for diaphoresis.  HENT: Positive for rhinorrhea. Negative for drooling, facial swelling, sore throat, trouble swallowing and voice change.   Respiratory: Positive for cough. Negative for shortness of breath.   Cardiovascular: Negative.  Negative for chest pain and leg swelling.  Gastrointestinal: Positive for nausea. Negative for abdominal pain, diarrhea and vomiting.  Genitourinary: Negative.  Negative for dysuria and hematuria.  Musculoskeletal: Positive for arthralgias and myalgias. Negative for neck pain and neck stiffness.  Skin: Negative.  Negative for wound.  Neurological: Negative.  Negative for dizziness, syncope, weakness, numbness and headaches.   Physical Exam Updated Vital Signs BP (!) 140/92   Pulse 87   Temp 99 F (37.2 C) (Oral)   Resp 16   Ht  (1.702 m)   Wt 109.8 kg   SpO2 99%   BMI 37.90 kg/m   Physical Exam Constitutional:      General: She is not in acute distress.    Appearance: Normal appearance. She is well-developed. She is obese. She is not ill-appearing or diaphoretic.  HENT:     Head: Normocephalic and atraumatic.     Jaw: There is normal jaw occlusion. No trismus.     Right Ear: Tympanic membrane, ear canal and external ear normal.     Left Ear: Tympanic membrane, ear canal and external ear normal.     Nose: Rhinorrhea present. Rhinorrhea is clear.     Mouth/Throat:     Lips: Pink.     Mouth: Mucous membranes are moist.     Pharynx: Oropharynx is clear. Uvula midline.     Comments: The patient has normal phonation and is in control of secretions. No stridor.  Midline uvula without edema. Soft palate rises symmetrically. No tonsillar erythema, swelling or exudates. Tongue protrusion is normal, floor of mouth is soft. No trismus. No creptius on neck palpation. No gingival erythema or fluctuance noted. Mucus membranes moist. Eyes:      General: Vision grossly intact. Gaze aligned appropriately.     Conjunctiva/sclera: Conjunctivae normal.     Pupils: Pupils are equal, round, and reactive to light.  Neck:     Musculoskeletal: Full passive range of motion without pain, normal range of motion and neck supple.     Trachea: Trachea and phonation normal. No tracheal tenderness or tracheal deviation.     Meningeal: Brudzinski's sign absent.  Cardiovascular:     Rate and Rhythm: Normal rate and regular rhythm.     Pulses:          Dorsalis pedis pulses  are 2+ on the right side and 2+ on the left side.       Posterior tibial pulses are 2+ on the right side and 2+ on the left side.     Heart sounds: Normal heart sounds.  Pulmonary:     Effort: Pulmonary effort is normal. No respiratory distress.     Breath sounds: Normal breath sounds and air entry. No wheezing or rhonchi.  Chest:     Chest wall: No tenderness.  Abdominal:     General: There is no distension.     Palpations: Abdomen is soft.     Tenderness: There is no abdominal tenderness. There is no guarding or rebound.  Musculoskeletal: Normal range of motion.     Right lower leg: Normal. She exhibits no tenderness. No edema.     Left lower leg: Normal. She exhibits no tenderness. No edema.  Skin:    General: Skin is warm and dry.  Neurological:     Mental Status: She is alert.     GCS: GCS eye subscore is 4. GCS verbal subscore is 5. GCS motor subscore is 6.     Comments: Speech is clear and goal oriented, follows commands Major Cranial nerves without deficit, no facial droop Moves extremities without ataxia, coordination intact  Psychiatric:        Behavior: Behavior normal.    ED Treatments / Results  Labs (all labs ordered are listed, but only abnormal results are displayed) Labs Reviewed  GROUP A STREP BY PCR  INFLUENZA PANEL BY PCR (TYPE A & B)  POC URINE PREG, ED    EKG None  Radiology Dg Chest 2 View  Result Date: 09/28/2018 CLINICAL DATA:   Cough, shortness of breath and fever. EXAM: CHEST - 2 VIEW COMPARISON:  PA and lateral chest 07/17/2016. FINDINGS: Lungs clear. Heart size normal. No pneumothorax or pleural fluid. No bony abnormality. IMPRESSION: Negative chest. Electronically Signed   By: Drusilla Kanner M.D.   On: 09/28/2018 14:50    Procedures Procedures (including critical care time)  Medications Ordered in ED Medications  acetaminophen (TYLENOL) tablet 650 mg (650 mg Oral Given 09/28/18 1300)     Initial Impression / Assessment and Plan / ED Course  I have reviewed the triage vital signs and the nursing notes.  Pertinent labs & imaging results that were available during my care of the patient were reviewed by me and considered in my medical decision making (see chart for details).    28 year old female with history of asthma presenting today for flulike symptoms beginning yesterday morning.  On arrival patient overall well-appearing and in no acute distress.  She is endorsing mild nonproductive cough, rhinorrhea, body aches and nausea.  She denies hemoptysis, shortness of breath, chest pain, wheezing, vomiting or diarrhea.  Patient did not receive her flu shot this year.  Patient is tolerating p.o. without difficulty. - Chest x-ray negative Pregnancy test negative Strep test negative Flu panel negative - Tylenol given, symptoms improved.  On reassessment lungs clear to auscultation bilaterally and patient is in no acute distress.  Suspect patient with viral illness today.  Do not suspect acute bacterial infection necessitating antibiotics at this time.  Patient counseled on home therapies including increasing her water intake, rest and use of OTC anti-inflammatories.  Tessalon prescribed for cough.  Patient with history of asthma but does not appear to be in exacerbation today, she states that she is breathing at baseline and lungs are clear.  She is tolerating p.o.  without difficulty and states understanding of care  plan.  No sign of PTA, Ludwig's, retro-pharyngeal abscess or other deep tissue infections of the head or neck.  No meningeal signs patient is not ill-appearing.  At this time there does not appear to be any evidence of an acute emergency medical condition and the patient appears stable for discharge with appropriate outpatient follow up. Diagnosis was discussed with patient who verbalizes understanding of care plan and is agreeable to discharge. I have discussed return precautions with patient and Mother who verbalize understanding of return precautions. Patient strongly encouraged to follow-up with their PCP. All questions answered.  Patient has been discharged in good condition.   Note: Portions of this report may have been transcribed using voice recognition software. Every effort was made to ensure accuracy; however, inadvertent computerized transcription errors may still be present. Final Clinical Impressions(s) / ED Diagnoses   Final diagnoses:  Influenza-like illness    ED Discharge Orders    None       Elizabeth Palau 09/28/18 1509    Bethann Berkshire, MD 09/28/18 [phone]

## 2018-09-28 NOTE — ED Triage Notes (Signed)
Patient c/o a non productive cough, expiratory wheezing, fever last pm and generalized body aches that started yestesday.

## 2018-09-28 NOTE — ED Notes (Signed)
Bed: WTR6 Expected date:  Expected time:  Means of arrival:  Comments: 

## 2018-11-04 ENCOUNTER — Telehealth: Payer: Self-pay | Admitting: Certified Nurse Midwife

## 2018-11-04 DIAGNOSIS — B372 Candidiasis of skin and nail: Secondary | ICD-10-CM

## 2018-11-04 MED ORDER — NYSTATIN 100000 UNIT/GM EX CREA: 1.0000 "application " | TOPICAL_CREAM | Freq: Two times a day (BID) | CUTANEOUS | 0 refills | Status: DC

## 2018-11-04 MED ORDER — TRIAMCINOLONE ACETONIDE 0.025 % EX OINT: 1.0000 "application " | TOPICAL_OINTMENT | Freq: Two times a day (BID) | CUTANEOUS | 0 refills | Status: DC

## 2018-11-04 NOTE — Telephone Encounter (Signed)
Spoke with patient. Patient states that she still has a rash in her groin and buttock area. Was seen on 06/24/2018 with Leota Sauers CNM for this. Rx for Mycostatin and Kenalog were sent in for patient. Patient states she was unable to pick these up due to cost. Has been using coconut oil for skin protection. States she now would like to purchase the prescription as she is having the same issue. Denies any new symptoms. Rx for Mycostatin apply 1 application topically 2 times daily #30 g 0RF and Kenalog apply 1 application topically 2 times daily #30 g 0RFsent to pharmacy on file. Patient verbalizes understanding. Aware she will need to be seen if symptoms do not improve or if they worsen.  Routing to provider and will close encounter.

## 2018-11-04 NOTE — Telephone Encounter (Signed)
Patient states prescription for cream needs to be resent to pharmacy. Unsure of name. [facility] [address]

## 2018-11-28 ENCOUNTER — Ambulatory Visit: Payer: BLUE CROSS/BLUE SHIELD

## 2018-11-30 ENCOUNTER — Other Ambulatory Visit: Payer: Self-pay

## 2018-12-01 ENCOUNTER — Ambulatory Visit (INDEPENDENT_AMBULATORY_CARE_PROVIDER_SITE_OTHER): Payer: BLUE CROSS/BLUE SHIELD

## 2018-12-01 ENCOUNTER — Other Ambulatory Visit: Payer: Self-pay

## 2018-12-01 VITALS — BP 118/80 | HR 70 | Temp 99.1°F | Resp 16 | Wt 248.0 lb

## 2018-12-01 DIAGNOSIS — Z3042 Encounter for surveillance of injectable contraceptive: Secondary | ICD-10-CM | POA: Diagnosis not present

## 2018-12-01 MED ORDER — MEDROXYPROGESTERONE ACETATE 150 MG/ML IM SUSP
150.0000 mg | Freq: Once | INTRAMUSCULAR | Status: AC
  Administered 2018-12-01: 150 mg via INTRAMUSCULAR

## 2018-12-01 NOTE — Progress Notes (Signed)
Patient is here for Depo Provera Injection Patient is within Depo Provera Calender Limits April 25-may 9th Next Depo Due between: 7/16-7/30 Last AEX: 03-10-18 AEX Scheduled: 03-17-2019  Patient is aware when next depo is due & is scheduled.  Pt tolerated Injection well in LUOQ  Routed to provider for review, encounter closed.

## 2019-02-02 ENCOUNTER — Ambulatory Visit
Admission: EM | Admit: 2019-02-02 | Discharge: 2019-02-02 | Disposition: A | Discharge status: Home / Self Care | Payer: BC Managed Care – PPO | Attending: Physician Assistant | Admitting: Physician Assistant

## 2019-02-02 DIAGNOSIS — R519 Headache, unspecified: Secondary | ICD-10-CM

## 2019-02-02 DIAGNOSIS — R51 Headache: Secondary | ICD-10-CM | POA: Diagnosis not present

## 2019-02-02 MED ORDER — KETOROLAC TROMETHAMINE 30 MG/ML IJ SOLN
30.0000 mg | Freq: Once | INTRAMUSCULAR | Status: AC
  Administered 2019-02-02: 30 mg via INTRAMUSCULAR

## 2019-02-02 MED ORDER — DEXAMETHASONE SODIUM PHOSPHATE 10 MG/ML IJ SOLN
10.0000 mg | Freq: Once | INTRAMUSCULAR | Status: AC
  Administered 2019-02-02: 10 mg via INTRAMUSCULAR

## 2019-02-02 MED ORDER — METOCLOPRAMIDE HCL 5 MG/ML IJ SOLN
5.0000 mg | Freq: Once | INTRAMUSCULAR | Status: AC
  Administered 2019-02-02: 5 mg via INTRAMUSCULAR

## 2019-02-02 NOTE — ED Provider Notes (Signed)
[name]    CSN: [id number] Arrival date & time: 02/02/19  1254     History   Chief Complaint Chief Complaint  Patient presents with  . Migraine    HPI Frances Murphy is a 28 y.o. female.   64 year ol female comes in for 2 week history of intermittent headaches. Pain is to the frontal head, throbbing with photophobia. No phonophobia, nausea, vomiting. No vision changes/blurry vision. Denies URI symptoms such as cough, congestion, sore throat. Denies fever, chills, night sweats. No loss of taste or smell. Denies dizziness, weakness, syncope. States ibuprofen without relief. Takes excedrin with temporary relief. Denies recent head injury/loss of consciousness. Current every day smoker. States usually drinks green tea, but has had decreased consumption. No alcohol use.       Past Medical History:  Diagnosis Date  . Asthma   . Migraines    with aura  . STD (sexually transmitted disease)    chlamydia    Patient Active Problem List   Diagnosis Date Noted  . Preterm delivery, delivered 04/13/2013  . Preterm premature rupture of membranes 02/01/2013  . Premature labor 01/26/2013  . 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 Asthma 11/02/2012  . Obesity 11/02/2012  . Family history of breast cancer in mother 11/02/2012  . Current smoker 11/02/2012    Past Surgical History:  Procedure Laterality Date  . None      OB History    Gravida  1   Para  1   Term  0   Preterm  1   AB  0   Living  0     SAB  0   TAB  0   Ectopic  0   Multiple  0   Live Births  1            Home Medications    Prior to Admission medications   Medication Sig Start Date End Date Taking? Authorizing Provider  albuterol (PROVENTIL HFA;VENTOLIN HFA) 108 (90 Base) MCG/ACT inhaler Inhale 2 puffs into the lungs every 4 (four) hours as needed for shortness of breath. 07/17/16   Mesner, Corene Cornea, MD  aspirin-acetaminophen-caffeine (EXCEDRIN MIGRAINE) [phone] MG  tablet Take 1 tablet every 6 (six) hours as needed by mouth. 06/11/17   Ward, Ozella Almond, PA-C  ibuprofen (ADVIL,MOTRIN) 600 MG tablet Take 1 tablet (600 mg total) by mouth every 6 (six) hours as needed. 09/13/17   Varney Biles, MD  naproxen (NAPROSYN) 500 MG tablet Take 1 tablet (500 mg total) by mouth 2 (two) times daily. 09/17/18   Ashley Murrain, NP  nystatin cream (MYCOSTATIN) Apply 1 application topically 2 (two) times daily. Apply to affected area BID for up to 7 days. 11/04/18   Salvadore Dom, MD  triamcinolone (KENALOG) 0.025 % ointment Apply 1 application topically 2 (two) times daily. 11/04/18   Salvadore Dom, MD    Family History Family History  Problem Relation Age of Onset  . Hypertension Mother   . Breast cancer Mother   . Hypertension Father   . Hypertension Sister   . Hypertension Maternal Grandmother   . Cancer Paternal Grandmother   . Cancer Paternal Grandfather     Social History Social History   Tobacco Use  . Smoking status: Current Every Day Smoker    Packs/day: 0.50    Years: 7.00    Pack years: 3.50    Types: Cigarettes  . Smokeless tobacco: Never Used  Substance Use Topics  .  Alcohol use: No  . Drug use: No     Allergies   Penicillins   Review of Systems Review of Systems  Reason unable to perform ROS: See HPI as above.     Physical Exam Triage Vital Signs ED Triage Vitals  Enc Vitals Group     BP 02/02/19 1300 (!) 141/95     Pulse Rate 02/02/19 1300 75     Resp 02/02/19 1300 18     Temp 02/02/19 1300 98.7 F (37.1 C)     Temp Source 02/02/19 1300 Oral     SpO2 02/02/19 1300 96 %     Weight --      Height --      Head Circumference --      Peak Flow --      Pain Score 02/02/19 1301 6     Pain Loc --      Pain Edu? --      Excl. in GC? --    No data found.  Updated Vital Signs BP (!) 141/95 (BP Location: Left Arm)   Pulse 75   Temp 98.7 F (37.1 C) (Oral)   Resp 18   SpO2 96%   Physical Exam  Constitutional:      General: She is not in acute distress.    Appearance: She is well-developed. She is not ill-appearing, toxic-appearing or diaphoretic.  HENT:     Head: Normocephalic and atraumatic.     Nose:     Right Sinus: No maxillary sinus tenderness or frontal sinus tenderness.     Left Sinus: No maxillary sinus tenderness or frontal sinus tenderness.     Mouth/Throat:     Mouth: Mucous membranes are moist.     Pharynx: Oropharynx is clear.  Eyes:     Extraocular Movements: Extraocular movements intact.     Conjunctiva/sclera: Conjunctivae normal.     Pupils: Pupils are equal, round, and reactive to light.     Comments: No photophobia on exam.   Neck:     Musculoskeletal: Normal range of motion and neck supple.  Cardiovascular:     Rate and Rhythm: Normal rate and regular rhythm.     Heart sounds: Normal heart sounds.  Pulmonary:     Effort: Pulmonary effort is normal. No respiratory distress.     Breath sounds: Normal breath sounds and air entry.  Neurological:     Mental Status: She is alert and oriented to person, place, and time.     GCS: GCS eye subscore is 4. GCS verbal subscore is 5. GCS motor subscore is 6.     Cranial Nerves: Cranial nerves are intact.     Sensory: Sensation is intact.     Motor: Motor function is intact.     Coordination: Coordination is intact.     Gait: Gait is intact.    UC Treatments / Results  Labs (all labs ordered are listed, but only abnormal results are displayed) Labs Reviewed - No data to display  EKG   Radiology No results found.  Procedures Procedures (including critical care time)  Medications Ordered in UC Medications  metoCLOPramide (REGLAN) injection 5 mg (5 mg Intramuscular Given 02/02/19 1336)  dexamethasone (DECADRON) injection 10 mg (10 mg Intramuscular Given 02/02/19 1336)  ketorolac (TORADOL) 30 MG/ML injection 30 mg (30 mg Intramuscular Given 02/02/19 1336)    Initial Impression / Assessment and Plan / UC  Course  I have reviewed the triage vital signs and the nursing notes.  Pertinent  labs & imaging results that were available during my care of the patient were reviewed by me and considered in my medical decision making (see chart for details).    No alarming signs on exam.  Discussed possible caffeine withdrawal causing frequent headaches. Discussed possible migraine as well given history of. Will provide symptomatic treatment with Toradol, Decadron, Reglan injection in office today.  Push fluids.  Return precautions given.  Patient expresses understanding and agrees to plan.  Final Clinical Impressions(s) / UC Diagnoses   Final diagnoses:  Acute intractable headache, unspecified headache type   ED Prescriptions    None        Belinda FisherYu, Lucindy Borel V, PA-C 02/02/19 1648

## 2019-02-02 NOTE — ED Triage Notes (Signed)
Pt c/o migraine headache for the past 2wks, states was unable to work yesterday and today

## 2019-02-02 NOTE — Discharge Instructions (Signed)
No alarming signs on exam. Toradol, reglan, decadron injection in office today. As discussed, may also have symptoms due to caffeine withdrawal. Keep hydrated, urine should be clear to pale yellow in color. Start over the counter zyrtec, flonase for sinus pressure. If experiencing worsening symptoms, vision loss with headache, worse headache of your life, weakness, passing out, go to the ED for further evaluation needed.

## 2019-02-07 ENCOUNTER — Other Ambulatory Visit: Payer: Self-pay

## 2019-02-07 ENCOUNTER — Ambulatory Visit
Admission: EM | Admit: 2019-02-07 | Discharge: 2019-02-07 | Disposition: A | Discharge status: Home / Self Care | Payer: BC Managed Care – PPO | Attending: Physician Assistant | Admitting: Physician Assistant

## 2019-02-07 DIAGNOSIS — R51 Headache: Secondary | ICD-10-CM

## 2019-02-07 DIAGNOSIS — R519 Headache, unspecified: Secondary | ICD-10-CM

## 2019-02-07 MED ORDER — FLUTICASONE PROPIONATE 50 MCG/ACT NA SUSP: 2.0000 | Freq: Every day | NASAL | 0 refills | Status: DC

## 2019-02-07 MED ORDER — PREDNISONE 50 MG PO TABS: 50.0000 mg | ORAL_TABLET | Freq: Every day | ORAL | 0 refills | Status: DC

## 2019-02-07 NOTE — ED Provider Notes (Signed)
[name]    CSN: [id number] Arrival date & time: 02/07/19  1328     History   Chief Complaint Chief Complaint  Patient presents with  . Headache    HPI Frances Murphy is a 28 y.o. female.   28 year old female comes in for continued headache after being seen 02/02/2019. See HPI below for further details of headache.  At that time, she was given Toradol, Reglan Decadron injection with good relief.  However, she woke up the next day with an headache, and has been continuing with intermittent headaches.  Continues to be frontal, throbbing in sensation.  She denies worsening of symptoms, changes in symptoms.  Continues to deny chest cough, congestion, sore throat.  Denies fever, chills, night sweats.  She has still been taking Excedrin with some relief.  HPI on 02/02/2019 "[age] year ol female comes in for 2 week history of intermittent headaches. Pain is to the frontal head, throbbing with photophobia. No phonophobia, nausea, vomiting. No vision changes/blurry vision. Denies URI symptoms such as cough, congestion, sore throat. Denies fever, chills, night sweats. No loss of taste or smell. Denies dizziness, weakness, syncope. States ibuprofen without relief. Takes excedrin with temporary relief. Denies recent head injury/loss of consciousness. Current every day smoker. States usually drinks green tea, but has had decreased consumption. No alcohol use. "     Past Medical History:  Diagnosis Date  . Asthma   . Migraines    with aura  . STD (sexually transmitted disease)    chlamydia    Patient Active Problem List   Diagnosis Date Noted  . Preterm delivery, delivered 04/13/2013  . Preterm premature rupture of membranes 02/01/2013  . Premature labor 01/26/2013  . 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 Asthma 11/02/2012  . Obesity 11/02/2012  . Family history of breast cancer in mother 11/02/2012  . Current smoker 11/02/2012    Past Surgical History:  Procedure  Laterality Date  . None      OB History    Gravida  1   Para  1   Term  0   Preterm  1   AB  0   Living  0     SAB  0   TAB  0   Ectopic  0   Multiple  0   Live Births  1            Home Medications    Prior to Admission medications   Medication Sig Start Date End Date Taking? Authorizing Provider  albuterol (PROVENTIL HFA;VENTOLIN HFA) 108 (90 Base) MCG/ACT inhaler Inhale 2 puffs into the lungs every 4 (four) hours as needed for shortness of breath. 07/17/16   Mesner, Barbara CowerJason, MD  aspirin-acetaminophen-caffeine (EXCEDRIN MIGRAINE) [phone] MG tablet Take 1 tablet every 6 (six) hours as needed by mouth. 06/11/17   Ward, Chase PicketJaime Pilcher, PA-C  fluticasone (FLONASE) 50 MCG/ACT nasal spray Place 2 sprays into both nostrils daily. 02/07/19   Cathie HoopsYu, Ronisha Herringshaw V, PA-C  ibuprofen (ADVIL,MOTRIN) 600 MG tablet Take 1 tablet (600 mg total) by mouth every 6 (six) hours as needed. 09/13/17   Derwood KaplanNanavati, Ankit, MD  naproxen (NAPROSYN) 500 MG tablet Take 1 tablet (500 mg total) by mouth 2 (two) times daily. 09/17/18   Janne NapoleonNeese, Hope M, NP  predniSONE (DELTASONE) 50 MG tablet Take 1 tablet (50 mg total) by mouth daily with breakfast. 02/07/19   Belinda FisherYu, Symir Mah V, PA-C    Family History Family History  Problem Relation Age of Onset  .  Hypertension Mother   . Breast cancer Mother   . Hypertension Father   . Hypertension Sister   . Hypertension Maternal Grandmother   . Cancer Paternal Grandmother   . Cancer Paternal Grandfather     Social History Social History   Tobacco Use  . Smoking status: Current Every Day Smoker    Packs/day: 0.50    Years: 7.00    Pack years: 3.50    Types: Cigarettes  . Smokeless tobacco: Never Used  Substance Use Topics  . Alcohol use: No  . Drug use: No     Allergies   Penicillins   Review of Systems Review of Systems  Reason unable to perform ROS: See HPI as above.     Physical Exam Triage Vital Signs ED Triage Vitals [02/07/19 1339]  Enc Vitals Group      BP 129/86     Pulse Rate 70     Resp 18     Temp 98.4 F (36.9 C)     Temp src      SpO2 100 %     Weight      Height      Head Circumference      Peak Flow      Pain Score      Pain Loc      Pain Edu?      Excl. in [place]?    No data found.  Updated Vital Signs BP 129/86 (BP Location: Left Arm)   Pulse 70   Temp 98.4 F (36.9 C)   Resp 18   SpO2 100%   Physical Exam Constitutional:      General: She is not in acute distress.    Appearance: She is well-developed. She is not diaphoretic.  HENT:     Head: Normocephalic and atraumatic.     Right Ear: Tympanic membrane, ear canal and external ear normal. Tympanic membrane is not erythematous or bulging.     Left Ear: Tympanic membrane, ear canal and external ear normal. Tympanic membrane is not erythematous or bulging.     Nose:     Right Sinus: Maxillary sinus tenderness and frontal sinus tenderness present.     Left Sinus: Maxillary sinus tenderness and frontal sinus tenderness present.     Mouth/Throat:     Mouth: Mucous membranes are moist.     Pharynx: Oropharynx is clear.  Eyes:     Extraocular Movements: Extraocular movements intact.     Conjunctiva/sclera: Conjunctivae normal.     Pupils: Pupils are equal, round, and reactive to light.  Neck:     Musculoskeletal: Normal range of motion and neck supple.  Pulmonary:     Effort: Pulmonary effort is normal. No accessory muscle usage, prolonged expiration or respiratory distress.  Neurological:     Mental Status: She is alert and oriented to person, place, and time.     Coordination: Coordination is intact.     Gait: Gait is intact.      UC Treatments / Results  Labs (all labs ordered are listed, but only abnormal results are displayed) Labs Reviewed - No data to display  EKG   Radiology No results found.  Procedures Procedures (including critical care time)  Medications Ordered in UC Medications - No data to display  Initial Impression /  Assessment and Plan / UC Course  I have reviewed the triage vital signs and the nursing notes.  Pertinent labs & imaging results that were available during my care of the patient  were reviewed by me and considered in my medical decision making (see chart for details).    Will try prednisone and Flonase for possible sinus pressure causing symptoms.  Push fluids.  Otherwise, patient to follow-up with PCP for further evaluation and management of frequent headaches.  Return precautions given.  Final Clinical Impressions(s) / UC Diagnoses   Final diagnoses:  Frontal headache   ED Prescriptions    Medication Sig Dispense Auth. Provider   predniSONE (DELTASONE) 50 MG tablet Take 1 tablet (50 mg total) by mouth daily with breakfast. 5 tablet Gussie Towson V, PA-C   fluticasone (FLONASE) 50 MCG/ACT nasal spray Place 2 sprays into both nostrils daily. 1 g Tobin Chad, [place] 02/07/19 1352

## 2019-02-07 NOTE — Discharge Instructions (Signed)
Start prednisone as directed. Flonase as directed. Keep hydrated, urine should be clear to pale yellow in color. Follow up with PCP if symptoms still not improving.

## 2019-02-07 NOTE — ED Triage Notes (Signed)
Pt presents with complaints of headache x 2 weeks that is across the front of her head and is pressure like in nature. Denies relief with otc medication. Reports hx of frequent headaches.

## 2019-02-16 ENCOUNTER — Ambulatory Visit (INDEPENDENT_AMBULATORY_CARE_PROVIDER_SITE_OTHER): Payer: BC Managed Care – PPO

## 2019-02-16 ENCOUNTER — Other Ambulatory Visit: Payer: Self-pay

## 2019-02-16 VITALS — BP 136/78 | HR 74 | Temp 97.6°F | Ht 68.0 in | Wt 258.0 lb

## 2019-02-16 DIAGNOSIS — Z3042 Encounter for surveillance of injectable contraceptive: Secondary | ICD-10-CM | POA: Diagnosis not present

## 2019-02-16 MED ORDER — MEDROXYPROGESTERONE ACETATE 150 MG/ML IM SUSP
150.0000 mg | Freq: Once | INTRAMUSCULAR | Status: AC
  Administered 2019-02-16: 150 mg via INTRAMUSCULAR

## 2019-02-16 NOTE — Progress Notes (Signed)
Patient is here for Depo Provera Injection Patient is within Depo Provera Calender Limits yes last given 12/01/18  Next Depo Due between: Oct 1 - 15  Last AEX: 03/10/18 AEX Scheduled: 03/17/19  Patient is aware when next depo is due  Pt tolerated Injection well RUOQ  Routed to provider for review, encounter closed.

## 2019-03-17 ENCOUNTER — Other Ambulatory Visit: Payer: Self-pay

## 2019-03-17 ENCOUNTER — Ambulatory Visit: Payer: BC Managed Care – PPO | Admitting: Certified Nurse Midwife

## 2019-04-17 ENCOUNTER — Ambulatory Visit: Payer: Self-pay | Admitting: Certified Nurse Midwife

## 2019-04-19 ENCOUNTER — Other Ambulatory Visit: Payer: Self-pay

## 2019-04-19 ENCOUNTER — Ambulatory Visit
Admission: EM | Admit: 2019-04-19 | Discharge: 2019-04-19 | Disposition: A | Discharge status: Home / Self Care | Payer: BC Managed Care – PPO | Attending: Physician Assistant | Admitting: Physician Assistant

## 2019-04-19 DIAGNOSIS — R0789 Other chest pain: Secondary | ICD-10-CM

## 2019-04-19 MED ORDER — MELOXICAM 7.5 MG PO TABS: 7.5000 mg | ORAL_TABLET | Freq: Every day | ORAL | 0 refills | Status: DC

## 2019-04-19 MED ORDER — METHOCARBAMOL 500 MG PO TABS: 500.0000 mg | ORAL_TABLET | Freq: Two times a day (BID) | ORAL | 0 refills | Status: DC

## 2019-04-19 NOTE — ED Provider Notes (Signed)
[name]    CSN: [id number] Arrival date & time: 04/19/19  1233      History   Chief Complaint Chief Complaint  Patient presents with  . Appointment    1230  . chest muscle pain    HPI Frances Murphy is a 28 y.o. female.   28 year old female comes in for 2-day history of chest tightness, pain with exhalation.  Pain is to the bilateral chest, intermittent, worse with palpation and movement.  Denies shortness of breath, palpitation, weakness, dizziness.  Denies exertional chest pain, exertional fatigue, syncope.  States was lifting heavy boxes a few days ago prior to symptom onset.  Denies personal or family history of heart disease.  Has not taken anything for the symptoms.     Past Medical History:  Diagnosis Date  . Asthma   . Migraines    with aura  . STD (sexually transmitted disease)    chlamydia    Patient Active Problem List   Diagnosis Date Noted  . Preterm delivery, delivered 04/13/2013  . Preterm premature rupture of membranes 02/01/2013  . Premature labor 01/26/2013  . 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 Asthma 11/02/2012  . Obesity 11/02/2012  . Family history of breast cancer in mother 11/02/2012  . Current smoker 11/02/2012    Past Surgical History:  Procedure Laterality Date  . None      OB History    Gravida  1   Para  1   Term  0   Preterm  1   AB  0   Living  0     SAB  0   TAB  0   Ectopic  0   Multiple  0   Live Births  1            Home Medications    Prior to Admission medications   Medication Sig Start Date End Date Taking? Authorizing Provider  aspirin-acetaminophen-caffeine (EXCEDRIN MIGRAINE) [phone] MG tablet Take 1 tablet every 6 (six) hours as needed by mouth. 06/11/17  Yes Ward, Chase PicketJaime Pilcher, PA-C  albuterol (PROVENTIL HFA;VENTOLIN HFA) 108 (90 Base) MCG/ACT inhaler Inhale 2 puffs into the lungs every 4 (four) hours as needed for shortness of breath. 07/17/16   Mesner, Barbara CowerJason,  MD  ibuprofen (ADVIL,MOTRIN) 600 MG tablet Take 1 tablet (600 mg total) by mouth every 6 (six) hours as needed. 09/13/17   Derwood KaplanNanavati, Ankit, MD  meloxicam (MOBIC) 7.5 MG tablet Take 1 tablet (7.5 mg total) by mouth daily. 04/19/19   Cathie HoopsYu, Raun Routh V, PA-C  methocarbamol (ROBAXIN) 500 MG tablet Take 1 tablet (500 mg total) by mouth 2 (two) times daily. 04/19/19   Cathie HoopsYu, Casilda Pickerill V, PA-C  fluticasone (FLONASE) 50 MCG/ACT nasal spray Place 2 sprays into both nostrils daily. 02/07/19 04/19/19  Belinda FisherYu, Federico Maiorino V, PA-C    Family History Family History  Problem Relation Age of Onset  . Hypertension Mother   . Breast cancer Mother   . Hypertension Father   . Hypertension Sister   . Hypertension Maternal Grandmother   . Cancer Paternal Grandmother   . Cancer Paternal Grandfather     Social History Social History   Tobacco Use  . Smoking status: Current Every Day Smoker    Packs/day: 0.50    Years: 7.00    Pack years: 3.50    Types: Cigarettes  . Smokeless tobacco: Never Used  Substance Use Topics  . Alcohol use: No  . Drug use: No  Allergies   Penicillins   Review of Systems Review of Systems  Reason unable to perform ROS: See HPI as above.     Physical Exam Triage Vital Signs ED Triage Vitals  Enc Vitals Group     BP 04/19/19 1254 122/86     Pulse Rate 04/19/19 1254 85     Resp 04/19/19 1254 16     Temp 04/19/19 1254 98.8 F (37.1 C)     Temp Source 04/19/19 1254 Oral     SpO2 04/19/19 1254 97 %     Weight --      Height --      Head Circumference --      Peak Flow --      Pain Score 04/19/19 1257 9     Pain Loc --      Pain Edu? --      Excl. in [place]? --    No data found.  Updated Vital Signs BP 122/86 (BP Location: Left Arm)   Pulse 85   Temp 98.8 F (37.1 C) (Oral)   Resp 16   SpO2 97%   Physical Exam Constitutional:      General: She is not in acute distress.    Appearance: Normal appearance. She is not ill-appearing, toxic-appearing or diaphoretic.  HENT:     Head:  Normocephalic and atraumatic.     Mouth/Throat:     Mouth: Mucous membranes are moist.     Pharynx: Oropharynx is clear. Uvula midline.  Neck:     Musculoskeletal: Normal range of motion and neck supple.  Cardiovascular:     Rate and Rhythm: Normal rate and regular rhythm.     Heart sounds: Normal heart sounds. No murmur. No friction rub. No gallop.   Pulmonary:     Effort: Pulmonary effort is normal. No accessory muscle usage, prolonged expiration, respiratory distress or retractions.     Comments: Lungs clear to auscultation without adventitious lung sounds. Chest:     Comments: No swelling, mass, erythema, or rash.  Diffuse tenderness to palpation of the chest, right > left. Neurological:     General: No focal deficit present.     Mental Status: She is alert and oriented to person, place, and time.      UC Treatments / Results  Labs (all labs ordered are listed, but only abnormal results are displayed) Labs Reviewed - No data to display  EKG   Radiology No results found.  Procedures Procedures (including critical care time)  Medications Ordered in UC Medications - No data to display  Initial Impression / Assessment and Plan / UC Course  I have reviewed the triage vital signs and the nursing notes.  Pertinent labs & imaging results that were available during my care of the patient were reviewed by me and considered in my medical decision making (see chart for details).    Chest wall tenderness to palpation.  Start Mobic, muscle relaxant as needed.  Return precautions given.  Patient expresses understanding and agrees to plan.  Final Clinical Impressions(s) / UC Diagnoses   Final diagnoses:  Chest wall pain    ED Prescriptions    Medication Sig Dispense Auth. Provider   meloxicam (MOBIC) 7.5 MG tablet Take 1 tablet (7.5 mg total) by mouth daily. 15 tablet Lankford Gutzmer V, PA-C   methocarbamol (ROBAXIN) 500 MG tablet Take 1 tablet (500 mg total) by mouth 2 (two) times  daily. 20 tablet Ok Edwards, PA-C  Belinda Fisher, PA-C 04/19/19 1435

## 2019-04-19 NOTE — Discharge Instructions (Signed)
Start Mobic. Do not take ibuprofen (motrin/advil)/ naproxen (aleve) while on mobic. Robaxin as needed, this can make you drowsy, so do not take if you are going to drive, operate heavy machinery, or make important decisions. Ice/heat compresses as needed. Follow up  with PCP if symptoms worsen, changes for reevaluation. If experiencing chest pain, shortness of breath, nausea/vomiting, dizziness, passing out, go to the ED for further evaluation needed.

## 2019-04-19 NOTE — ED Triage Notes (Signed)
Pt presents to UC w/ c/o chest tightness and pain when exhaling since yesterday. Pt reports lifting heavy boxes on Monday. Pt denies taking any OTC meds for pain relief.

## 2019-05-04 ENCOUNTER — Ambulatory Visit: Payer: BC Managed Care – PPO

## 2019-05-04 NOTE — Progress Notes (Deleted)
Patient is here for Depo Provera Injection Patient is within Depo Provera Calender Limits yes, 10/1-10/15 Next Depo Due between: 12/17-12/31 Last AEX: 03/10/18 DL AEX Scheduled: needs to schedule  Patient is aware when next depo is due  Pt tolerated Injection well in [place].  Routed to provider for review, encounter closed.

## 2019-05-17 ENCOUNTER — Telehealth: Payer: Self-pay | Admitting: Certified Nurse Midwife

## 2019-05-17 NOTE — Telephone Encounter (Signed)
Patient has birth control questions.

## 2019-05-17 NOTE — Telephone Encounter (Signed)
Spoke with patient. Patient is due for depo-provera, last injection received 7/16/2, due 10/1-10/15.   Last AEX 03/10/18. Patient has a new insurance plan effective 05/18/19, requesting to schedule AEX and depo-provera injection.   AEX scheduled for 05/22/19 at 9am with Melvia Heaps, CNM. Advised I will review depo with Melvia Heaps, CNM and return call. Patient agreeable.  Melvia Heaps, CNM -ok to proceed with depo provera injection?

## 2019-05-17 NOTE — Telephone Encounter (Signed)
Call reviewed with Melvia Heaps, CNM, call returned to patient. Ok to proceed with depo provera, will need to update AEX for future refills of depo. Patient agreeable and request to schedule nurse visit. Nurse visit scheduled for 05/18/19 at 3:45pm. Covid19 prescreen negative, precautions reviewed.   Routing to provider for final review. Patient is agreeable to disposition. Will close encounter.

## 2019-05-18 ENCOUNTER — Ambulatory Visit: Payer: No Typology Code available for payment source

## 2019-05-18 ENCOUNTER — Other Ambulatory Visit: Payer: Self-pay

## 2019-05-18 NOTE — Progress Notes (Deleted)
Patient is here for Depo Provera Injection Patient is within Depo Provera Calender Limits yes, 05/04/2019 - 05/18/2019 Next Depo Due between: 08/03/2019 - 08/17/2019 Last AEX: 03/10/2018 DL AEX Scheduled: 05/22/2019  Patient is aware when next depo is due.  Pt tolerated Injection well.  Routed to provider for review, encounter closed.

## 2019-05-22 ENCOUNTER — Ambulatory Visit: Payer: Self-pay | Admitting: Certified Nurse Midwife

## 2019-05-29 ENCOUNTER — Ambulatory Visit (INDEPENDENT_AMBULATORY_CARE_PROVIDER_SITE_OTHER): Payer: No Typology Code available for payment source | Admitting: Certified Nurse Midwife

## 2019-05-29 ENCOUNTER — Other Ambulatory Visit: Payer: Self-pay

## 2019-05-29 ENCOUNTER — Encounter: Payer: Self-pay | Admitting: Certified Nurse Midwife

## 2019-05-29 VITALS — BP 120/80 | HR 70 | Temp 97.2°F | Resp 16 | Ht 67.5 in | Wt 269.0 lb

## 2019-05-29 DIAGNOSIS — Z113 Encounter for screening for infections with a predominantly sexual mode of transmission: Secondary | ICD-10-CM

## 2019-05-29 DIAGNOSIS — R635 Abnormal weight gain: Secondary | ICD-10-CM

## 2019-05-29 DIAGNOSIS — Z01419 Encounter for gynecological examination (general) (routine) without abnormal findings: Secondary | ICD-10-CM | POA: Diagnosis not present

## 2019-05-29 DIAGNOSIS — Z3042 Encounter for surveillance of injectable contraceptive: Secondary | ICD-10-CM

## 2019-05-29 LAB — POCT URINE PREGNANCY: Preg Test, Ur: NEGATIVE

## 2019-05-29 NOTE — Patient Instructions (Signed)
General topics  Next pap or exam is  due in 1 year Take a Women's multivitamin Take 1200 mg. of calcium daily - prefer dietary If any concerns in interim to call back  Breast Self-Awareness Practicing breast self-awareness may pick up problems early, prevent significant medical complications, and possibly save your life. By practicing breast self-awareness, you can become familiar with how your breasts look and feel and if your breasts are changing. This allows you to notice changes early. It can also offer you some reassurance that your breast health is good. One way to learn what is normal for your breasts and whether your breasts are changing is to do a breast self-exam. If you find a lump or something that was not present in the past, it is best to contact your caregiver right away. Other findings that should be evaluated by your caregiver include nipple discharge, especially if it is bloody; skin changes or reddening; areas where the skin seems to be pulled in (retracted); or new lumps and bumps. Breast pain is seldom associated with cancer (malignancy), but should also be evaluated by a caregiver. BREAST SELF-EXAM The best time to examine your breasts is 5 7 days after your menstrual period is over.  ExitCare Patient Information 2013 [place].   Exercise to Stay Healthy Exercise helps you become and stay healthy. EXERCISE IDEAS AND TIPS Choose exercises that:  You enjoy.  Fit into your day. You do not need to exercise really hard to be healthy. You can do exercises at a slow or medium level and stay healthy. You can:  Stretch before and after working out.  Try yoga, Pilates, or tai chi.  Lift weights.  Walk fast, swim, jog, run, climb stairs, bicycle, dance, or rollerskate.  Take aerobic classes. Exercises that burn about 150 calories:  Running 1  miles in 15 minutes.  Playing volleyball for 45 to 60 minutes.  Washing and waxing a car for 45 to 60 minutes.   Playing touch football for 45 minutes.  Walking 1  miles in 35 minutes.  Pushing a stroller 1  miles in 30 minutes.  Playing basketball for 30 minutes.  Raking leaves for 30 minutes.  Bicycling 5 miles in 30 minutes.  Walking 2 miles in 30 minutes.  Dancing for 30 minutes.  Shoveling snow for 15 minutes.  Swimming laps for 20 minutes.  Walking up stairs for 15 minutes.  Bicycling 4 miles in 15 minutes.  Gardening for 30 to 45 minutes.  Jumping rope for 15 minutes.  Washing windows or floors for 45 to 60 minutes. Document Released: 08/22/2010 Document Revised: 10/12/2011 Document Reviewed: 08/22/2010 [facility] Patient Information 2013 [place].   Other topics ( that may be useful information):    Sexually Transmitted Disease Sexually transmitted disease (STD) refers to any infection that is passed from person to person during sexual activity. This may happen by way of saliva, semen, blood, vaginal mucus, or urine. Common STDs include:  Gonorrhea.  Chlamydia.  Syphilis.  HIV/AIDS.  Genital herpes.  Hepatitis B and C.  Trichomonas.  Human papillomavirus (HPV).  Pubic lice. CAUSES  An STD may be spread by bacteria, virus, or parasite. A person can get an STD by:  Sexual intercourse with an infected person.  Sharing sex toys with an infected person.  Sharing needles with an infected person.  Having intimate contact with the genitals, mouth, or rectal areas of an infected person. SYMPTOMS  Some people may not have any symptoms, but  they can still pass the infection to others. Different STDs have different symptoms. Symptoms include:  Painful or bloody urination.  Pain in the pelvis, abdomen, vagina, anus, throat, or eyes.  Skin rash, itching, irritation, growths, or sores (lesions). These usually occur in the genital or anal area.  Abnormal vaginal discharge.  Penile discharge in men.  Soft, flesh-colored skin growths in the genital  or anal area.  Fever.  Pain or bleeding during sexual intercourse.  Swollen glands in the groin area.  Yellow skin and eyes (jaundice). This is seen with hepatitis. DIAGNOSIS  To make a diagnosis, your caregiver may:  Take a medical history.  Perform a physical exam.  Take a specimen (culture) to be examined.  Examine a sample of discharge under a microscope.  Perform blood test TREATMENT   Chlamydia, gonorrhea, trichomonas, and syphilis can be cured with antibiotic medicine.  Genital herpes, hepatitis, and HIV can be treated, but not cured, with prescribed medicines. The medicines will lessen the symptoms.  Genital warts from HPV can be treated with medicine or by freezing, burning (electrocautery), or surgery. Warts may come back.  HPV is a virus and cannot be cured with medicine or surgery.However, abnormal areas may be followed very closely by your caregiver and may be removed from the cervix, vagina, or vulva through office procedures or surgery. If your diagnosis is confirmed, your recent sexual partners need treatment. This is true even if they are symptom-free or have a negative culture or evaluation. They should not have sex until their caregiver says it is okay. HOME CARE INSTRUCTIONS  All sexual partners should be informed, tested, and treated for all STDs.  Take your antibiotics as directed. Finish them even if you start to feel better.  Only take over-the-counter or prescription medicines for pain, discomfort, or fever as directed by your caregiver.  Rest.  Eat a balanced diet and drink enough fluids to keep your urine clear or pale yellow.  Do not have sex until treatment is completed and you have followed up with your caregiver. STDs should be checked after treatment.  Keep all follow-up appointments, Pap tests, and blood tests as directed by your caregiver.  Only use latex condoms and water-soluble lubricants during sexual activity. Do not use petroleum  jelly or oils.  Avoid alcohol and illegal drugs.  Get vaccinated for HPV and hepatitis. If you have not received these vaccines in the past, talk to your caregiver about whether one or both might be right for you.  Avoid risky sex practices that can break the skin. The only way to avoid getting an STD is to avoid all sexual activity.Latex condoms and dental dams (for oral sex) will help lessen the risk of getting an STD, but will not completely eliminate the risk. SEEK MEDICAL CARE IF:   You have a fever.  You have any new or worsening symptoms. Document Released: 10/10/2002 Document Revised: 10/12/2011 Document Reviewed: 10/17/2010 [facility] Patient Information 2013 [place].    Domestic Abuse You are being battered or abused if someone close to you hits, pushes, or physically hurts you in any way. You also are being abused if you are forced into activities. You are being sexually abused if you are forced to have sexual contact of any kind. You are being emotionally abused if you are made to feel worthless or if you are constantly threatened. It is important to remember that help is available. No one has the right to abuse you. PREVENTION OF FURTHER  ABUSE  Learn the warning signs of danger. This varies with situations but may include: the use of alcohol, threats, isolation from friends and family, or forced sexual contact. Leave if you feel that violence is going to occur.  If you are attacked or beaten, report it to the police so the abuse is documented. You do not have to press charges. The police can protect you while you or the attackers are leaving. Get the officer's name and badge number and a copy of the report.  Find someone you can trust and tell them what is happening to you: your caregiver, a nurse, clergy member, close friend or family member. Feeling ashamed is natural, but remember that you have done nothing wrong. No one deserves abuse. Document Released: 07/17/2000  Document Revised: 10/12/2011 Document Reviewed: 09/25/2010 [facility] Patient Information 2013 [place].    How Much is Too Much Alcohol? Drinking too much alcohol can cause injury, accidents, and health problems. These types of problems can include:   Car crashes.  Falls.  Family fighting (domestic violence).  Drowning.  Fights.  Injuries.  Burns.  Damage to certain organs.  Having a baby with birth defects. ONE DRINK CAN BE TOO MUCH WHEN YOU ARE:  Working.  Pregnant or breastfeeding.  Taking medicines. Ask your doctor.  Driving or planning to drive. If you or someone you know has a drinking problem, get help from a doctor.  Document Released: 05/16/2009 Document Revised: 10/12/2011 Document Reviewed: 05/16/2009 [facility] Patient Information 2013 [place].   Smoking Hazards Smoking cigarettes is extremely bad for your health. Tobacco smoke has over 200 known poisons in it. There are over 60 chemicals in tobacco smoke that cause cancer. Some of the chemicals found in cigarette smoke include:   Cyanide.  Benzene.  Formaldehyde.  Methanol (wood alcohol).  Acetylene (fuel used in welding torches).  Ammonia. Cigarette smoke also contains the poisonous gases nitrogen oxide and carbon monoxide.  Cigarette smokers have an increased risk of many serious medical problems and Smoking causes approximately:  90% of all lung cancer deaths in men.  80% of all lung cancer deaths in women.  90% of deaths from chronic obstructive lung disease. Compared with nonsmokers, smoking increases the risk of:  Coronary heart disease by 2 to 4 times.  Stroke by 2 to 4 times.  Men developing lung cancer by 23 times.  Women developing lung cancer by 13 times.  Dying from chronic obstructive lung diseases by 12 times.  . Smoking is the most preventable cause of death and disease in our society.  WHY IS SMOKING ADDICTIVE?  Nicotine is the chemical agent in  tobacco that is capable of causing addiction or dependence.  When you smoke and inhale, nicotine is absorbed rapidly into the bloodstream through your lungs. Nicotine absorbed through the lungs is capable of creating a powerful addiction. Both inhaled and non-inhaled nicotine may be addictive.  Addiction studies of cigarettes and spit tobacco show that addiction to nicotine occurs mainly during the teen years, when young people begin using tobacco products. WHAT ARE THE BENEFITS OF QUITTING?  There are many health benefits to quitting smoking.   Likelihood of developing cancer and heart disease decreases. Health improvements are seen almost immediately.  Blood pressure, pulse rate, and breathing patterns start returning to normal soon after quitting. QUITTING SMOKING   American Lung Association - 1-800-LUNGUSA  American Cancer Society - 1-800-ACS-2345 Document Released: 08/27/2004 Document Revised: 10/12/2011 Document Reviewed: 05/01/2009 [facility] Patient Information 2013 [place].   Stress Management Stress is a state of physical or mental tension that often results from changes in your life or normal routine. Some common causes of stress are:  Death of a loved one.  Injuries or severe illnesses.  Getting fired or changing jobs.  Moving into a new home. Other causes may be:  Sexual problems.  Business or financial losses.  Taking on a large debt.  Regular conflict with someone at home or at work.  Constant tiredness from lack of sleep. It is not just bad things that are stressful. It may be stressful to:  Win the lottery.  Get married.  Buy a new car. The amount of stress that can be easily tolerated varies from person to person. Changes generally cause stress, regardless of the types of change. Too much stress can affect your health. It may lead to physical or emotional problems. Too little stress (boredom) may also become stressful. SUGGESTIONS TO REDUCE  STRESS:  Talk things over with your family and friends. It often is helpful to share your concerns and worries. If you feel your problem is serious, you may want to get help from a professional counselor.  Consider your problems one at a time instead of lumping them all together. Trying to take care of everything at once may seem impossible. List all the things you need to do and then start with the most important one. Set a goal to accomplish 2 or 3 things each day. If you expect to do too many in a single day you will naturally fail, causing you to feel even more stressed.  Do not use alcohol or drugs to relieve stress. Although you may feel better for a short time, they do not remove the problems that caused the stress. They can also be habit forming.  Exercise regularly - at least 3 times per week. Physical exercise can help to relieve that "uptight" feeling and will relax you.  The shortest distance between despair and hope is often a good night's sleep.  Go to bed and get up on time allowing yourself time for appointments without being rushed.  Take a short "time-out" period from any stressful situation that occurs during the day. Close your eyes and take some deep breaths. Starting with the muscles in your face, tense them, hold it for a few seconds, then relax. Repeat this with the muscles in your neck, shoulders, hand, stomach, back and legs.  Take good care of yourself. Eat a balanced diet and get plenty of rest.  Schedule time for having fun. Take a break from your daily routine to relax. HOME CARE INSTRUCTIONS   Call if you feel overwhelmed by your problems and feel you can no longer manage them on your own.  Return immediately if you feel like hurting yourself or someone else. Document Released: 01/13/2001 Document Revised: 10/12/2011 Document Reviewed: 09/05/2007 [facility] Patient Information 2013 [place].  Come in at two weeks for pregnancy test and Depo Provera. No  sexual activity.

## 2019-05-29 NOTE — Progress Notes (Signed)
28 y.o. G1P0100 Single  African American Fe here for annual exam. Periods none with Depo Provera use. She is outside window for injection today and is aware. Not sexually since one month ago, but desires STD screening. Aware she has gained weight and now has stopped sodas and working more with job.Migraine headaches unchanged. Still smoking, no desire to stop at this point. No other health issues today.  No LMP recorded. (Menstrual status: Other).          Sexually active: No.  The current method of family planning is abstinence.    Exercising: No.  exercise Smoker:  no  Review of Systems  Constitutional: Negative.   HENT: Negative.   Eyes: Negative.   Respiratory: Negative.   Cardiovascular: Negative.   Gastrointestinal: Negative.   Genitourinary: Negative.   Musculoskeletal: Negative.   Skin: Negative.   Neurological: Negative.   Endo/Heme/Allergies: Negative.   Psychiatric/Behavioral: Negative.     Health Maintenance: Pap:  03-10-18 neg History of Abnormal Pap: no MMG:  none Self Breast exams: yes Colonoscopy:  none BMD:   none TDaP:  Within 32yrs Shingles: no Pneumonia: no Hep C and HIV: both neg 2019 Labs: upt-neg   reports that she has been smoking cigarettes. She has a 3.50 pack-year smoking history. She has never used smokeless tobacco. She reports that she does not drink alcohol or use drugs.  Past Medical History:  Diagnosis Date  . Asthma   . Migraines    with aura  . STD (sexually transmitted disease)    chlamydia    Past Surgical History:  Procedure Laterality Date  . None      Current Outpatient Medications  Medication Sig Dispense Refill  . albuterol (PROVENTIL HFA;VENTOLIN HFA) 108 (90 Base) MCG/ACT inhaler Inhale 2 puffs into the lungs every 4 (four) hours as needed for shortness of breath. 1 Inhaler 0  . aspirin-acetaminophen-caffeine (EXCEDRIN MIGRAINE) 250-250-65 MG tablet Take 1 tablet every 6 (six) hours as needed by mouth. 30 tablet 0   No  current facility-administered medications for this visit.     Family History  Problem Relation Age of Onset  . Hypertension Mother   . Breast cancer Mother   . Hypertension Father   . Hypertension Sister   . Hypertension Maternal Grandmother   . Cancer Paternal Grandmother   . Cancer Paternal Grandfather     ROS:  Pertinent items are noted in HPI.  Otherwise, a comprehensive ROS was negative.  Exam:   BP 120/80   Pulse 70   Temp (!) 97.2 F (36.2 C) (Skin)   Resp 16   Ht 5' 7.5" (1.715 m)   Wt 269 lb (122 kg)   BMI 41.51 kg/m  Height: 5' 7.5" (171.5 cm) Ht Readings from Last 3 Encounters:  05/29/19 5' 7.5" (1.715 m)  02/16/19 5\' 8"  (1.727 m)  09/28/18 5\' 7"  (1.702 m)    General appearance: alert, cooperative and appears stated age Head: Normocephalic, without obvious abnormality, atraumatic Neck: no adenopathy, supple, symmetrical, trachea midline and thyroid normal to inspection and palpation Lungs: clear to auscultation bilaterally Breasts: normal appearance, no masses or tenderness, No nipple retraction or dimpling, No nipple discharge or bleeding, No axillary or supraclavicular adenopathy Heart: regular rate and rhythm Abdomen: soft, non-tender; no masses,  no organomegaly Extremities: extremities normal, atraumatic, no cyanosis or edema Skin: Skin color, texture, turgor normal. No rashes or lesions Lymph nodes: Cervical, supraclavicular, and axillary nodes normal. No abnormal inguinal nodes palpated Neurologic: Grossly normal  Pelvic: External genitalia:  no lesions              Urethra:  normal appearing urethra with no masses, tenderness or lesions              Bartholin's and Skene's: normal                 Vagina: normal appearing vagina with normal color and discharge, no lesions              Cervix: no cervical motion tenderness, no lesions and normal appearance              Pap taken: No. Bimanual Exam:  Uterus:  normal size, contour, position,  consistency, mobility, non-tender and anteverted              Adnexa: normal adnexa and no mass, fullness, tenderness               Rectovaginal: Confirms               Anus:  normal sphincter tone, no lesions  Chaperone present: yes  A:  Well Woman with normal exam  Contraception Depo Provera out of window to renew today. Desires restart.  Weight gain  Gardasil candidate  STD screening    P:   Reviewed health and wellness pertinent to exam  Risks/benefits/warning signs and bleeding expectations of Depo Provera given. Discussed will need to use consistent condoms or abstain for two weeks, return for UPT and if negative will restart Depo Provera. Patient voiced understanding.  Rx Depo Provera 150 mg IM every 3 months x 1 year  Given information on Gardasil, will decide if she wants to start series.  Labs: GC,Chlamydia, Affirm, declines serum screening  Pap smear: no  counseled on breast self exam, STD prevention, HIV risk factors and prevention, feminine hygiene, adequate intake of calcium and vitamin D, diet change for weight loss and increase in  Exercise to help with weight loss. Smoking cessation.  return annually or prn  An After Visit Summary was printed and given to the patient.

## 2019-05-30 LAB — VAGINITIS/VAGINOSIS, DNA PROBE
Candida Species: NEGATIVE
Gardnerella vaginalis: NEGATIVE
Trichomonas vaginosis: NEGATIVE

## 2019-05-30 LAB — GC/CHLAMYDIA PROBE AMP
Chlamydia trachomatis, NAA: POSITIVE — AB
Neisseria Gonorrhoeae by PCR: NEGATIVE

## 2019-05-31 ENCOUNTER — Other Ambulatory Visit: Payer: Self-pay | Admitting: *Deleted

## 2019-05-31 MED ORDER — AZITHROMYCIN 250 MG PO TABS: 1000.0000 mg | ORAL_TABLET | Freq: Once | ORAL | 0 refills | Status: AC

## 2019-06-12 ENCOUNTER — Other Ambulatory Visit: Payer: Self-pay

## 2019-06-12 ENCOUNTER — Ambulatory Visit (INDEPENDENT_AMBULATORY_CARE_PROVIDER_SITE_OTHER): Payer: No Typology Code available for payment source | Admitting: *Deleted

## 2019-06-12 VITALS — BP 136/90 | HR 96 | Temp 97.3°F | Resp 14 | Ht 67.0 in | Wt 269.0 lb

## 2019-06-12 DIAGNOSIS — Z3042 Encounter for surveillance of injectable contraceptive: Secondary | ICD-10-CM

## 2019-06-12 LAB — POCT URINE PREGNANCY: Preg Test, Ur: NEGATIVE

## 2019-06-12 MED ORDER — MEDROXYPROGESTERONE ACETATE 150 MG/ML IM SUSP
150.0000 mg | Freq: Once | INTRAMUSCULAR | Status: AC
  Administered 2019-06-12: 150 mg via INTRAMUSCULAR

## 2019-06-12 NOTE — Progress Notes (Signed)
Patient is here for Depo Provera Injection Patient is within Depo Provera Calender Limits no, patient states last intercourse was 04/19/2019, patient has been abstinent for greater than 2 weeks and UPT was negative today  Next Depo Due between: 1/25 to 2/8 Last AEX: 05-29-2019 DL  AEX Scheduled: 05-29-20  Patient is aware when next depo is due  Pt tolerated Injection well in [place].   Routed to provider for review, encounter closed.

## 2019-06-28 ENCOUNTER — Encounter: Payer: Self-pay | Admitting: Emergency Medicine

## 2019-06-28 ENCOUNTER — Ambulatory Visit
Admission: EM | Admit: 2019-06-28 | Discharge: 2019-06-28 | Disposition: A | Discharge status: Home / Self Care | Payer: Self-pay | Attending: Emergency Medicine | Admitting: Emergency Medicine

## 2019-06-28 ENCOUNTER — Other Ambulatory Visit: Payer: Self-pay

## 2019-06-28 DIAGNOSIS — R519 Headache, unspecified: Secondary | ICD-10-CM

## 2019-06-28 MED ORDER — KETOROLAC TROMETHAMINE 60 MG/2ML IM SOLN
60.0000 mg | Freq: Once | INTRAMUSCULAR | Status: AC
  Administered 2019-06-28: 14:00:00 60 mg via INTRAMUSCULAR

## 2019-06-28 MED ORDER — DEXAMETHASONE SODIUM PHOSPHATE 10 MG/ML IJ SOLN
10.0000 mg | Freq: Once | INTRAMUSCULAR | Status: AC
  Administered 2019-06-28: 10 mg via INTRAMUSCULAR

## 2019-06-28 MED ORDER — ONDANSETRON 4 MG PO TBDP
4.0000 mg | ORAL_TABLET | Freq: Once | ORAL | Status: AC
  Administered 2019-06-28: 14:00:00 4 mg via ORAL

## 2019-06-28 NOTE — ED Notes (Signed)
Patient able to ambulate independently  

## 2019-06-28 NOTE — ED Provider Notes (Signed)
[name]    CSN: [id number] Arrival date & time: 06/28/19  1337      History   Chief Complaint Chief Complaint  Patient presents with  . APPT: 130  . Headache    HPI Frances Murphy is a 28 y.o. female with history of asthma, migraines presenting for severe frontal headache since early this morning.  Patient states that she woke up with her headache.  Denies worst headache of life, sudden onset, thunderclap headache, change in vision.  Patient has been seen previously in this office for similar concern: Please review 02/07/2019 visit note which was reviewed me at time of point.  Patient has taken Toradol, Solu-Medrol in the past with out adverse effect and complete resolution of symptoms.  Patient is not followed by neurology: States these headaches are infrequent.  Last headache was early to mid October.  Patient denies nausea, vomiting, abdominal pain, chest pain, difficulty breathing.  Of note patient had pregnancy test last week: Negative x2.   Past Medical History:  Diagnosis Date  . Asthma   . Migraines    with aura  . STD (sexually transmitted disease)    chlamydia    Patient Active Problem List   Diagnosis Date Noted  . Preterm delivery, delivered 04/13/2013  . Preterm premature rupture of membranes 02/01/2013  . Premature labor 01/26/2013  . 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 Asthma 11/02/2012  . Obesity 11/02/2012  . Family history of breast cancer in mother 11/02/2012  . Current smoker 11/02/2012    Past Surgical History:  Procedure Laterality Date  . None      OB History    Gravida  1   Para  1   Term  0   Preterm  1   AB  0   Living  0     SAB  0   TAB  0   Ectopic  0   Multiple  0   Live Births  1            Home Medications    Prior to Admission medications   Medication Sig Start Date End Date Taking? Authorizing Provider  aspirin-acetaminophen-caffeine (EXCEDRIN MIGRAINE) [phone] MG tablet Take  1 tablet every 6 (six) hours as needed by mouth. 06/11/17  Yes Ward, Ozella Almond, PA-C  albuterol (PROVENTIL HFA;VENTOLIN HFA) 108 (90 Base) MCG/ACT inhaler Inhale 2 puffs into the lungs every 4 (four) hours as needed for shortness of breath. 07/17/16   Mesner, Corene Cornea, MD  fluticasone (FLONASE) 50 MCG/ACT nasal spray Place 2 sprays into both nostrils daily. 02/07/19 04/19/19  Ok Edwards, PA-C    Family History Family History  Problem Relation Age of Onset  . Hypertension Mother   . Breast cancer Mother   . Hypertension Father   . Hypertension Sister   . Hypertension Maternal Grandmother   . Cancer Paternal Grandmother   . Cancer Paternal Grandfather     Social History Social History   Tobacco Use  . Smoking status: Current Every Day Smoker    Packs/day: 0.25    Years: 7.00    Pack years: 1.75    Types: Cigarettes  . Smokeless tobacco: Never Used  Substance Use Topics  . Alcohol use: No  . Drug use: No     Allergies   Penicillins   Review of Systems Review of Systems  Constitutional: Negative for fatigue and fever.  HENT: Negative for ear pain, sinus pain, sore throat and voice change.  Eyes: Negative for pain, redness and visual disturbance.  Respiratory: Negative for cough and shortness of breath.   Cardiovascular: Negative for chest pain and palpitations.  Gastrointestinal: Negative for abdominal pain, diarrhea and vomiting.  Musculoskeletal: Negative for arthralgias and myalgias.  Skin: Negative for rash and wound.  Neurological: Positive for headaches. Negative for dizziness, syncope, facial asymmetry and speech difficulty.       Denies aura     Physical Exam Triage Vital Signs ED Triage Vitals  Enc Vitals Group     BP 06/28/19 1350 (!) 132/91     Pulse Rate 06/28/19 1350 98     Resp 06/28/19 1350 18     Temp 06/28/19 1350 97.8 F (36.6 C)     Temp Source 06/28/19 1350 Temporal     SpO2 06/28/19 1350 96 %     Weight --      Height --      Head  Circumference --      Peak Flow --      Pain Score 06/28/19 1351 6     Pain Loc --      Pain Edu? --      Excl. in GC? --    No data found.  Updated Vital Signs BP (!) 132/91 (BP Location: Left Arm)   Pulse 98   Temp 97.8 F (36.6 C) (Temporal)   Resp 18   SpO2 96%   Visual Acuity Right Eye Distance:   Left Eye Distance:   Bilateral Distance:    Right Eye Near:   Left Eye Near:    Bilateral Near:     Physical Exam Constitutional:      General: She is not in acute distress.    Appearance: She is well-developed. She is not ill-appearing.  HENT:     Head: Normocephalic and atraumatic.     Mouth/Throat:     Pharynx: Oropharynx is clear.  Eyes:     General: No scleral icterus.    Extraocular Movements: Extraocular movements intact.     Right eye: Normal extraocular motion and no nystagmus.     Left eye: Normal extraocular motion and no nystagmus.     Pupils: Pupils are equal, round, and reactive to light.  Neck:     Musculoskeletal: Normal range of motion and neck supple. No neck rigidity.  Cardiovascular:     Rate and Rhythm: Normal rate and regular rhythm.  Pulmonary:     Effort: Pulmonary effort is normal. No respiratory distress.     Breath sounds: No wheezing.  Skin:    General: Skin is warm.     Capillary Refill: Capillary refill takes less than 2 seconds.     Coloration: Skin is not jaundiced or pale.     Findings: No rash.  Neurological:     Mental Status: She is alert and oriented to person, place, and time.     Cranial Nerves: No cranial nerve deficit, dysarthria or facial asymmetry.     Sensory: No sensory deficit.     Gait: Gait normal.     Deep Tendon Reflexes: Reflexes normal.  Psychiatric:        Mood and Affect: Mood normal.        Speech: Speech normal.        Behavior: Behavior normal.      UC Treatments / Results  Labs (all labs ordered are listed, but only abnormal results are displayed) Labs Reviewed - No data to display  EKG  Radiology No results found.  Procedures Procedures (including critical care time)  Medications Ordered in UC Medications  ketorolac (TORADOL) injection 60 mg (60 mg Intramuscular Given 06/28/19 1410)  dexamethasone (DECADRON) injection 10 mg (10 mg Intramuscular Given 06/28/19 1410)  ondansetron (ZOFRAN-ODT) disintegrating tablet 4 mg (4 mg Oral Given 06/28/19 1409)    Initial Impression / Assessment and Plan / UC Course  I have reviewed the triage vital signs and the nursing notes.  Pertinent labs & imaging results that were available during my care of the patient were reviewed by me and considered in my medical decision making (see chart for details).     Patient afebrile, nontoxic, and without neurocognitive deficit.  No significant change in symptomatology from patient's baseline of severe headaches.  Patient given IM Decadron, Toradol as well as Zofran ODT in office which she tolerated well.  Patient reported improvement in symptoms at time of discharge.  Patient to keep headache log, follow-up with PCP.  Return precautions discussed, patient verbalized understanding and is agreeable to plan. Final Clinical Impressions(s) / UC Diagnoses   Final diagnoses:  Acute intractable headache, unspecified headache type     Discharge Instructions     Important keep track of how often you any headaches. Schedule PCP appointment establish care as you may require further evaluation for cause of headaches. Go to ER for worsening of life, thunderclap headache, vomiting, change in vision.    ED Prescriptions    None     PDMP not reviewed this encounter.   Hall-Potvin, [place], [place] 06/28/19 1745

## 2019-06-28 NOTE — ED Triage Notes (Signed)
Pt presents to [facility] for assessment of headache that woke her up from sleep around 430am.  Pt states it has improved some since the morning, but still having headache.  Pt tried excedrin today around 530am.

## 2019-06-28 NOTE — Discharge Instructions (Signed)
Important keep track of how often you any headaches. Schedule PCP appointment establish care as you may require further evaluation for cause of headaches. Go to ER for worsening of life, thunderclap headache, vomiting, change in vision.

## 2019-07-20 ENCOUNTER — Ambulatory Visit
Admission: EM | Admit: 2019-07-20 | Discharge: 2019-07-20 | Disposition: A | Discharge status: Home / Self Care | Payer: No Typology Code available for payment source | Attending: Physician Assistant | Admitting: Physician Assistant

## 2019-07-20 ENCOUNTER — Encounter: Payer: Self-pay | Admitting: Emergency Medicine

## 2019-07-20 ENCOUNTER — Other Ambulatory Visit: Payer: Self-pay

## 2019-07-20 DIAGNOSIS — R0789 Other chest pain: Secondary | ICD-10-CM

## 2019-07-20 MED ORDER — DICLOFENAC SODIUM 50 MG PO TBEC: 50.0000 mg | DELAYED_RELEASE_TABLET | Freq: Two times a day (BID) | ORAL | 0 refills | Status: DC

## 2019-07-20 NOTE — ED Triage Notes (Addendum)
Pt presents to [facility] for assessment after doing heavy lifting yesterday and states last night she began to have upper mid chest pain.  Pt states she had an episode this morning after getting up of "white lights sparkling"/light-headedness.  Denies any now.  Denies shortness of breath, n/v.  States when she moves she has some leg pains since yesterday as well, but denies arm, neck or back radiation.  Pt states she has not tried anything at home for her pain.  Educated her on the use of ibuprofen for musculoskeletal pain.

## 2019-07-20 NOTE — Discharge Instructions (Signed)
Start Diclofenac. Do not take ibuprofen (motrin/advil)/ naproxen (aleve) while on diclofenac. Ice compress to mid chest. Rest with limited heavy lifting for now. This can take a few weeks to completely resolve, but should be feeling better each week. Follow up with PCP for further evaluation if symptoms not improving. If developing worsening chest pain, shortness of breath, nausea/vomiting, sweating, go to the ED for further evaluation needed.

## 2019-07-20 NOTE — ED Provider Notes (Signed)
[name]    CSN: [id number] Arrival date & time: 07/20/19  0855      History   Chief Complaint Chief Complaint  Patient presents with  . Chest Pain    HPI Frances Murphy is a 28 y.o. female.   28 year old female comes in for 2 day history of chest wall pain. States she did heavy lifting yesterday, and over exerted. She had mid upper chest soreness afterwards. States she had one episode this morning while getting up where she had "white lights sparkling"/lightheadedness. This resolved and has not returned. Denies shortness of breath, nausea/vomiting. Denies exertional chest pain, exertional fatigue, syncope. Denies personal or family history of heart disease. Has not taken anything for the symptoms.      Past Medical History:  Diagnosis Date  . Asthma   . Migraines    with aura  . STD (sexually transmitted disease)    chlamydia    Patient Active Problem List   Diagnosis Date Noted  . Preterm delivery, delivered 04/13/2013  . Preterm premature rupture of membranes 02/01/2013  . Premature labor 01/26/2013  . 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 Asthma 11/02/2012  . Obesity 11/02/2012  . Family history of breast cancer in mother 11/02/2012  . Current smoker 11/02/2012    Past Surgical History:  Procedure Laterality Date  . None      OB History    Gravida  1   Para  1   Term  0   Preterm  1   AB  0   Living  0     SAB  0   TAB  0   Ectopic  0   Multiple  0   Live Births  1            Home Medications    Prior to Admission medications   Medication Sig Start Date End Date Taking? Authorizing Provider  aspirin-acetaminophen-caffeine (EXCEDRIN MIGRAINE) [phone] MG tablet Take 1 tablet every 6 (six) hours as needed by mouth. 06/11/17  Yes Ward, Ozella Almond, PA-C  albuterol (PROVENTIL HFA;VENTOLIN HFA) 108 (90 Base) MCG/ACT inhaler Inhale 2 puffs into the lungs every 4 (four) hours as needed for shortness of breath.  07/17/16   Mesner, Corene Cornea, MD  diclofenac (VOLTAREN) 50 MG EC tablet Take 1 tablet (50 mg total) by mouth 2 (two) times daily. 07/20/19   Tasia Catchings, Ivy Puryear V, PA-C  fluticasone (FLONASE) 50 MCG/ACT nasal spray Place 2 sprays into both nostrils daily. 02/07/19 04/19/19  Ok Edwards, PA-C    Family History Family History  Problem Relation Age of Onset  . Hypertension Mother   . Breast cancer Mother   . Hypertension Father   . Hypertension Sister   . Hypertension Maternal Grandmother   . Cancer Paternal Grandmother   . Cancer Paternal Grandfather     Social History Social History   Tobacco Use  . Smoking status: Current Every Day Smoker    Packs/day: 0.25    Years: 7.00    Pack years: 1.75    Types: Cigarettes  . Smokeless tobacco: Never Used  Substance Use Topics  . Alcohol use: No  . Drug use: No     Allergies   Penicillins   Review of Systems Review of Systems  Reason unable to perform ROS: See HPI as above.     Physical Exam Triage Vital Signs ED Triage Vitals  Enc Vitals Group     BP 07/20/19 0902 140/84  Pulse Rate 07/20/19 0902 80     Resp 07/20/19 0902 18     Temp 07/20/19 0902 97.6 F (36.4 C)     Temp Source 07/20/19 0902 Temporal     SpO2 07/20/19 0902 95 %     Weight --      Height --      Head Circumference --      Peak Flow --      Pain Score 07/20/19 0905 7     Pain Loc --      Pain Edu? --      Excl. in GC? --    No data found.  Updated Vital Signs BP 140/84 (BP Location: Left Arm)   Pulse 80   Temp 97.6 F (36.4 C) (Temporal)   Resp 18   SpO2 95%   Physical Exam Constitutional:      General: She is not in acute distress.    Appearance: Normal appearance. She is well-developed. She is not ill-appearing, toxic-appearing or diaphoretic.     Comments: Sitting comfortably in exam chair without any distress  HENT:     Head: Normocephalic and atraumatic.     Mouth/Throat:     Mouth: Mucous membranes are moist.     Pharynx: Oropharynx is  clear. Uvula midline.  Cardiovascular:     Rate and Rhythm: Normal rate and regular rhythm.     Heart sounds: Normal heart sounds. No murmur. No friction rub. No gallop.   Pulmonary:     Effort: Pulmonary effort is normal. No accessory muscle usage, prolonged expiration, respiratory distress or retractions.     Comments: Lungs clear to auscultation without adventitious lung sounds. Chest:     Comments: Tenderness to palpation of along sternum. No tenderness to bilateral chest Musculoskeletal:     Cervical back: Normal range of motion and neck supple.     Comments: No tenderness to palpation of shoulders. Full ROM of neck, shoulder, elbow. Strength normal and equal bilaterally. Normal grip strength. Sensation intact and equal bilaterally. Radial pulse 2+, cap refill <2s  Skin:    General: Skin is warm and dry.  Neurological:     General: No focal deficit present.     Mental Status: She is alert and oriented to person, place, and time.      UC Treatments / Results  Labs (all labs ordered are listed, but only abnormal results are displayed) Labs Reviewed - No data to display  EKG   Radiology No results found.  Procedures Procedures (including critical care time)  Medications Ordered in UC Medications - No data to display  Initial Impression / Assessment and Plan / UC Course  I have reviewed the triage vital signs and the nursing notes.  Pertinent labs & imaging results that were available during my care of the patient were reviewed by me and considered in my medical decision making (see chart for details).    History and exam consistent with chest wall pain/costochondritis.  Will start diclofenac as directed.  Return precautions given.  Patient expresses understanding and agrees to plan.  Final Clinical Impressions(s) / UC Diagnoses   Final diagnoses:  Chest wall pain   ED Prescriptions    Medication Sig Dispense Auth. Provider   diclofenac (VOLTAREN) 50 MG EC tablet  Take 1 tablet (50 mg total) by mouth 2 (two) times daily. 20 tablet Belinda Fisher, PA-C     PDMP not reviewed this encounter.   Belinda Fisher, PA-C 07/20/19 [phone]

## 2019-07-20 NOTE — ED Notes (Signed)
Patient able to ambulate independently  

## 2019-08-28 ENCOUNTER — Ambulatory Visit: Payer: No Typology Code available for payment source

## 2019-09-04 ENCOUNTER — Ambulatory Visit (INDEPENDENT_AMBULATORY_CARE_PROVIDER_SITE_OTHER): Payer: No Typology Code available for payment source | Admitting: Certified Nurse Midwife

## 2019-09-04 ENCOUNTER — Other Ambulatory Visit: Payer: Self-pay

## 2019-09-04 VITALS — BP 140/85 | HR 80 | Temp 98.2°F | Wt 269.0 lb

## 2019-09-04 DIAGNOSIS — Z3042 Encounter for surveillance of injectable contraceptive: Secondary | ICD-10-CM

## 2019-09-04 MED ORDER — MEDROXYPROGESTERONE ACETATE 150 MG/ML IM SUSP
150.0000 mg | Freq: Once | INTRAMUSCULAR | Status: AC
  Administered 2019-09-04: 16:00:00 150 mg via INTRAMUSCULAR

## 2019-09-04 NOTE — Progress Notes (Signed)
Patient is here for Depo Provera Injection Patient is within Depo Provera Calender Limits 1/25-09/11/2019 Next Depo Due between: 4/19/-12/04/2019 Last AEX: 05/29/19 AEX Scheduled: 05/29/2020 with DL  Patient is aware when next depo is due  Pt tolerated Injection well in RUOQ.  Routed to provider for review, encounter closed.  Pt states had "high blood pressure"  Today at work. 138/92. Pt felt dizzy and light headed. Pt states does not have PCP. PCP recommendations given at [facility]. Pt states will call when gets home for appt. Pt also has TOC on 09-05-2019 with DL.

## 2019-09-05 ENCOUNTER — Other Ambulatory Visit: Payer: Self-pay

## 2019-09-05 ENCOUNTER — Encounter: Payer: Self-pay | Admitting: Certified Nurse Midwife

## 2019-09-05 ENCOUNTER — Ambulatory Visit (INDEPENDENT_AMBULATORY_CARE_PROVIDER_SITE_OTHER): Payer: No Typology Code available for payment source | Admitting: Certified Nurse Midwife

## 2019-09-05 VITALS — BP 130/84 | HR 78 | Temp 97.9°F | Resp 16 | Wt 272.0 lb

## 2019-09-05 DIAGNOSIS — Z8619 Personal history of other infectious and parasitic diseases: Secondary | ICD-10-CM

## 2019-09-05 NOTE — Progress Notes (Signed)
Review of Systems  Constitutional: Negative.   HENT: Negative.   Eyes: Negative.   Respiratory: Negative.   Cardiovascular: Negative.   Gastrointestinal: Negative.   Genitourinary: Negative.   Musculoskeletal: Negative.   Skin:       Spot on breast  Neurological: Negative.   Endo/Heme/Allergies: Negative.   Psychiatric/Behavioral: Negative.     29 y.o. Single African American female [id number] here for follow up of Chlamydia treated with Azithromycin initiated on October 05/29/2019. Completed all medication as directed.  Denies any symptoms of pelvic pain or increase in discharge. Patient has right breast area she would like to have checked "looks like a pimple". No fever or chills. Noted about 2-3 days ago. 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 Patient has noted headache periodically. BP noted elevated in office on 09-04-2019 with Depo Provera follow up and elevated today again. Patient denies any edema of hands or feet. Patient is smoker. Brother is seeing MD for hypertension also. No other health issues today.    O: Healthy WD,WN female Affect: normal appearance Skin:warm and dry Abdomen:soft, nontender, normal bowel sounds Pelvic exam:EXTERNAL GENITALIA: normal appearing vulva with no masses, tenderness or lesions VAGINA: no abnormal discharge or lesions and chlamydia screening taken CERVIX: no lesions or cervical motion tenderness, normal appearance UTERUS: gravid, anteverted and normal shape,non tender, soft ADNEXA: no masses palpable and nontender RECTUM: no lesions noted around entrance  A:History of chlamydia treated( partner informed) Right breast tiny sebaceous cyst with exudate noted on exam which resolved with breast exam(fell off)  Elevated  BP on exam today and on 09/04/2019 while here also     P: Discussed findings of normal pelvic exam. Lab: GC/Chlamydia Right breast sebaceous cyst resolved, normal breast exam Discussed BP elevation and  needs to establish with PCP regarding management. Given information to schedule. Discussed increasing water intake, decrease salt in diet. Work on weight loss and good diet.  Warning signs of Hypertension given and need to see Urgent or ER care if occurred. Patient plans to establish with PCP. Labs: Gc/chlamydia   RV prn

## 2019-09-05 NOTE — Patient Instructions (Signed)
Hypertension, Adult Hypertension is another name for high blood pressure. High blood pressure forces your heart to work harder to pump blood. This can cause problems over time. There are two numbers in a blood pressure reading. There is a top number (systolic) over a bottom number (diastolic). It is best to have a blood pressure that is below 120/80. Healthy choices can help lower your blood pressure, or you may need medicine to help lower it. What are the causes? The cause of this condition is not known. Some conditions may be related to high blood pressure. What increases the risk?  Smoking.  Having type 2 diabetes mellitus, high cholesterol, or both.  Not getting enough exercise or physical activity.  Being overweight.  Having too much fat, sugar, calories, or salt (sodium) in your diet.  Drinking too much alcohol.  Having long-term (chronic) kidney disease.  Having a family history of high blood pressure.  Age. Risk increases with age.  Race. You may be at higher risk if you are African American.  Gender. Men are at higher risk than women before age 45. After age 65, women are at higher risk than men.  Having obstructive sleep apnea.  Stress. What are the signs or symptoms?  High blood pressure may not cause symptoms. Very high blood pressure (hypertensive crisis) may cause: ? Headache. ? Feelings of worry or nervousness (anxiety). ? Shortness of breath. ? Nosebleed. ? A feeling of being sick to your stomach (nausea). ? Throwing up (vomiting). ? Changes in how you see. ? Very bad chest pain. ? Seizures. How is this treated?  This condition is treated by making healthy lifestyle changes, such as: ? Eating healthy foods. ? Exercising more. ? Drinking less alcohol.  Your health care provider may prescribe medicine if lifestyle changes are not enough to get your blood pressure under control, and if: ? Your top number is above 130. ? Your bottom number is above  80.  Your personal target blood pressure may vary. Follow these instructions at home: Eating and drinking   If told, follow the DASH eating plan. To follow this plan: ? Fill one half of your plate at each meal with fruits and vegetables. ? Fill one fourth of your plate at each meal with whole grains. Whole grains include whole-wheat pasta, brown rice, and whole-grain bread. ? Eat or drink low-fat dairy products, such as skim milk or low-fat yogurt. ? Fill one fourth of your plate at each meal with low-fat (lean) proteins. Low-fat proteins include fish, chicken without skin, eggs, beans, and tofu. ? Avoid fatty meat, cured and processed meat, or chicken with skin. ? Avoid pre-made or processed food.  Eat less than 1,500 mg of salt each day.  Do not drink alcohol if: ? Your doctor tells you not to drink. ? You are pregnant, may be pregnant, or are planning to become pregnant.  If you drink alcohol: ? Limit how much you use to:  0-1 drink a day for women.  0-2 drinks a day for men. ? Be aware of how much alcohol is in your drink. In the [place] one drink equals one 12 oz bottle of beer (355 mL), one 5 oz glass of wine (148 mL), or one 1 oz glass of hard liquor (44 mL). Lifestyle   Work with your doctor to stay at a healthy weight or to lose weight. Ask your doctor what the best weight is for you.  Get at least 30 minutes of exercise most   days of the week. This may include walking, swimming, or biking.  Get at least 30 minutes of exercise that strengthens your muscles (resistance exercise) at least 3 days a week. This may include lifting weights or doing Pilates.  Do not use any products that contain nicotine or tobacco, such as cigarettes, e-cigarettes, and chewing tobacco. If you need help quitting, ask your doctor.  Check your blood pressure at home as told by your doctor.  Keep all follow-up visits as told by your doctor. This is important. Medicines  Take over-the-counter  and prescription medicines only as told by your doctor. Follow directions carefully.  Do not skip doses of blood pressure medicine. The medicine does not work as well if you skip doses. Skipping doses also puts you at risk for problems.  Ask your doctor about side effects or reactions to medicines that you should watch for. Contact a doctor if you:  Think you are having a reaction to the medicine you are taking.  Have headaches that keep coming back (recurring).  Feel dizzy.  Have swelling in your ankles.  Have trouble with your vision. Get help right away if you:  Get a very bad headache.  Start to feel mixed up (confused).  Feel weak or numb.  Feel faint.  Have very bad pain in your: ? Chest. ? Belly (abdomen).  Throw up more than once.  Have trouble breathing. Summary  Hypertension is another name for high blood pressure.  High blood pressure forces your heart to work harder to pump blood.  For most people, a normal blood pressure is less than 120/80.  Making healthy choices can help lower blood pressure. If your blood pressure does not get lower with healthy choices, you may need to take medicine. This information is not intended to replace advice given to you by your health care provider. Make sure you discuss any questions you have with your health care provider. Document Revised: 03/30/2018 Document Reviewed: 03/30/2018 Elsevier Patient Education  2020 Else[employer]. Preventing Sexually Transmitted Infections, Adult Sexually transmitted infections (STIs) are diseases that are passed (transmitted) from person to person through bodily fluids exchanged during sex or sexual contact. Bodily fluids include saliva, semen, blood, vaginal mucus, and urine. You may have an increased risk for developing an STI if you have unprotected oral, vaginal, or anal sex. Some common STIs include:  Herpes.  Hepatitis B.  Chlamydia.  Gonorrhea.  Syphilis.  HPV (human  papillomavirus).  HIV (human immunodeficiency virus), the virus that can cause AIDS (acquired immunodeficiency syndrome). How can I protect myself from sexually transmitted infections? The only way to completely prevent STIs is not to have sex of any kind (practice abstinence). This includes oral, vaginal, or anal sex. If you are sexually active, take these actions to lower your risk of getting an STI:  Have only one sex partner (be monogamous) or limit the number of sexual partners you have.  Stay up-to-date on immunizations. Certain vaccines can lower your risk of getting certain STIs, such as: ? Hepatitis A and B vaccines. You may have been vaccinated as a young child, but likely need a booster shot as a teen or young adult. ? HPV vaccine.  Use methods that prevent the exchange of body fluids between partners (barrier protection) every time you have sex. Barrier protection can be used during oral, vaginal, or anal sex. Commonly used barrier methods include: ? Female condom. ? Female condom. ? Dental dam.  Get tested regularly for STIs. Have  your sexual partner get tested regularly as well.  Avoid mixing alcohol, drugs, and sex. Alcohol and drug use can affect your ability to make good decisions and can lead to risky sexual behaviors.  Ask your health care provider about taking pre-exposure prophylaxis (PrEP) to prevent HIV infection if you: ? Have a HIV-positive sexual partner. ? Have multiple sexual partners or partners who do not know their HIV status, and do not regularly use a condom during sex. ? Use injection drugs and share needles. Birth control pills, injections, implants, and intrauterine devices (IUDs) do not protect against STIs. To prevent both STIs and pregnancy, always use a condom with another form of birth control. Some STIs, such as herpes, are spread through skin to skin contact. A condom does not protect you from getting such STIs. If you or your partner have herpes and  there is an active flare with open sores, avoid all sexual contact. Why are these changes important? Taking steps to practice safe sex protects you and others. Many STIs can be cured. However, some STIs are not curable and will affect you for the rest of your life. STIs can be passed on to another person even if you do not have symptoms. What can happen if changes are not made? Certain STIs may:  Require you to take medicine for the rest of your life.  Affect your ability to have children (your fertility).  Increase your risk for developing another STI or certain serious health conditions, such as: ? Cervical cancer. ? Head and neck cancer. ? Pelvic inflammatory disease (PID) in women. ? Organ damage or damage to other parts of your body, if the infection spreads.  Be passed to a baby during childbirth. How are sexually transmitted infections treated? If you or your partner know or think that you may have an STI:  Talk with your health care provider about what can be done to treat it. Some STIs can be treated and cured with medicines.  For curable STIs, you and your partner should avoid sex during treatment and for several days after treatment is complete.  You and your partner should both be treated at the same time, if there is any chance that your partner is infected as well. If you get treatment but your partner does not, your partner can re-infect you when you resume sexual contact.  Do not have unprotected sex. Where to find more information Learn more about sexually transmitted diseases and infections from:  Centers for Disease Control and Prevention: ? More information about specific STIs: AppraiserFraud.fi ? Find places to get sexual health counseling and treatment for free or for a low cost: gettested.StoreMirror.com.cy  [place] Department of Health and Human Services: http://white.info/.html Summary  The only  way to completely prevent STIs is not to have sex (practice abstinence), including oral, vaginal, or anal sex.  STIs can spread through saliva, semen, blood, vaginal mucus, urine, or sexual contact.  If you do have sex, limit your number of sexual partners and use a barrier protection method every time you have sex.  If you develop an STI, get treated right away and ask your partner to be treated as well. Do not resume having sex until both of you have completed treatment for the STI. This information is not intended to replace advice given to you by your health care provider. Make sure you discuss any questions you have with your health care provider. Document Revised: 12/13/2018 Document Reviewed: 07/16/2016 Elsevier Patient Education  [phone]  [employer].

## 2019-09-06 ENCOUNTER — Ambulatory Visit
Admission: EM | Admit: 2019-09-06 | Discharge: 2019-09-06 | Disposition: A | Discharge status: Home / Self Care | Payer: No Typology Code available for payment source

## 2019-09-06 DIAGNOSIS — R030 Elevated blood-pressure reading, without diagnosis of hypertension: Secondary | ICD-10-CM | POA: Diagnosis not present

## 2019-09-06 LAB — GC/CHLAMYDIA PROBE AMP
Chlamydia trachomatis, NAA: NEGATIVE
Neisseria Gonorrhoeae by PCR: NEGATIVE

## 2019-09-06 NOTE — Discharge Instructions (Addendum)
Keep blood pressure log as discussed Read DASH diet info Keep PCP appointment on 2/17 Check out Headspace app for relaxation techniques

## 2019-09-06 NOTE — ED Provider Notes (Signed)
[name]    CSN: [id number] Arrival date & time: 09/06/19  1225      History   Chief Complaint Chief Complaint  Patient presents with  . Dizziness    HPI Frances Murphy is a 29 y.o. female with history of asthma, migraines presenting for concern of elevated blood pressure.  Patient states that she gets headaches when her blood pressure is high and has been checking her blood pressure at home whenever she gets them.  Highest BP was 2 days ago: 144/100.  Patient states that she had an episode of dizziness today at work that was brief and after going from sitting to standing.  Patient states she left work due to concern about blood pressure given recent readings.  Denies dizziness in office today, change in vision or hearing, tinnitus, facial droop, dysarthria, chest pain, palpitations, difficulty breathing, nausea, vomiting, weakness.  Has not taken anything for symptoms.  Patient appears to be largely concerned as there is significant family history of hypertension and her mother has to take 3 blood pressure medications.   Past Medical History:  Diagnosis Date  . Asthma   . Migraines    with aura  . STD (sexually transmitted disease)    chlamydia 05-29-2019 treated    Patient Active Problem List   Diagnosis Date Noted  . Preterm delivery, delivered 04/13/2013  . Preterm premature rupture of membranes 02/01/2013  . Premature labor 01/26/2013  . 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 Asthma 11/02/2012  . Obesity 11/02/2012  . Family history of breast cancer in mother 11/02/2012  . Current smoker 11/02/2012    Past Surgical History:  Procedure Laterality Date  . None      OB History    Gravida  1   Para  1   Term  0   Preterm  1   AB  0   Living  0     SAB  0   TAB  0   Ectopic  0   Multiple  0   Live Births  1            Home Medications    Prior to Admission medications   Medication Sig Start Date End Date Taking?  Authorizing Provider  albuterol (PROVENTIL HFA;VENTOLIN HFA) 108 (90 Base) MCG/ACT inhaler Inhale 2 puffs into the lungs every 4 (four) hours as needed for shortness of breath. 07/17/16   Mesner, Corene Cornea, MD  aspirin-acetaminophen-caffeine (EXCEDRIN MIGRAINE) [phone] MG tablet Take 1 tablet every 6 (six) hours as needed by mouth. 06/11/17   Ward, Ozella Almond, PA-C  fluticasone (FLONASE) 50 MCG/ACT nasal spray Place 2 sprays into both nostrils daily. 02/07/19 04/19/19  Ok Edwards, PA-C    Family History Family History  Problem Relation Age of Onset  . Hypertension Mother   . Breast cancer Mother   . Hypertension Father   . Hypertension Sister   . Hypertension Maternal Grandmother   . Cancer Paternal Grandmother   . Cancer Paternal Grandfather     Social History Social History   Tobacco Use  . Smoking status: Current Every Day Smoker    Packs/day: 0.25    Years: 7.00    Pack years: 1.75    Types: Cigarettes  . Smokeless tobacco: Never Used  Substance Use Topics  . Alcohol use: No  . Drug use: No     Allergies   Penicillins   Review of Systems As per HPI   Physical Exam Triage Vital  Signs ED Triage Vitals  Enc Vitals Group     BP 09/06/19 1245 131/83     Pulse Rate 09/06/19 1245 79     Resp 09/06/19 1245 18     Temp 09/06/19 1245 98.7 F (37.1 C)     Temp Source 09/06/19 1245 Oral     SpO2 09/06/19 1245 98 %     Weight --      Height --      Head Circumference --      Peak Flow --      Pain Score 09/06/19 1304 6     Pain Loc --      Pain Edu? --      Excl. in GC? --    No data found.  Updated Vital Signs BP 131/83 (BP Location: Left Arm)   Pulse 79   Temp 98.7 F (37.1 C) (Oral)   Resp 18   LMP  (LMP Unknown)   SpO2 98%   Visual Acuity Right Eye Distance:   Left Eye Distance:   Bilateral Distance:    Right Eye Near:   Left Eye Near:    Bilateral Near:     Physical Exam Constitutional:      General: She is not in acute distress.     Appearance: She is obese. She is not ill-appearing or diaphoretic.  HENT:     Head: Normocephalic and atraumatic.     Right Ear: Tympanic membrane, ear canal and external ear normal.     Left Ear: Tympanic membrane, ear canal and external ear normal.     Mouth/Throat:     Mouth: Mucous membranes are moist.     Pharynx: Oropharynx is clear.  Eyes:     General: No scleral icterus.    Extraocular Movements: Extraocular movements intact.     Conjunctiva/sclera: Conjunctivae normal.     Pupils: Pupils are equal, round, and reactive to light.  Cardiovascular:     Rate and Rhythm: Normal rate and regular rhythm.     Heart sounds: No murmur. No gallop.   Pulmonary:     Effort: Pulmonary effort is normal. No respiratory distress.     Breath sounds: No wheezing or rales.  Musculoskeletal:        General: No deformity. Normal range of motion.     Cervical back: Normal range of motion and neck supple. No rigidity or tenderness.  Lymphadenopathy:     Cervical: No cervical adenopathy.  Skin:    Capillary Refill: Capillary refill takes less than 2 seconds.     Coloration: Skin is not jaundiced.     Findings: No bruising or rash.  Neurological:     General: No focal deficit present.     Mental Status: She is alert.     Cranial Nerves: Cranial nerves are intact.     Sensory: Sensation is intact.     Motor: Motor function is intact.     Coordination: Coordination is intact.     Gait: Gait is intact.     Comments: Negative head jerk test.  Symptoms not reproduced on exam  Psychiatric:        Mood and Affect: Mood normal.        Behavior: Behavior normal.      UC Treatments / Results  Labs (all labs ordered are listed, but only abnormal results are displayed) Labs Reviewed - No data to display  EKG   Radiology No results found.  Procedures Procedures (including critical care time)  Medications Ordered in UC Medications - No data to display  Initial Impression / Assessment and  Plan / UC Course  I have reviewed the triage vital signs and the nursing notes.  Pertinent labs & imaging results that were available during my care of the patient were reviewed by me and considered in my medical decision making (see chart for details).     Patient afebrile, nontoxic in office today.  Not actively experiencing dizziness, chest pain, lightheadedness, weakness: EKG deferred as blood pressure is 131/83 and she has normal cardiac rate.  Stressed importance of follow-up care and general health maintenance/preventative screenings through PCP: Patient has appointment 09/20/2019: Intends to keep.  Will check blood pressure 3 times weekly, keep log in the interim.  Return precautions discussed, patient verbalized understanding and is agreeable to plan. Final Clinical Impressions(s) / UC Diagnoses   Final diagnoses:  Elevated blood pressure reading without diagnosis of hypertension     Discharge Instructions     Keep blood pressure log as discussed Read DASH diet info Keep PCP appointment on 2/17 Check out Headspace app for relaxation techniques    ED Prescriptions    None     PDMP not reviewed this encounter.   Hall-Potvin, [place], [place] 09/07/19 1553

## 2019-09-06 NOTE — ED Triage Notes (Signed)
Pt states on Monday she left work early d/t dizziness. States her b/p has been elevated. States headache yesterday.

## 2019-09-09 LAB — HM PAP SMEAR: HM Pap smear: NORMAL

## 2019-09-20 ENCOUNTER — Ambulatory Visit (INDEPENDENT_AMBULATORY_CARE_PROVIDER_SITE_OTHER): Payer: No Typology Code available for payment source | Admitting: Family Medicine

## 2019-09-20 ENCOUNTER — Encounter: Payer: Self-pay | Admitting: Family Medicine

## 2019-09-20 ENCOUNTER — Other Ambulatory Visit: Payer: Self-pay

## 2019-09-20 DIAGNOSIS — F17200 Nicotine dependence, unspecified, uncomplicated: Secondary | ICD-10-CM | POA: Diagnosis not present

## 2019-09-20 DIAGNOSIS — D573 Sickle-cell trait: Secondary | ICD-10-CM

## 2019-09-20 DIAGNOSIS — R519 Headache, unspecified: Secondary | ICD-10-CM | POA: Diagnosis not present

## 2019-09-20 MED ORDER — AMITRIPTYLINE HCL 25 MG PO TABS: 25.0000 mg | ORAL_TABLET | Freq: Every day | ORAL | 1 refills | Status: DC

## 2019-09-20 MED ORDER — NICOTINE 21 MG/24HR TD PT24: 21.0000 mg | MEDICATED_PATCH | Freq: Every day | TRANSDERMAL | 0 refills | Status: DC

## 2019-09-20 NOTE — Progress Notes (Signed)
HPI:   Frances Murphy is a 29 y.o. female, who is here today to establish care.  Former PCP: N/A Last preventive routine visit: Gyn routine exam on 05/29/19.  Chronic medical problems: Tobacco use disorder,migraine headaches, asthma,and obesity among some. On Depo Provera for birth control.  Problem list has hemoglobin A-S genotype. She is not aware of hemoglobinopathies or sickle cell trait.  Lab Results  Component Value Date   WBC 14.8 (H) 03/07/2018   HGB 12.9 03/07/2018   HCT 39.8 03/07/2018   MCV 86.0 03/07/2018   PLT 247 03/07/2018     Smoker since age 35, 1/2 PPD. She lives with her parents.  ED evaluation on 09/06/2019 because elevated BP. She checked BP and BP was "normal." She has not noted chest pain, dyspnea, palpitation, claudication, focal weakness, or edema.  Lab Results  Component Value Date   CREATININE 0.94 03/07/2018   BUN 9 03/07/2018   NA 139 03/07/2018   K 4.2 03/07/2018   CL 105 03/07/2018   CO2 25 03/07/2018   She does not exercise regularly but has an active job,walks for 8 hours. She has not been consistent with following a healthful diet. She is trying to cook more with olive oil and eat less fried food.Decreased soda intake and drinking more water.  Reporting years hx of migraine headache. She is having a headache almost daily. Intermittent. Fronto-temporal pressure like headache, 8/10. + Phonophobia. No associated photophobia,N/V,or neurologic focal deficit. "Little" blurry vision.   Review of Systems  Constitutional: Negative for activity change, appetite change, fatigue and fever.  HENT: Positive for congestion, postnasal drip and rhinorrhea. Negative for mouth sores, nosebleeds and sore throat.   Respiratory: Negative for cough, shortness of breath and wheezing.   Cardiovascular: Negative for chest pain, palpitations and leg swelling.  Gastrointestinal: Negative for abdominal pain.       Negative for changes in bowel  habits.  Genitourinary: Negative for decreased urine volume and hematuria.  Musculoskeletal: Negative for gait problem and myalgias.  Skin: Negative for rash and wound.  Allergic/Immunologic: Positive for environmental allergies.  Neurological: Negative for syncope, facial asymmetry and weakness.  Rest see pertinent positives and negatives per HPI.   Current Outpatient Medications on File Prior to Visit  Medication Sig Dispense Refill  . albuterol (PROVENTIL HFA;VENTOLIN HFA) 108 (90 Base) MCG/ACT inhaler Inhale 2 puffs into the lungs every 4 (four) hours as needed for shortness of breath. 1 Inhaler 0  . aspirin-acetaminophen-caffeine (EXCEDRIN MIGRAINE) 250-250-65 MG tablet Take 1 tablet every 6 (six) hours as needed by mouth. 30 tablet 0  . [DISCONTINUED] fluticasone (FLONASE) 50 MCG/ACT nasal spray Place 2 sprays into both nostrils daily. 1 g 0   No current facility-administered medications on file prior to visit.     Past Medical History:  Diagnosis Date  . Asthma   . Migraines    with aura  . STD (sexually transmitted disease)    chlamydia 05-29-2019 treated   Allergies  Allergen Reactions  . Penicillins     bumps    Family History  Problem Relation Age of Onset  . Hypertension Mother   . Breast cancer Mother   . Hypertension Father   . Hypertension Sister   . Hypertension Maternal Grandmother   . Cancer Paternal Grandmother   . Cancer Paternal Grandfather    Social History   Socioeconomic History  . Marital status: Single    Spouse name: Not on file  . Number of  children: Not on file  . Years of education: Not on file  . Highest education level: Not on file  Occupational History  . Not on file  Tobacco Use  . Smoking status: Current Every Day Smoker    Packs/day: 0.25    Years: 7.00    Pack years: 1.75    Types: Cigarettes  . Smokeless tobacco: Never Used  Substance and Sexual Activity  . Alcohol use: No  . Drug use: No  . Sexual activity: Yes     Partners: Male    Birth control/protection: Injection  Other Topics Concern  . Not on file  Social History Narrative  . Not on file   Social Determinants of Health   Financial Resource Strain:   . Difficulty of Paying Living Expenses: Not on file  Food Insecurity:   . Worried About Charity fundraiser in the Last Year: Not on file  . Ran Out of Food in the Last Year: Not on file  Transportation Needs:   . Lack of Transportation (Medical): Not on file  . Lack of Transportation (Non-Medical): Not on file  Physical Activity:   . Days of Exercise per Week: Not on file  . Minutes of Exercise per Session: Not on file  Stress:   . Feeling of Stress : Not on file  Social Connections:   . Frequency of Communication with Friends and Family: Not on file  . Frequency of Social Gatherings with Friends and Family: Not on file  . Attends Religious Services: Not on file  . Active Member of Clubs or Organizations: Not on file  . Attends Archivist Meetings: Not on file  . Marital Status: Not on file    Vitals:   09/20/19 1350  BP: 128/80  Pulse: 74  Resp: 12  Temp: (!) 96.7 F (35.9 C)    Body mass index is 42.35 kg/m.   Physical Exam  Nursing note and vitals reviewed. Constitutional: She is oriented to person, place, and time. She appears well-developed. No distress.  HENT:  Head: Normocephalic and atraumatic.  Mouth/Throat: Oropharynx is clear and moist and mucous membranes are normal.  Eyes: Pupils are equal, round, and reactive to light. Conjunctivae are normal.  Cardiovascular: Normal rate and regular rhythm.  No murmur heard. Pulses:      Dorsalis pedis pulses are 2+ on the right side and 2+ on the left side.  Respiratory: Effort normal and breath sounds normal. No respiratory distress.  GI: Soft. She exhibits no mass. There is no hepatomegaly. There is no abdominal tenderness.  Musculoskeletal:        General: No edema.  Lymphadenopathy:    She has no  cervical adenopathy.  Neurological: She is alert and oriented to person, place, and time. She has normal strength. No cranial nerve deficit. Gait normal.  Skin: Skin is warm. No rash noted. No erythema.  Psychiatric: She has a normal mood and affect.  Well groomed, good eye contact.   ASSESSMENT AND PLAN:  Ms. Roanne was seen today for establish care.  Diagnoses and all orders for this visit:  Morbid obesity ([place]) We discussed benefits of wt loss as well as adverse effects of obesity. Consistency with healthy diet and physical activity recommended. Daily brisk walking for 15 min as tolerated.  Headache, unspecified headache type ? Tension headache. After discussion of pharmacologic options,she agrees with trying Amitriptyline 25 mg daily,she can start with 1/2 tab x 7-10 days and increase to 25 mg if  well tolerated. Side effects discussed. Start a headache diary.  -     amitriptyline (ELAVIL) 25 MG tablet; Take 1 tablet (25 mg total) by mouth at bedtime.  Hemoglobin A-S genotype (HCC) Not aware of abnormal Hg. I cannot find documentation. ? Sickle cell trait.  Tobacco use disorder After discussion of adverse effects of tobacco use and great benefits of smoking cessation,she agrees with trying Nicotine patches. Side effects discussed. Amitriptyline may also help.  -     nicotine (NICODERM CQ - DOSED IN MG/24 HOURS) 21 mg/24hr patch; Place 1 patch (21 mg total) onto the skin daily.    Return in about 6 weeks (around 11/01/2019) for HA,smoking.     Lamekia Nolden G. [place], MD  [facility]. [place] office.

## 2019-09-20 NOTE — Patient Instructions (Signed)
A few things to remember from today's visit:   Morbid obesity (HCC)  Hemoglobin A-S genotype (HCC), Chronic  Tobacco use disorder - Plan: nicotine (NICODERM CQ - DOSED IN MG/24 HOURS) 21 mg/24hr patch  Headache, unspecified headache type - Plan: amitriptyline (ELAVIL) 25 MG tablet  Start Amitriptyline 25 mg 1/2 tab daily at bedtime and increase to whole tab in 7-10 days if tolerated. Headache diary. Nicotine for smoking cessation.  Please be sure medication list is accurate. If a new problem present, please set up appointment sooner than planned today.

## 2019-10-10 ENCOUNTER — Telehealth: Payer: Self-pay | Admitting: Family Medicine

## 2019-10-10 NOTE — Telephone Encounter (Signed)
Pt has been having what might be an irregular heart beat, it happens more at night when she is getting ready to lay down. It beats harder than normal to the point where it is hard to catch her breath. She states that it did not start till after her appt on 2/17 she had with [place]. She would like advice on what to do? Pt is not sure if it could be the medication [place] prescribed her?   Pt has not been seen by anyone for her current situation.  Transferred to Nurse Triage due to symptoms.   Pt can be reached at [phone]

## 2019-10-10 NOTE — Telephone Encounter (Signed)
Please schedule visit to discuss this particular problem. Thanks, BJ

## 2019-10-11 NOTE — Telephone Encounter (Signed)
Pt has been scheduled for Monday at 4pm. Pt works until 5pm so she needed the latest as possible to not miss a lot of work.

## 2019-10-11 NOTE — Telephone Encounter (Signed)
Pt needs appt, can be scheduled with different provider if she doesn't want to wait until Monday. Okay to use virtual afternoon slot if needed.

## 2019-10-16 ENCOUNTER — Ambulatory Visit (INDEPENDENT_AMBULATORY_CARE_PROVIDER_SITE_OTHER): Payer: No Typology Code available for payment source | Admitting: Family Medicine

## 2019-10-16 ENCOUNTER — Encounter: Payer: Self-pay | Admitting: Family Medicine

## 2019-10-16 ENCOUNTER — Encounter: Payer: Self-pay | Admitting: Certified Nurse Midwife

## 2019-10-16 ENCOUNTER — Other Ambulatory Visit: Payer: Self-pay

## 2019-10-16 VITALS — BP 140/90 | HR 96 | Resp 12 | Ht 67.0 in | Wt 278.0 lb

## 2019-10-16 DIAGNOSIS — R002 Palpitations: Secondary | ICD-10-CM | POA: Diagnosis not present

## 2019-10-16 DIAGNOSIS — R030 Elevated blood-pressure reading, without diagnosis of hypertension: Secondary | ICD-10-CM

## 2019-10-16 DIAGNOSIS — R9431 Abnormal electrocardiogram [ECG] [EKG]: Secondary | ICD-10-CM | POA: Diagnosis not present

## 2019-10-16 DIAGNOSIS — R519 Headache, unspecified: Secondary | ICD-10-CM

## 2019-10-16 MED ORDER — TOPIRAMATE 25 MG PO TABS: 25.0000 mg | ORAL_TABLET | Freq: Every day | ORAL | 1 refills | Status: DC

## 2019-10-16 NOTE — Progress Notes (Signed)
HPI:  Chief Complaint  Patient presents with  . irregular heart rate    Frances Murphy is a 29 y.o. female, who is here today complaining of a month of intermittent episodes of pounding sensation upper mid chest,palpitations. She is not sure about rhythm.  This happens at night when "relaxing" and in bed. She is not sure about duration,usually falls asleep. She sometimes feels like she has some difficulty breathing, better after breathing deep a couple times.  She has no symptoms with exertion. Negative for associated CP or diaphoresis.  She has been under some stress. She has not tried OTC treatments.  Lab Results  Component Value Date   CREATININE 0.94 03/07/2018   BUN 9 03/07/2018   NA 139 03/07/2018   K 4.2 03/07/2018   CL 105 03/07/2018   CO2 25 03/07/2018   Lab Results  Component Value Date   WBC 14.8 (H) 03/07/2018   HGB 12.9 03/07/2018   HCT 39.8 03/07/2018   MCV 86.0 03/07/2018   PLT 247 03/07/2018  No fever,chill,or body aches.  Last visit. 09/20/19,she was c/o headaches, Amitriptyline was recommended. Because day time drowsiness when taking medication,she is not taking medication daily. She has taken med x 2. Fronto-temporal pressure like headache, almost daily. No associated N/V,visual changes,photophobia,or focal weakness. Not sure about exacerbating factor.  Excedrin helps with headache.   Review of Systems  Constitutional: Positive for fatigue. Negative for activity change and appetite change.  HENT: Negative for mouth sores, nosebleeds and sore throat.   Respiratory: Negative for cough and wheezing.   Cardiovascular: Negative for leg swelling.  Gastrointestinal: Negative for abdominal pain.       Negative for changes in bowel habits.  Genitourinary: Negative for decreased urine volume and hematuria.  Neurological: Negative for syncope, facial asymmetry and speech difficulty.  Psychiatric/Behavioral: Negative for confusion. The patient is  nervous/anxious.   Rest see pertinent positives and negatives per HPI.  Current Outpatient Medications on File Prior to Visit  Medication Sig Dispense Refill  . albuterol (PROVENTIL HFA;VENTOLIN HFA) 108 (90 Base) MCG/ACT inhaler Inhale 2 puffs into the lungs every 4 (four) hours as needed for shortness of breath. 1 Inhaler 0  . aspirin-acetaminophen-caffeine (EXCEDRIN MIGRAINE) 250-250-65 MG tablet Take 1 tablet every 6 (six) hours as needed by mouth. 30 tablet 0  . nicotine (NICODERM CQ - DOSED IN MG/24 HOURS) 21 mg/24hr patch Place 1 patch (21 mg total) onto the skin daily. 28 patch 0  . [DISCONTINUED] fluticasone (FLONASE) 50 MCG/ACT nasal spray Place 2 sprays into both nostrils daily. 1 g 0   No current facility-administered medications on file prior to visit.   Past Medical History:  Diagnosis Date  . Asthma   . Migraines    with aura  . STD (sexually transmitted disease)    chlamydia 05-29-2019 treated   Allergies  Allergen Reactions  . Penicillins     bumps    Social History   Socioeconomic History  . Marital status: Single    Spouse name: Not on file  . Number of children: Not on file  . Years of education: Not on file  . Highest education level: Not on file  Occupational History  . Not on file  Tobacco Use  . Smoking status: Current Every Day Smoker    Packs/day: 0.25    Years: 7.00    Pack years: 1.75    Types: Cigarettes  . Smokeless tobacco: Never Used  Substance and Sexual Activity  .  Alcohol use: No  . Drug use: No  . Sexual activity: Yes    Partners: Male    Birth control/protection: Injection  Other Topics Concern  . Not on file  Social History Narrative  . Not on file   Social Determinants of Health   Financial Resource Strain:   . Difficulty of Paying Living Expenses:   Food Insecurity:   . Worried About Programme researcher, broadcasting/film/video in the Last Year:   . Barista in the Last Year:   Transportation Needs:   . Freight forwarder  (Medical):   Marland Kitchen Lack of Transportation (Non-Medical):   Physical Activity:   . Days of Exercise per Week:   . Minutes of Exercise per Session:   Stress:   . Feeling of Stress :   Social Connections:   . Frequency of Communication with Friends and Family:   . Frequency of Social Gatherings with Friends and Family:   . Attends Religious Services:   . Active Member of Clubs or Organizations:   . Attends Banker Meetings:   Marland Kitchen Marital Status:     Vitals:   10/16/19 1559  BP: 140/90  Pulse: 96  Resp: 12   Body mass index is 43.54 kg/m.  Physical Exam  Nursing note and vitals reviewed. Constitutional: She is oriented to person, place, and time. She appears well-developed. No distress.  HENT:  Head: Normocephalic and atraumatic.  Mouth/Throat: Oropharynx is clear and moist and mucous membranes are normal.  Eyes: Pupils are equal, round, and reactive to light. Conjunctivae are normal.  Cardiovascular: Normal rate and regular rhythm.  No murmur heard. Pulses:      Dorsalis pedis pulses are 2+ on the right side and 2+ on the left side.  Respiratory: Effort normal and breath sounds normal. No respiratory distress.  GI: Soft. She exhibits no mass. There is no hepatomegaly. There is no abdominal tenderness.  Musculoskeletal:        General: No edema.  Lymphadenopathy:    She has no cervical adenopathy.  Neurological: She is alert and oriented to person, place, and time. She has normal strength. No cranial nerve deficit. Gait normal.  Skin: Skin is warm. No rash noted. No erythema.  Psychiatric: Her mood appears anxious.  Well groomed, good eye contact.   ASSESSMENT AND PLAN:  Frances Murphy was seen today for irregular heart rate.  Diagnoses and all orders for this visit: Lab Results  Component Value Date   WBC 9.3 10/17/2019   HGB 13.4 10/17/2019   HCT 40.4 10/17/2019   MCV 86.3 10/17/2019   PLT 261.0 10/17/2019   Lab Results  Component Value Date   CREATININE  0.79 10/17/2019   BUN 8 10/17/2019   NA 141 10/17/2019   K 3.4 (L) 10/17/2019   CL 108 10/17/2019   CO2 26 10/17/2019   Lab Results  Component Value Date   TSH 1.85 10/17/2019   Abnormal EKG EKG today: SR,normal axis,and intervals. Unspecific T wave changes (inferior and anterior leads). Compared with EKG 04/2012 some T wave abnormalities are now present. Instructed about warning signs.  -     Ambulatory referral to Cardiology  Heart palpitations Hx and examination today do not suggest a serious cardiac process. Because abnormal EKG I am recommending cardio evaluation. Amitriptyline stopped. Adequate hydration. Further recommendations according to lab results.  -     EKG 12-Lead -     Ambulatory referral to Cardiology -  CBC -     Basic metabolic panel -     TSH  Headache, unspecified headache type Problem not well controlled. She agrees with trying Topamax. Some side effects discussed. Instructed about warning signs.  -     topiramate (TOPAMAX) 25 MG tablet; Take 1 tablet (25 mg total) by mouth at bedtime. -     CBC  Elevated blood pressure reading Recommend monitoring BP at home. Low salt diet. F/U in 6 weeks.  Return in about 6 weeks (around 11/27/2019) for HA.   Ndea Kilroy G. [place], MD  [facility]. [place] office.   A few things to remember from today's visit:  Stop amitriptyline. Start Topamax 25 mg at bedtime. Appointment with cardiology will be arranged. Monitor blood pressure. I will see her back in 6 weeks, please keep a headache diary.

## 2019-10-16 NOTE — Patient Instructions (Addendum)
A few things to remember from today's visit:  Stop amitriptyline. Start Topamax 25 mg at bedtime. Appointment with cardiology will be arranged. Monitor blood pressure. I will see her back in 6 weeks, please keep a headache diary.

## 2019-10-17 ENCOUNTER — Other Ambulatory Visit: Payer: No Typology Code available for payment source

## 2019-10-18 ENCOUNTER — Encounter: Payer: Self-pay | Admitting: Certified Nurse Midwife

## 2019-10-18 LAB — BASIC METABOLIC PANEL
BUN: 8 mg/dL (ref 6–23)
CO2: 26 mEq/L (ref 19–32)
Calcium: 9.1 mg/dL (ref 8.4–10.5)
Chloride: 108 mEq/L (ref 96–112)
Creatinine, Ser: 0.79 mg/dL (ref 0.40–1.20)
GFR: 103.69 mL/min (ref >60.00)
Glucose, Bld: 80 mg/dL (ref 70–99)
Potassium: 3.4 mEq/L — ABNORMAL LOW (ref 3.5–5.1)
Sodium: 141 mEq/L (ref 135–145)

## 2019-10-18 LAB — CBC
HCT: 40.4 % (ref 36.0–46.0)
Hemoglobin: 13.4 g/dL (ref 12.0–15.0)
MCHC: 33.1 g/dL (ref 30.0–36.0)
MCV: 86.3 fl (ref 78.0–100.0)
Platelets: 261 10*3/uL (ref 150.0–400.0)
RBC: 4.68 Mil/uL (ref 3.87–5.11)
RDW: 13.8 % (ref 11.5–15.5)
WBC: 9.3 10*3/uL (ref 4.0–10.5)

## 2019-10-18 LAB — TSH: TSH: 1.85 u[IU]/mL (ref 0.35–4.50)

## 2019-10-19 ENCOUNTER — Other Ambulatory Visit: Payer: Self-pay

## 2019-10-19 ENCOUNTER — Ambulatory Visit (INDEPENDENT_AMBULATORY_CARE_PROVIDER_SITE_OTHER): Payer: No Typology Code available for payment source | Admitting: Cardiology

## 2019-10-19 ENCOUNTER — Encounter: Payer: Self-pay | Admitting: *Deleted

## 2019-10-19 ENCOUNTER — Encounter: Payer: Self-pay | Admitting: Family Medicine

## 2019-10-19 ENCOUNTER — Encounter: Payer: Self-pay | Admitting: Cardiology

## 2019-10-19 VITALS — BP 120/90 | Ht 67.0 in | Wt 276.0 lb

## 2019-10-19 DIAGNOSIS — Z720 Tobacco use: Secondary | ICD-10-CM | POA: Diagnosis not present

## 2019-10-19 DIAGNOSIS — R002 Palpitations: Secondary | ICD-10-CM | POA: Diagnosis not present

## 2019-10-19 NOTE — Progress Notes (Signed)
Patient ID: Frances Murphy, female   DOB: March 15, 1991, 29 y.o.   MRN: [id number] Patient given 14 day ZIO XT patch long term holter monitor from office inventory.

## 2019-10-19 NOTE — Progress Notes (Signed)
Cardiology Office Note:    Date:  10/19/2019   ID:  Frances Murphy, DOB Nov 05, 1990, MRN [id number]  PCP:  [place], Betty G, MD  Cardiologist:  Donato Schultz, MD  Electrophysiologist:  None   Referring MD: [place], Betty G, MD     History of Present Illness:    Frances Murphy is a 29 y.o. female here for the evaluation of irregular heart rate, abnormal EKG at the request of Dr. Betty [place].  Been experiencing palpitations, pounding sensation upper middle chest.  Can happen at night when relaxing.  Usually goes away after she falls asleep.  No symptoms with exertion.  Increased stress at times.  Potassium normal previously creatinine normal.  TSH normal.  She notices these mostly at night when she is calm.  They do bother her.  Feel like they are in the middle of the chest.  No real pain however.  She has never had syncope.  No family history of coronary artery disease or early sudden cardiac death.  She is a smoker.  She is going to get the patches and try to quit.  Excellent.  She has weaned off of coffee, tried Sprite but then had a Coke 1 time with lunch and now drinks Coca-Cola with water following it.  She is trying to pull back.  Past Medical History:  Diagnosis Date  . Asthma   . Migraines    with aura  . STD (sexually transmitted disease)    chlamydia 05-29-2019 treated    Past Surgical History:  Procedure Laterality Date  . None      Current Medications: Current Meds  Medication Sig  . albuterol (PROVENTIL HFA;VENTOLIN HFA) 108 (90 Base) MCG/ACT inhaler Inhale 2 puffs into the lungs every 4 (four) hours as needed for shortness of breath.  Marland Kitchen aspirin-acetaminophen-caffeine (EXCEDRIN MIGRAINE) 250-250-65 MG tablet Take 1 tablet every 6 (six) hours as needed by mouth.  . nicotine (NICODERM CQ - DOSED IN MG/24 HOURS) 21 mg/24hr patch Place 1 patch (21 mg total) onto the skin daily.  Marland Kitchen topiramate (TOPAMAX) 25 MG tablet Take 1 tablet (25 mg total) by mouth at bedtime.      Allergies:   Penicillins   Social History   Socioeconomic History  . Marital status: Single    Spouse name: Not on file  . Number of children: Not on file  . Years of education: Not on file  . Highest education level: Not on file  Occupational History  . Not on file  Tobacco Use  . Smoking status: Current Every Day Smoker    Packs/day: 0.25    Years: 7.00    Pack years: 1.75    Types: Cigarettes  . Smokeless tobacco: Never Used  Substance and Sexual Activity  . Alcohol use: No  . Drug use: No  . Sexual activity: Yes    Partners: Male    Birth control/protection: Injection  Other Topics Concern  . Not on file  Social History Narrative  . Not on file   Social Determinants of Health   Financial Resource Strain:   . Difficulty of Paying Living Expenses:   Food Insecurity:   . Worried About Programme researcher, broadcasting/film/video in the Last Year:   . Barista in the Last Year:   Transportation Needs:   . Freight forwarder (Medical):   Marland Kitchen Lack of Transportation (Non-Medical):   Physical Activity:   . Days of Exercise per Week:   . Minutes of Exercise per  Session:   Stress:   . Feeling of Stress :   Social Connections:   . Frequency of Communication with Friends and Family:   . Frequency of Social Gatherings with Friends and Family:   . Attends Religious Services:   . Active Member of Clubs or Organizations:   . Attends Archivist Meetings:   Marland Kitchen Marital Status:      Family History: The patient's family history includes Breast cancer in her mother; Cancer in her paternal grandfather and paternal grandmother; Hypertension in her father, maternal grandmother, mother, and sister.  ROS:   Please see the history of present illness.    No fevers chills nausea vomiting syncope bleeding.  All other systems reviewed and are negative.  EKGs/Labs/Other Studies Reviewed:    The following studies were reviewed today: Prior office note reviewed.  Lab work  reviewed.  EKG:  EKG is not ordered today.  Prior EKG ECG showed sinus rhythm with nonspecific T wave changes.  These are both in the anterior and inferior leads.  Recent Labs: 10/17/2019: BUN 8; Creatinine, Ser 0.79; Hemoglobin 13.4; Platelets 261.0; Potassium 3.4; Sodium 141; TSH 1.85  Recent Lipid Panel No results found for: CHOL, TRIG, HDL, CHOLHDL, VLDL, LDLCALC, LDLDIRECT  Physical Exam:    VS:  BP 120/90   Ht 5\' 7"  (1.702 m)   Wt 276 lb (125.2 kg)   BMI 43.23 kg/m     Wt Readings from Last 3 Encounters:  10/19/19 276 lb (125.2 kg)  10/16/19 278 lb (126.1 kg)  09/20/19 270 lb 6 oz (122.6 kg)     GEN:  Well nourished, well developed in no acute distress, overweight HEENT: Normal NECK: No JVD; No carotid bruits LYMPHATICS: No lymphadenopathy CARDIAC: RRR, no murmurs, rubs, gallops RESPIRATORY:  Clear to auscultation without rales, wheezing or rhonchi  ABDOMEN: Soft, non-tender, non-distended MUSCULOSKELETAL:  No edema; No deformity  SKIN: Warm and dry NEUROLOGIC:  Alert and oriented x 3 PSYCHIATRIC:  Normal affect   ASSESSMENT:    1. Palpitations   2. Morbid obesity ([place])   3. Tobacco use    PLAN:    In order of problems listed above:  Palpitations -Likely PVCs or PACs.  We will go ahead and check a Zio patch monitor to ensure that there is no evidence of adverse arrhythmias. -I will also check an echocardiogram to ensure proper structure and function of her heart. -Lab work has been reassuring.  Normal thyroid. -She is trying to cut back on caffeine, no longer drinking coffee but she does drink an occasional Coca-Cola.  Trying to pull back. -We will go ahead and let her know the results of these tests.  Very rarely do we need to utilize beta-blockers for instance to help suppress palpitations.  Usually these can be treated conservatively.  Reassurance.  If they are PVCs or PACs, these are benign.  Tobacco use -Continue to encourage tobacco cessation.  She will  pick up the patch soon.  Morbid obesity -Continue to encourage weight loss.  Decrease sugars, carbohydrates.  Medication Adjustments/Labs and Tests Ordered: Current medicines are reviewed at length with the patient today.  Concerns regarding medicines are outlined above.  Orders Placed This Encounter  Procedures  . LONG TERM MONITOR (3-14 DAYS)  . ECHOCARDIOGRAM COMPLETE   No orders of the defined types were placed in this encounter.   Patient Instructions  Medication Instructions:  The current medical regimen is effective;  continue present plan and medications.  *If you  need a refill on your cardiac medications before your next appointment, please call your pharmacy*  Testing/Procedures: Your physician has requested that you have an echocardiogram. Echocardiography is a painless test that uses sound waves to create images of your heart. It provides your doctor with information about the size and shape of your heart and how well your heart's chambers and valves are working. This procedure takes approximately one hour. There are no restrictions for this procedure.  ZIO XT- Long Term Monitor Instructions   Your physician has requested you wear your ZIO patch monitor 14 days.   This is a single patch monitor.  Irhythm supplies one patch monitor per enrollment.  Additional stickers are not available.   Please do not apply patch if you will be having a Nuclear Stress Test, Echocardiogram, Cardiac CT, MRI, or Chest Xray during the time frame you would be wearing the monitor. The patch cannot be worn during these tests.  You cannot remove and re-apply the ZIO XT patch monitor.   Your ZIO patch monitor will be sent USPS Priority mail from [facility] directly to your home address. The monitor may also be mailed to a PO BOX if home delivery is not available.   It may take 3-5 days to receive your monitor after you have been enrolled.   Once you have received you monitor, please  review enclosed instructions.  Your monitor has already been registered assigning a specific monitor serial # to you.   Applying the monitor   Shave hair from upper left chest.   Hold abrader disc by orange tab.  Rub abrader in 40 strokes over left upper chest as indicated in your monitor instructions.   Clean area with 4 enclosed alcohol pads .  Use all pads to assure are is cleaned thoroughly.  Let dry.   Apply patch as indicated in monitor instructions.  Patch will be place under collarbone on left side of chest with arrow pointing upward.   Rub patch adhesive wings for 2 minutes.Remove white label marked "1".  Remove white label marked "2".  Rub patch adhesive wings for 2 additional minutes.   While looking in a mirror, press and release button in center of patch.  A small green light will flash 3-4 times .  This will be your only indicator the monitor has been turned on.     Do not shower for the first 24 hours.  You may shower after the first 24 hours.   Press button if you feel a symptom. You will hear a small click.  Record Date, Time and Symptom in the Patient Log Book.   When you are ready to remove patch, follow instructions on last 2 pages of Patient Log Book.  Stick patch monitor onto last page of Patient Log Book.   Place Patient Log Book in [place] box.  Use locking tab on box and tape box closed securely.  The Orange and [employer] has [employer] on it.  Please place in mailbox as soon as possible.  Your physician should have your test results approximately 7 days after the monitor has been mailed back to [facility].   Call [facility] Customer Care at [phone] if you have questions regarding your ZIO XT patch monitor.  Call them immediately if you see an orange light blinking on your monitor.   If your monitor falls off in less than 4 days contact our Monitor department at [phone].  If your monitor becomes loose or falls  off after 4 days call [employer] at  [phone] for suggestions on securing your monitor.   Follow-Up: Follow up will be determined after the results of the above testing.  Thank you for choosing [facility]!!        Signed, Donato Schultz, MD  10/19/2019 4:38 PM    [facility]

## 2019-10-19 NOTE — Patient Instructions (Signed)
Medication Instructions:  The current medical regimen is effective;  continue present plan and medications.  *If you need a refill on your cardiac medications before your next appointment, please call your pharmacy*  Testing/Procedures: Your physician has requested that you have an echocardiogram. Echocardiography is a painless test that uses sound waves to create images of your heart. It provides your doctor with information about the size and shape of your heart and how well your heart's chambers and valves are working. This procedure takes approximately one hour. There are no restrictions for this procedure.  ZIO XT- Long Term Monitor Instructions   Your physician has requested you wear your ZIO patch monitor 14 days.   This is a single patch monitor.  Irhythm supplies one patch monitor per enrollment.  Additional stickers are not available.   Please do not apply patch if you will be having a Nuclear Stress Test, Echocardiogram, Cardiac CT, MRI, or Chest Xray during the time frame you would be wearing the monitor. The patch cannot be worn during these tests.  You cannot remove and re-apply the ZIO XT patch monitor.   Your ZIO patch monitor will be sent USPS Priority mail from [facility] directly to your home address. The monitor may also be mailed to a PO BOX if home delivery is not available.   It may take 3-5 days to receive your monitor after you have been enrolled.   Once you have received you monitor, please review enclosed instructions.  Your monitor has already been registered assigning a specific monitor serial # to you.   Applying the monitor   Shave hair from upper left chest.   Hold abrader disc by orange tab.  Rub abrader in 40 strokes over left upper chest as indicated in your monitor instructions.   Clean area with 4 enclosed alcohol pads .  Use all pads to assure are is cleaned thoroughly.  Let dry.   Apply patch as indicated in monitor instructions.  Patch  will be place under collarbone on left side of chest with arrow pointing upward.   Rub patch adhesive wings for 2 minutes.Remove white label marked "1".  Remove white label marked "2".  Rub patch adhesive wings for 2 additional minutes.   While looking in a mirror, press and release button in center of patch.  A small green light will flash 3-4 times .  This will be your only indicator the monitor has been turned on.     Do not shower for the first 24 hours.  You may shower after the first 24 hours.   Press button if you feel a symptom. You will hear a small click.  Record Date, Time and Symptom in the Patient Log Book.   When you are ready to remove patch, follow instructions on last 2 pages of Patient Log Book.  Stick patch monitor onto last page of Patient Log Book.   Place Patient Log Book in [place] box.  Use locking tab on box and tape box closed securely.  The Orange and [employer] has [employer] on it.  Please place in mailbox as soon as possible.  Your physician should have your test results approximately 7 days after the monitor has been mailed back to [facility].   Call [facility] at [phone] if you have questions regarding your ZIO XT patch monitor.  Call them immediately if you see an orange light blinking on your monitor.   If your monitor falls off in  less than 4 days contact our Monitor department at [phone].  If your monitor becomes loose or falls off after 4 days call [employer] at [phone] for suggestions on securing your monitor.   Follow-Up: Follow up will be determined after the results of the above testing.  Thank you for choosing [facility]!!

## 2019-10-20 ENCOUNTER — Other Ambulatory Visit (INDEPENDENT_AMBULATORY_CARE_PROVIDER_SITE_OTHER): Payer: No Typology Code available for payment source

## 2019-10-20 DIAGNOSIS — R002 Palpitations: Secondary | ICD-10-CM

## 2019-10-31 ENCOUNTER — Ambulatory Visit
Admission: EM | Admit: 2019-10-31 | Discharge: 2019-10-31 | Disposition: A | Discharge status: Home / Self Care | Payer: No Typology Code available for payment source | Attending: Physician Assistant | Admitting: Physician Assistant

## 2019-10-31 DIAGNOSIS — R0789 Other chest pain: Secondary | ICD-10-CM | POA: Diagnosis not present

## 2019-10-31 MED ORDER — FAMOTIDINE 20 MG PO TABS: 20.0000 mg | ORAL_TABLET | Freq: Two times a day (BID) | ORAL | 0 refills | Status: DC

## 2019-10-31 MED ORDER — HYDROXYZINE HCL 25 MG PO TABS: 25.0000 mg | ORAL_TABLET | Freq: Four times a day (QID) | ORAL | 0 refills | Status: DC | PRN

## 2019-10-31 NOTE — ED Triage Notes (Signed)
Pt c/o lt sided chest pain/pressure after waking this am. Denies pain radiating. Denies n/v/SOB/diaphroesis. States wearing a heart monitor for her palpitations. Pt denies palpitations at this time.

## 2019-10-31 NOTE — ED Provider Notes (Signed)
[name]    CSN: [id number] Arrival date & time: 10/31/19  9678      History   Chief Complaint Chief Complaint  Patient presents with  . Chest Pain    HPI Frances Murphy is a 29 y.o. female.   29 year old female with history of asthma, migraine, comes in for left sided  Chest pain/presure after waking up this morning. Denies radiation of pain. Denies associated symptoms such as nausea, vomiting, shortness of breath, diaphoresis. Denies palpitations, weakness, dizziness. Denies URI symptoms, asthma symptoms. Denies exertional chest pain, exertional fatigue, syncope. She is being evaluated for palpitations by cardiology and current has an event monitor on. Denies palpitations with current symptoms, states palpitations happen usually at night when laying down. Denies abdominal pain. Denies personal or family history of heart disease. Does endorse increased stress in life, and thinks this maybe anxiety/stress related.      Past Medical History:  Diagnosis Date  . Asthma   . Migraines    with aura  . STD (sexually transmitted disease)    chlamydia 05-29-2019 treated    Patient Active Problem List   Diagnosis Date Noted  . Preterm delivery, delivered 04/13/2013  . Preterm premature rupture of membranes 02/01/2013  . Premature labor 01/26/2013  . 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 Asthma 11/02/2012  . Morbid obesity (HCC) 11/02/2012  . Family history of breast cancer in mother 11/02/2012  . Current smoker 11/02/2012    Past Surgical History:  Procedure Laterality Date  . None      OB History    Gravida  1   Para  1   Term  0   Preterm  1   AB  0   Living  0     SAB  0   TAB  0   Ectopic  0   Multiple  0   Live Births  1            Home Medications    Prior to Admission medications   Medication Sig Start Date End Date Taking? Authorizing Provider  nicotine (NICODERM CQ - DOSED IN MG/24 HOURS) 21 mg/24hr patch Place 1  patch (21 mg total) onto the skin daily. 09/20/19  Yes [place], Betty G, MD  topiramate (TOPAMAX) 25 MG tablet Take 1 tablet (25 mg total) by mouth at bedtime. 10/16/19  Yes [place], Betty G, MD  albuterol (PROVENTIL HFA;VENTOLIN HFA) 108 (90 Base) MCG/ACT inhaler Inhale 2 puffs into the lungs every 4 (four) hours as needed for shortness of breath. 07/17/16   Mesner, Barbara Cower, MD  aspirin-acetaminophen-caffeine (EXCEDRIN MIGRAINE) [phone] MG tablet Take 1 tablet every 6 (six) hours as needed by mouth. 06/11/17   Ward, Chase Picket, PA-C  famotidine (PEPCID) 20 MG tablet Take 1 tablet (20 mg total) by mouth 2 (two) times daily. 10/31/19   Cathie Hoops, Terrea Bruster V, PA-C  hydrOXYzine (ATARAX/VISTARIL) 25 MG tablet Take 1 tablet (25 mg total) by mouth every 6 (six) hours as needed for anxiety. 10/31/19   Cathie Hoops, Blayne Garlick V, PA-C  fluticasone (FLONASE) 50 MCG/ACT nasal spray Place 2 sprays into both nostrils daily. 02/07/19 04/19/19  Belinda Fisher, PA-C    Family History Family History  Problem Relation Age of Onset  . Hypertension Mother   . Breast cancer Mother   . Hypertension Father   . Hypertension Sister   . Hypertension Maternal Grandmother   . Cancer Paternal Grandmother   . Cancer Paternal Grandfather  Social History Social History   Tobacco Use  . Smoking status: Current Every Day Smoker    Packs/day: 0.25    Years: 7.00    Pack years: 1.75    Types: Cigarettes  . Smokeless tobacco: Never Used  Substance Use Topics  . Alcohol use: No  . Drug use: No     Allergies   Penicillins   Review of Systems Review of Systems  Reason unable to perform ROS: See HPI as above.     Physical Exam Triage Vital Signs ED Triage Vitals  Enc Vitals Group     BP 10/31/19 0954 (!) 154/98     Pulse Rate 10/31/19 0954 95     Resp 10/31/19 0954 16     Temp 10/31/19 0954 97.7 F (36.5 C)     Temp Source 10/31/19 0954 Oral     SpO2 10/31/19 0954 97 %     Weight --      Height --      Head Circumference --       Peak Flow --      Pain Score 10/31/19 0955 6     Pain Loc --      Pain Edu? --      Excl. in [place]? --    No data found.  Updated Vital Signs BP (!) 154/98 (BP Location: Left Arm)   Pulse 95   Temp 97.7 F (36.5 C) (Oral)   Resp 16   SpO2 97%   Physical Exam Constitutional:      General: She is not in acute distress.    Appearance: Normal appearance. She is well-developed. She is not toxic-appearing or diaphoretic.  HENT:     Head: Normocephalic and atraumatic.  Eyes:     Conjunctiva/sclera: Conjunctivae normal.     Pupils: Pupils are equal, round, and reactive to light.  Cardiovascular:     Rate and Rhythm: Normal rate and regular rhythm.     Heart sounds: Normal heart sounds. No murmur. No friction rub. No gallop.   Pulmonary:     Effort: Pulmonary effort is normal. No respiratory distress.     Comments: Speaking in full sentences without difficulty Chest:     Chest wall: No tenderness.  Abdominal:     Palpations: Abdomen is soft.     Tenderness: There is no abdominal tenderness. There is no guarding or rebound.  Musculoskeletal:     Cervical back: Normal range of motion and neck supple.  Skin:    General: Skin is warm and dry.  Neurological:     Mental Status: She is alert and oriented to person, place, and time.      UC Treatments / Results  Labs (all labs ordered are listed, but only abnormal results are displayed) Labs Reviewed - No data to display  EKG   Radiology No results found.  Procedures Procedures (including critical care time)  Medications Ordered in UC Medications - No data to display  Initial Impression / Assessment and Plan / UC Course  I have reviewed the triage vital signs and the nursing notes.  Pertinent labs & imaging results that were available during my care of the patient were reviewed by me and considered in my medical decision making (see chart for details).    No alarming signs on exam. EKG NSR, 84 bpm, lead III, aVF T  wave inversion, unchanged from prior except for increased artifacts. Currently with event monitor per cardiology for palpitations. Discussed possible causes such as  GERD, anxiety causing symptoms. Discussed trying pepcid for GERD and hydroxyzine for anxiety. Discussed starting one medication, and waiting 4-5 days prior to trying the other if symptoms not improving. Return precautions given. Otherwise, follow up with PCP/cardiology for further evaluation if symptoms not improving.  Final Clinical Impressions(s) / UC Diagnoses   Final diagnoses:  Atypical chest pain   ED Prescriptions    Medication Sig Dispense Auth. Provider   hydrOXYzine (ATARAX/VISTARIL) 25 MG tablet Take 1 tablet (25 mg total) by mouth every 6 (six) hours as needed for anxiety. 16 tablet Jacoba Cherney V, PA-C   famotidine (PEPCID) 20 MG tablet Take 1 tablet (20 mg total) by mouth 2 (two) times daily. 30 tablet Belinda Fisher, PA-C     PDMP not reviewed this encounter.   Belinda Fisher, PA-C 10/31/19 1021

## 2019-10-31 NOTE — Discharge Instructions (Addendum)
EKG without changes. Start pepcid for GERD or hydroxyzine for anxiety. If symptoms not improving after 4-5 days, started the other medicine so we can determine which medicine is or is not working. Continue to monitor symptoms, and update cardiologist of current symptoms. If sudden worsening of chest pain with shortness of breath, weakness, nausea/vomiting, sweating, go to the emergency department for further evaluation. Otherwise, follow up with PCP/cardiology for reevaluation.

## 2019-11-01 ENCOUNTER — Telehealth (INDEPENDENT_AMBULATORY_CARE_PROVIDER_SITE_OTHER): Payer: No Typology Code available for payment source | Admitting: Family Medicine

## 2019-11-01 ENCOUNTER — Encounter: Payer: Self-pay | Admitting: Family Medicine

## 2019-11-01 ENCOUNTER — Telehealth: Payer: No Typology Code available for payment source | Admitting: Family Medicine

## 2019-11-01 VITALS — Ht 67.0 in | Wt 276.0 lb

## 2019-11-01 DIAGNOSIS — R079 Chest pain, unspecified: Secondary | ICD-10-CM

## 2019-11-01 DIAGNOSIS — R030 Elevated blood-pressure reading, without diagnosis of hypertension: Secondary | ICD-10-CM

## 2019-11-01 NOTE — Progress Notes (Signed)
Virtual Visit via Video Note   I connected with Frances Murphy on 11/01/19 by a video enabled telemedicine application and verified that I am speaking with the correct person using two identifiers.  Location patient: home Location provider:work office Persons participating in the virtual visit: patient, provider  I discussed the limitations of evaluation and management by telemedicine and the availability of in person appointments. The patient expressed understanding and agreed to proceed.   HPI: Frances Murphy is a 29 yo female with hx of migraines and allergies following on her last OV. She was last seen on 10/16/2019, when she was complaining about chest pounding sensation and palpitations. Because of abnormal EKG, she was referred to cardiologist. BP has been elevated. She has not checked BP at home.  During recent ER visit BP was 154/98.  Currently she is wearing a heart monitor and she has an appointment for an echocardiogram. Yesterday she was seen in the ER because of chest pain.  Chest pain was noted yesterday when she first woke up. Pressure-like chest pain, no radiated. No associated dyspnea, palpitations, cough,wheezing, or diaphoresis.  Negative for fever,chills,sore throat,dysphagia, heartburn, burping, nausea, vomiting, or abdominal pain. The night before pain started she ate supper later than usual and went to bed.  It was recommended to take Pepcid 20 mg twice daily and hydroxyzine 25 mg twice daily as needed for possible anxiety. She has not started medications yet.  She is feeling better today. Still smoking but decreasing amount of cigarettes per day.  ROS: See pertinent positives and negatives per HPI.  Past Medical History:  Diagnosis Date  . Asthma   . Migraines    with aura  . STD (sexually transmitted disease)    chlamydia 05-29-2019 treated    Past Surgical History:  Procedure Laterality Date  . None      Family History  Problem Relation Age of  Onset  . Hypertension Mother   . Breast cancer Mother   . Hypertension Father   . Hypertension Sister   . Hypertension Maternal Grandmother   . Cancer Paternal Grandmother   . Cancer Paternal Grandfather     Social History   Socioeconomic History  . Marital status: Single    Spouse name: Not on file  . Number of children: Not on file  . Years of education: Not on file  . Highest education level: Not on file  Occupational History  . Not on file  Tobacco Use  . Smoking status: Current Every Day Smoker    Packs/day: 0.25    Years: 7.00    Pack years: 1.75    Types: Cigarettes  . Smokeless tobacco: Never Used  Substance and Sexual Activity  . Alcohol use: No  . Drug use: No  . Sexual activity: Yes    Partners: Male    Birth control/protection: Injection  Other Topics Concern  . Not on file  Social History Narrative  . Not on file   Social Determinants of Health   Financial Resource Strain:   . Difficulty of Paying Living Expenses:   Food Insecurity:   . Worried About Charity fundraiser in the Last Year:   . Arboriculturist in the Last Year:   Transportation Needs:   . Film/video editor (Medical):   Marland Kitchen Lack of Transportation (Non-Medical):   Physical Activity:   . Days of Exercise per Week:   . Minutes of Exercise per Session:   Stress:   . Feeling of Stress :  Social Connections:   . Frequency of Communication with Friends and Family:   . Frequency of Social Gatherings with Friends and Family:   . Attends Religious Services:   . Active Member of Clubs or Organizations:   . Attends Banker Meetings:   Marland Kitchen Marital Status:   Intimate Partner Violence:   . Fear of Current or Ex-Partner:   . Emotionally Abused:   Marland Kitchen Physically Abused:   . Sexually Abused:     Current Outpatient Medications:  .  albuterol (PROVENTIL HFA;VENTOLIN HFA) 108 (90 Base) MCG/ACT inhaler, Inhale 2 puffs into the lungs every 4 (four) hours as needed for shortness of  breath., Disp: 1 Inhaler, Rfl: 0 .  aspirin-acetaminophen-caffeine (EXCEDRIN MIGRAINE) 250-250-65 MG tablet, Take 1 tablet every 6 (six) hours as needed by mouth., Disp: 30 tablet, Rfl: 0 .  hydrOXYzine (ATARAX/VISTARIL) 25 MG tablet, Take 1 tablet (25 mg total) by mouth every 6 (six) hours as needed for anxiety., Disp: 16 tablet, Rfl: 0 .  nicotine (NICODERM CQ - DOSED IN MG/24 HOURS) 21 mg/24hr patch, Place 1 patch (21 mg total) onto the skin daily., Disp: 28 patch, Rfl: 0 .  famotidine (PEPCID) 20 MG tablet, Take 1 tablet (20 mg total) by mouth 2 (two) times daily. (Patient not taking: Reported on 11/01/2019), Disp: 30 tablet, Rfl: 0 .  topiramate (TOPAMAX) 25 MG tablet, Take 1 tablet (25 mg total) by mouth at bedtime. (Patient not taking: Reported on 11/01/2019), Disp: 30 tablet, Rfl: 1  EXAM:  VITALS per patient if applicable:Ht 5\' 7"  (1.702 m)   Wt 276 lb (125.2 kg)   BMI 43.23 kg/m   GENERAL: alert, oriented, appears well and in no acute distress  HEENT: atraumatic, conjunctiva clear, no obvious abnormalities on inspection.  LUNGS: on inspection no signs of respiratory distress, breathing rate appears normal, no obvious gross SOB, gasping or wheezing  CV: no obvious cyanosis  Frances: moves all visible extremities without noticeable abnormality. Chest pressure elicited when she elevated UE's.  PSYCH/NEURO: pleasant and cooperative, no obvious depression or anxiety, speech and thought processing grossly intact  ASSESSMENT AND PLAN:  Discussed the following assessment and plan:  Elevated blood pressure reading We discussed possible complications of elevated BP. She will start checking BP at home. Low salt diet to continue. She has an appointment for echo and to see a cardiologist after cardiac work-up is completed.  Chest pain, unspecified type Resolved. We discussed possible etiologies. Based on history and CV risk factors the probability of this being cardiac related is  low.  Recommend decreasing meals portion,eating a light supper, and waiting at least 3 hours before going to bed. Instructed about warning signs.    I discussed the assessment and treatment plan with the patient. Frances Murphy was provided an opportunity to ask questions and all were answered. She agreed with the plan and demonstrated an understanding of the instructions.  Return if symptoms worsen or fail to improve, for After cardiology f/u if needed and depending of BP readings..    Kevonte Vanecek Sharol Harness, MD

## 2019-11-08 ENCOUNTER — Other Ambulatory Visit (HOSPITAL_COMMUNITY): Payer: No Typology Code available for payment source

## 2019-11-10 ENCOUNTER — Other Ambulatory Visit: Payer: Self-pay

## 2019-11-10 ENCOUNTER — Ambulatory Visit (HOSPITAL_COMMUNITY): Payer: No Typology Code available for payment source | Attending: Cardiology

## 2019-11-10 DIAGNOSIS — R002 Palpitations: Secondary | ICD-10-CM | POA: Diagnosis not present

## 2019-11-17 ENCOUNTER — Other Ambulatory Visit: Payer: Self-pay

## 2019-11-20 ENCOUNTER — Ambulatory Visit: Payer: No Typology Code available for payment source

## 2019-11-21 ENCOUNTER — Ambulatory Visit (INDEPENDENT_AMBULATORY_CARE_PROVIDER_SITE_OTHER): Payer: Self-pay

## 2019-11-21 ENCOUNTER — Other Ambulatory Visit: Payer: Self-pay

## 2019-11-21 VITALS — BP 138/80 | HR 88 | Temp 98.0°F | Resp 10 | Ht 67.0 in | Wt 273.0 lb

## 2019-11-21 DIAGNOSIS — Z3042 Encounter for surveillance of injectable contraceptive: Secondary | ICD-10-CM | POA: Diagnosis not present

## 2019-11-21 MED ORDER — MEDROXYPROGESTERONE ACETATE 150 MG/ML IM SUSP
150.0000 mg | Freq: Once | INTRAMUSCULAR | Status: AC
  Administered 2019-11-21: 150 mg via INTRAMUSCULAR

## 2019-11-21 NOTE — Progress Notes (Signed)
Patient is here for Depo Provera Injection Patient is within Depo Provera Calender Limits yes Next Depo Due between: 7/6-7/20 Last AEX: 05/29/19 DL AEX Scheduled: none scheduled  Patient is aware when next depo is due  Pt tolerated Injection well in LUOQ.  Routed to provider for review, encounter closed.

## 2019-11-29 ENCOUNTER — Encounter: Payer: Self-pay | Admitting: *Deleted

## 2020-01-10 ENCOUNTER — Ambulatory Visit
Admission: EM | Admit: 2020-01-10 | Discharge: 2020-01-10 | Disposition: A | Discharge status: Home / Self Care | Payer: No Typology Code available for payment source | Attending: Emergency Medicine | Admitting: Emergency Medicine

## 2020-01-10 ENCOUNTER — Other Ambulatory Visit: Payer: Self-pay

## 2020-01-10 DIAGNOSIS — G43109 Migraine with aura, not intractable, without status migrainosus: Secondary | ICD-10-CM

## 2020-01-10 MED ORDER — KETOROLAC TROMETHAMINE 30 MG/ML IJ SOLN
30.0000 mg | Freq: Once | INTRAMUSCULAR | Status: AC
  Administered 2020-01-10: 30 mg via INTRAMUSCULAR

## 2020-01-10 MED ORDER — ONDANSETRON 4 MG PO TBDP
4.0000 mg | ORAL_TABLET | Freq: Once | ORAL | Status: AC
  Administered 2020-01-10: 4 mg via ORAL

## 2020-01-10 MED ORDER — DEXAMETHASONE SODIUM PHOSPHATE 10 MG/ML IJ SOLN
10.0000 mg | Freq: Once | INTRAMUSCULAR | Status: AC
  Administered 2020-01-10: 10 mg via INTRAMUSCULAR

## 2020-01-10 NOTE — ED Provider Notes (Signed)
[name]    CSN: [id number] Arrival date & time: 01/10/20  1048      History   Chief Complaint Chief Complaint  Patient presents with  . Migraine    HPI Frances Murphy is a 29 y.o. female with history of asthma, migraines presenting for migraine since yesterday.  Patient endorsing photophobia.  Denies worst headache of her life, thunderclap headache.  States it feels like her typical migraine: Last was a few months ago.  No vomiting, chest pain, difficulty breathing, neck pain or head injury.   Past Medical History:  Diagnosis Date  . Asthma   . Migraines    with aura  . STD (sexually transmitted disease)    chlamydia 05-29-2019 treated    Patient Active Problem List   Diagnosis Date Noted  . Preterm delivery, delivered 04/13/2013  . Preterm premature rupture of membranes 02/01/2013  . Premature labor 01/26/2013  . 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 Asthma 11/02/2012  . Morbid obesity ([place]) 11/02/2012  . Family history of breast cancer in mother 11/02/2012  . Current smoker 11/02/2012    Past Surgical History:  Procedure Laterality Date  . None      OB History    Gravida  1   Para  1   Term  0   Preterm  1   AB  0   Living  0     SAB  0   TAB  0   Ectopic  0   Multiple  0   Live Births  1            Home Medications    Prior to Admission medications   Medication Sig Start Date End Date Taking? Authorizing Provider  albuterol (PROVENTIL HFA;VENTOLIN HFA) 108 (90 Base) MCG/ACT inhaler Inhale 2 puffs into the lungs every 4 (four) hours as needed for shortness of breath. 07/17/16   Mesner, Corene Cornea, MD  aspirin-acetaminophen-caffeine (EXCEDRIN MIGRAINE) [phone] MG tablet Take 1 tablet every 6 (six) hours as needed by mouth. 06/11/17   Ward, Ozella Almond, PA-C  famotidine (PEPCID) 20 MG tablet Take 1 tablet (20 mg total) by mouth 2 (two) times daily. 10/31/19   Tasia Catchings, Amy V, PA-C  fluticasone (FLONASE) 50 MCG/ACT nasal  spray Place 2 sprays into both nostrils daily. 02/07/19 04/19/19  Ok Edwards, PA-C    Family History Family History  Problem Relation Age of Onset  . Hypertension Mother   . Breast cancer Mother   . Hypertension Father   . Hypertension Sister   . Hypertension Maternal Grandmother   . Cancer Paternal Grandmother   . Cancer Paternal Grandfather     Social History Social History   Tobacco Use  . Smoking status: Current Every Day Smoker    Packs/day: 0.25    Years: 7.00    Pack years: 1.75    Types: Cigarettes  . Smokeless tobacco: Never Used  Substance Use Topics  . Alcohol use: No  . Drug use: No     Allergies   Penicillins   Review of Systems As per HPI   Physical Exam Triage Vital Signs ED Triage Vitals  Enc Vitals Group     BP      Pulse      Resp      Temp      Temp src      SpO2      Weight      Height      Head Circumference  Peak Flow      Pain Score      Pain Loc      Pain Edu?      Excl. in GC?    No data found.  Updated Vital Signs BP (!) 131/95 (BP Location: Right Arm)   Pulse 79   Temp 98.2 F (36.8 C) (Oral)   Resp 18   SpO2 96%   Visual Acuity Right Eye Distance:   Left Eye Distance:   Bilateral Distance:    Right Eye Near:   Left Eye Near:    Bilateral Near:     Physical Exam Constitutional:      General: She is not in acute distress. HENT:     Head: Normocephalic and atraumatic.  Eyes:     General: No scleral icterus.    Pupils: Pupils are equal, round, and reactive to light.  Cardiovascular:     Rate and Rhythm: Normal rate.  Pulmonary:     Effort: Pulmonary effort is normal.  Skin:    Coloration: Skin is not jaundiced or pale.  Neurological:     General: No focal deficit present.     Mental Status: She is alert and oriented to person, place, and time.      UC Treatments / Results  Labs (all labs ordered are listed, but only abnormal results are displayed) Labs Reviewed - No data to  display  EKG   Radiology No results found.  Procedures Procedures (including critical care time)  Medications Ordered in UC Medications  ketorolac (TORADOL) 30 MG/ML injection 30 mg (30 mg Intramuscular Given 01/10/20 1140)  dexamethasone (DECADRON) injection 10 mg (10 mg Intramuscular Given 01/10/20 1140)  ondansetron (ZOFRAN-ODT) disintegrating tablet 4 mg (4 mg Oral Given 01/10/20 1139)    Initial Impression / Assessment and Plan / UC Course  I have reviewed the triage vital signs and the nursing notes.  Pertinent labs & imaging results that were available during my care of the patient were reviewed by me and considered in my medical decision making (see chart for details).     Patient febrile, nontoxic, and hemodynamically stable in office.  No neurocognitive deficit today.  Given headache cocktail in office which he tolerated well.  Return precautions discussed, patient verbalized understanding and is agreeable to plan. Final Clinical Impressions(s) / UC Diagnoses   Final diagnoses:  Migraine with aura and without status migrainosus, not intractable     Discharge Instructions     Today you were given medications for migraine. Important to keep a headache log and follow-up with PCP for further evaluation if needed. Go to ER for worse headache of life, change in vision or hearing, weakness, dizziness, vomiting, chest pain.    ED Prescriptions    None     PDMP not reviewed this encounter.   [place], [place], [place] 01/10/20 1306

## 2020-01-10 NOTE — ED Triage Notes (Signed)
Pt c/o migraine headache since yesterday. Denies nausea, having light sensitivity and blurred vision

## 2020-01-10 NOTE — Discharge Instructions (Signed)
Today you were given medications for migraine. Important to keep a headache log and follow-up with PCP for further evaluation if needed. Go to ER for worse headache of life, change in vision or hearing, weakness, dizziness, vomiting, chest pain.

## 2020-02-06 ENCOUNTER — Ambulatory Visit: Payer: No Typology Code available for payment source

## 2020-02-07 ENCOUNTER — Telehealth: Payer: Self-pay | Admitting: Obstetrics and Gynecology

## 2020-02-07 NOTE — Telephone Encounter (Signed)
Disregard made in eror

## 2020-02-08 ENCOUNTER — Ambulatory Visit: Payer: No Typology Code available for payment source

## 2020-02-12 ENCOUNTER — Telehealth: Payer: Self-pay

## 2020-02-12 NOTE — Telephone Encounter (Signed)
Patient is returning call to [facility] in regards to update information.

## 2020-02-15 ENCOUNTER — Ambulatory Visit: Payer: No Typology Code available for payment source

## 2020-02-26 ENCOUNTER — Ambulatory Visit (INDEPENDENT_AMBULATORY_CARE_PROVIDER_SITE_OTHER): Payer: Self-pay | Admitting: Family Medicine

## 2020-02-26 ENCOUNTER — Encounter: Payer: Self-pay | Admitting: Family Medicine

## 2020-02-26 ENCOUNTER — Other Ambulatory Visit: Payer: Self-pay

## 2020-02-26 VITALS — BP 126/80 | HR 84 | Resp 12 | Ht 67.0 in | Wt 265.0 lb

## 2020-02-26 DIAGNOSIS — R519 Headache, unspecified: Secondary | ICD-10-CM

## 2020-02-26 DIAGNOSIS — F17200 Nicotine dependence, unspecified, uncomplicated: Secondary | ICD-10-CM

## 2020-02-26 DIAGNOSIS — Z3042 Encounter for surveillance of injectable contraceptive: Secondary | ICD-10-CM

## 2020-02-26 LAB — POCT URINE PREGNANCY: Preg Test, Ur: NEGATIVE

## 2020-02-26 MED ORDER — FOLIC ACID 1 MG PO TABS: 1.0000 mg | ORAL_TABLET | Freq: Every day | ORAL | 2 refills | Status: DC

## 2020-02-26 MED ORDER — MEDROXYPROGESTERONE ACETATE 150 MG/ML IM SUSP
150.0000 mg | Freq: Once | INTRAMUSCULAR | Status: AC
  Administered 2020-02-26: 150 mg via INTRAMUSCULAR

## 2020-02-26 NOTE — Progress Notes (Signed)
Chief Complaint  Patient presents with  . Contraception   HPI: [name] is a 28 y.o. female, who is here today requesting Depo Provera injection.  She was last seen on 11/01/19 for elevated BP,video visit.  She is established with gyn, last Depo injection on 11/21/19. She has been on Depo Provera on and off since age 18. Resumed Depo about 2 years ago. She is planning on trying to get pregnant in 3-4 months.  Negative for pelvic pain,vaginal bleeding or discharge. Still smoking, trying to decrease number of cig/day, smoking 5-6 cig daily.  She is not exercising regularly, trying to watch her diet. She has lost some wt.  -Headaches, bitemporal and occipital. Hx of migraine with aura. No associated nausea,vomiting,or focal weakness. + Photophobia.  Last episodes in 01/2020 when she was evaluated in the ER. Evaluated in the ER on 01/10/20. She takes Excedrin migraines for acute episodes.  Review of Systems  Constitutional: Negative for activity change, appetite change, fatigue and fever.  Respiratory: Negative for cough, shortness of breath and wheezing.   Cardiovascular: Negative for chest pain, palpitations and leg swelling.  Gastrointestinal: Negative for abdominal pain.       Negative for changes in bowel habits.  Genitourinary: Negative for decreased urine volume, dysuria and hematuria.  Neurological: Negative for syncope, facial asymmetry and weakness.  Rest of ROS, see pertinent positives sand negatives in HPI  Current Outpatient Medications on File Prior to Visit  Medication Sig Dispense Refill  . albuterol (PROVENTIL HFA;VENTOLIN HFA) 108 (90 Base) MCG/ACT inhaler Inhale 2 puffs into the lungs every 4 (four) hours as needed for shortness of breath. 1 Inhaler 0  . aspirin-acetaminophen-caffeine (EXCEDRIN MIGRAINE) 250-250-65 MG tablet Take 1 tablet every 6 (six) hours as needed by mouth. 30 tablet 0  . [DISCONTINUED] fluticasone (FLONASE) 50 MCG/ACT nasal  spray Place 2 sprays into both nostrils daily. 1 g 0   No current facility-administered medications on file prior to visit.   Past Medical History:  Diagnosis Date  . Asthma   . Migraines    with aura  . STD (sexually transmitted disease)    chlamydia 05-29-2019 treated   Allergies  Allergen Reactions  . Penicillins     bumps    Social History   Socioeconomic History  . Marital status: Single    Spouse name: Not on file  . Number of children: Not on file  . Years of education: Not on file  . Highest education level: Not on file  Occupational History  . Not on file  Tobacco Use  . Smoking status: Current Every Day Smoker    Packs/day: 0.25    Years: 7.00    Pack years: 1.75    Types: Cigarettes  . Smokeless tobacco: Never Used  Vaping Use  . Vaping Use: Never used  Substance and Sexual Activity  . Alcohol use: No  . Drug use: No  . Sexual activity: Yes    Partners: Male    Birth control/protection: Injection  Other Topics Concern  . Not on file  Social History Narrative  . Not on file   Social Determinants of Health   Financial Resource Strain:   . Difficulty of Paying Living Expenses:   Food Insecurity:   . Worried About Programme researcher, broadcasting/film/video in the Last Year:   . Barista in the Last Year:   Transportation Needs:   . Freight forwarder (Medical):   Marland Kitchen Lack of Transportation (  Non-Medical):   Physical Activity:   . Days of Exercise per Week:   . Minutes of Exercise per Session:   Stress:   . Feeling of Stress :   Social Connections:   . Frequency of Communication with Friends and Family:   . Frequency of Social Gatherings with Friends and Family:   . Attends Religious Services:   . Active Member of Clubs or Organizations:   . Attends Banker Meetings:   Marland Kitchen Marital Status:     Vitals:   02/26/20 1630  BP: 126/80  Pulse: 84  Resp: 12   Wt Readings from Last 3 Encounters:  02/26/20 (!) 265 lb (120.2 kg)  11/21/19 273 lb  (123.8 kg)  11/01/19 276 lb (125.2 kg)    Body mass index is 41.5 kg/m.  Physical Exam Vitals and nursing note reviewed.  Constitutional:      General: She is not in acute distress.    Appearance: She is well-developed.  HENT:     Head: Normocephalic and atraumatic.  Eyes:     Conjunctiva/sclera: Conjunctivae normal.  Cardiovascular:     Rate and Rhythm: Normal rate and regular rhythm.     Heart sounds: No murmur heard.   Pulmonary:     Effort: Pulmonary effort is normal. No respiratory distress.     Breath sounds: Normal breath sounds.  Abdominal:     Palpations: Abdomen is soft. There is no hepatomegaly or mass.     Tenderness: There is no abdominal tenderness.  Lymphadenopathy:     Cervical: No cervical adenopathy.  Skin:    General: Skin is warm.     Findings: No erythema or rash.  Neurological:     Mental Status: She is alert and oriented to person, place, and time.     Cranial Nerves: No cranial nerve deficit.     Gait: Gait normal.  Psychiatric:     Comments: Well groomed, good eye contact.     ASSESSMENT AND PLAN:  [name] was seen today for contraception  months follow-up.  Orders Placed This Encounter  Procedures  . POCT urine pregnancy    Tobacco use disorder We discussed the importance of smoking cessation. She does not feel like she is ready to quit now, planning on continuing  Nicotine patches are not covered by her health insurance and she would like to hold on Chantix.  Encounter for surveillance of injectable contraceptive After verbal consent and negative pregnancy test, she received dose of Depo provera. Continue following with gyn. Daily folic acid recommended.  -     POCT urine pregnancy -     medroxyPROGESTERone (DEPO-PROVERA) injection 150 mg  Headache, unspecified headache type ? Tension HA. Because she is planning on getting pregnant in 3 months, I am not recommending prophylactic treatment. Continue OTC Excedrin  migraine daily as needed.  Morbid obesity (HCC) Since 10/2019 she has lost about 11 Lb. Encouraged to continue working on her diet and to engage in regular physical activity.  Other orders -     folic acid (FOLVITE) 1 MG tablet; Take 1 tablet (1 mg total) by mouth daily.    Return As needed..   Camry Theiss G. [place], MD  [facility]. [place] office.   A few things to remember from today's visit:  Work hard on smoking cessation, it is not easy. If you decide to try Chantix,please let me know. Continue working on wt loss, daily physical activity (15-30 min walking) and counting calories (  1800 cal/day).  Daily folic acid.  If you need refills please call your pharmacy. Do not use My Chart to request refills or for acute issues that need immediate attention.    Please be sure medication list is accurate. If a new problem present, please set up appointment sooner than planned today.

## 2020-02-26 NOTE — Patient Instructions (Addendum)
A few things to remember from today's visit:  Work hard on smoking cessation, it is not easy. If you decide to try Chantix,please let me know. Continue working on wt loss, daily physical activity (15-30 min walking) and counting calories (1800 cal/day).  Daily folic acid.  If you need refills please call your pharmacy. Do not use My Chart to request refills or for acute issues that need immediate attention.    Please be sure medication list is accurate. If a new problem present, please set up appointment sooner than planned today.

## 2020-04-10 ENCOUNTER — Encounter: Payer: Self-pay | Admitting: Family Medicine

## 2020-04-10 ENCOUNTER — Telehealth (INDEPENDENT_AMBULATORY_CARE_PROVIDER_SITE_OTHER): Payer: 59 | Admitting: Family Medicine

## 2020-04-10 ENCOUNTER — Other Ambulatory Visit: Payer: Self-pay

## 2020-04-10 ENCOUNTER — Ambulatory Visit: Admit: 2020-04-10 | Discharge status: Against Medical Advice | Payer: 59 | Source: Home / Self Care

## 2020-04-10 ENCOUNTER — Telehealth: Payer: 59 | Admitting: Internal Medicine

## 2020-04-10 ENCOUNTER — Ambulatory Visit: Admit: 2020-04-10 | Discharge status: Against Medical Advice | Payer: 59

## 2020-04-10 VITALS — HR 76 | Ht 67.0 in

## 2020-04-10 DIAGNOSIS — J4520 Mild intermittent asthma, uncomplicated: Secondary | ICD-10-CM

## 2020-04-10 DIAGNOSIS — R002 Palpitations: Secondary | ICD-10-CM | POA: Diagnosis not present

## 2020-04-10 MED ORDER — METOPROLOL TARTRATE 25 MG PO TABS: 25.0000 mg | ORAL_TABLET | Freq: Two times a day (BID) | ORAL | 1 refills | Status: DC

## 2020-04-10 NOTE — Progress Notes (Signed)
Virtual Visit via Video Note I connected with Ms Bartosik on 04/10/20 by a video enabled telemedicine application and verified that I am speaking with the correct person using two identifiers.  Location patient: home Location provider:work office Persons participating in the virtual visit: patient, provider  I discussed the limitations of evaluation and management by telemedicine and the availability of in person appointments. The patient expressed understanding and agreed to proceed.  Chief Complaint  Patient presents with  . Palpitations   HPI: Ms Vandekamp is a 29 yo female with hx of migraines,tobacco use, and asthma c/o worsening palpitations yesterday after learning that her first cousin has passed away. She had like a "panic attack" when she received the news, felt like she could not catch her breath,used Albuterol x 2.  Today it is better.  She went to the ER but did not wait to be seen.  She has hx of heart palpitations, 09/2019. She has been evaluated by cardiologist, 10/19/19.  Echo on 11/10/19: 1. Left ventricular ejection fraction, by estimation, is 60 to 65%. The left ventricle has normal function. The left ventricle has no regional wall motion abnormalities. There is mild left ventricular hypertrophy. Left ventricular diastolic parameters were normal.  2. Right ventricular systolic function is normal. The right ventricular size is normal. Tricuspid regurgitation signal is inadequate for assessing PA pressure.  3. The mitral valve is normal in structure. No evidence of mitral valve regurgitation.  4. The aortic valve was not well visualized. Aortic valve regurgitation is not visualized. No aortic stenosis is present.  5. The inferior vena cava is normal in size with greater than 50% respiratory variability, suggesting right atrial pressure of 3 mmHg.   Heart monitor on 10/20/19:  Sinus rhythm with minimum heart rate 61, average 92 bpm, maximum 160 bpm.  Rare PVCs noted, rare  ventricular bigeminy and trigeminy pattern noted. Isolated PVC was symptomatic at one point.  No atrial fibrillation, no adverse arrhythmias.  She has not identified exacerbating or alleviated factors. Usually at night when she is resting but for the past few days she has had episodes at work. No associated CP,dyspnea,diaphoresis,or syncope.  Negative for fever,chills, sore throat,cough,wheezing.abdominal pain, N/V, or focal deficit. She has not noted heartburn or burping.  ROS: See pertinent positives and negatives per HPI.  Past Medical History:  Diagnosis Date  . Asthma   . Migraines    with aura  . STD (sexually transmitted disease)    chlamydia 05-29-2019 treated   Past Surgical History:  Procedure Laterality Date  . None     Family History  Problem Relation Age of Onset  . Hypertension Mother   . Breast cancer Mother   . Hypertension Father   . Hypertension Sister   . Hypertension Maternal Grandmother   . Cancer Paternal Grandmother   . Cancer Paternal Grandfather     Social History   Socioeconomic History  . Marital status: Single    Spouse name: Not on file  . Number of children: Not on file  . Years of education: Not on file  . Highest education level: Not on file  Occupational History  . Not on file  Tobacco Use  . Smoking status: Current Every Day Smoker    Packs/day: 0.25    Years: 7.00    Pack years: 1.75    Types: Cigarettes  . Smokeless tobacco: Never Used  Vaping Use  . Vaping Use: Never used  Substance and Sexual Activity  . Alcohol use: No  .  Drug use: No  . Sexual activity: Yes    Partners: Male    Birth control/protection: Injection  Other Topics Concern  . Not on file  Social History Narrative  . Not on file   Social Determinants of Health   Financial Resource Strain:   . Difficulty of Paying Living Expenses: Not on file  Food Insecurity:   . Worried About Programme researcher, broadcasting/film/video in the Last Year: Not on file  . Ran Out of Food  in the Last Year: Not on file  Transportation Needs:   . Lack of Transportation (Medical): Not on file  . Lack of Transportation (Non-Medical): Not on file  Physical Activity:   . Days of Exercise per Week: Not on file  . Minutes of Exercise per Session: Not on file  Stress:   . Feeling of Stress : Not on file  Social Connections:   . Frequency of Communication with Friends and Family: Not on file  . Frequency of Social Gatherings with Friends and Family: Not on file  . Attends Religious Services: Not on file  . Active Member of Clubs or Organizations: Not on file  . Attends Banker Meetings: Not on file  . Marital Status: Not on file  Intimate Partner Violence:   . Fear of Current or Ex-Partner: Not on file  . Emotionally Abused: Not on file  . Physically Abused: Not on file  . Sexually Abused: Not on file    Current Outpatient Medications:  .  albuterol (PROVENTIL HFA;VENTOLIN HFA) 108 (90 Base) MCG/ACT inhaler, Inhale 2 puffs into the lungs every 4 (four) hours as needed for shortness of breath., Disp: 1 Inhaler, Rfl: 0 .  aspirin-acetaminophen-caffeine (EXCEDRIN MIGRAINE) 250-250-65 MG tablet, Take 1 tablet every 6 (six) hours as needed by mouth., Disp: 30 tablet, Rfl: 0 .  folic acid (FOLVITE) 1 MG tablet, Take 1 tablet (1 mg total) by mouth daily., Disp: 90 tablet, Rfl: 2 .  metoprolol tartrate (LOPRESSOR) 25 MG tablet, Take 1 tablet (25 mg total) by mouth 2 (two) times daily., Disp: 60 tablet, Rfl: 1  EXAM:  VITALS per patient if applicable:Pulse 76   Ht 5\' 7"  (1.702 m)   BMI 41.50 kg/m   GENERAL: alert, oriented, appears well and in no acute distress  HEENT: atraumatic, conjunctiva clear, no obvious abnormalities on inspection.  NECK: normal movements of the head and neck  LUNGS: on inspection no signs of respiratory distress, breathing rate appears normal, no obvious gross SOB, gasping or wheezing  CV: no obvious cyanosis  PSYCH/NEURO: pleasant and  cooperative, no obvious depression or anxiety, speech and thought processing grossly intact  ASSESSMENT AND PLAN:  Discussed the following assessment and plan:  Heart palpitations - Plan: metoprolol tartrate (LOPRESSOR) 25 MG tablet Hx does not suggest a serious process. Chronic. PVC's on heart monitor. After discussion of some side effects , she agrees with trying Metoprolol tartrate 25 mg bid, instructed to start 1/2 tab bid and increase to 25 mg bid in 2-3 days. Monitor BP and HR. Clearly instructed about warning signs.  Mild intermittent asthma without complication We discussed some side effects of Albuterol inh. Tobacco cessation will help to prevent exacerbations.  Continue Albuterol inh 1-2 puff q 6 hours as needed.  I discussed the assessment and treatment plan with the patient. Ms Syverson was provided an opportunity to ask questions and all were answered. She agreed with the plan and demonstrated an understanding of the instructions.   Return  in about 4 weeks (around 05/08/2020) for palpitations..   Elnora Quizon [place], MD

## 2020-04-11 NOTE — Progress Notes (Signed)
Patient is scheduled for 05/13/2020 at 4 PM for a virtual visit

## 2020-05-13 ENCOUNTER — Encounter: Payer: Self-pay | Admitting: Family Medicine

## 2020-05-13 ENCOUNTER — Ambulatory Visit (INDEPENDENT_AMBULATORY_CARE_PROVIDER_SITE_OTHER): Payer: 59 | Admitting: Family Medicine

## 2020-05-13 ENCOUNTER — Other Ambulatory Visit: Payer: Self-pay

## 2020-05-13 VITALS — BP 128/80 | HR 87 | Resp 16 | Ht 67.0 in | Wt 266.2 lb

## 2020-05-13 DIAGNOSIS — R002 Palpitations: Secondary | ICD-10-CM | POA: Diagnosis not present

## 2020-05-13 DIAGNOSIS — N6012 Diffuse cystic mastopathy of left breast: Secondary | ICD-10-CM

## 2020-05-13 DIAGNOSIS — M549 Dorsalgia, unspecified: Secondary | ICD-10-CM | POA: Diagnosis not present

## 2020-05-13 DIAGNOSIS — R0789 Other chest pain: Secondary | ICD-10-CM | POA: Diagnosis not present

## 2020-05-13 MED ORDER — TIZANIDINE HCL 4 MG PO TABS: 2.0000 mg | ORAL_TABLET | Freq: Three times a day (TID) | ORAL | 0 refills | Status: DC | PRN

## 2020-05-13 NOTE — Patient Instructions (Addendum)
A few things to remember from today's visit:   Upper back pain on left side  Chest tightness  Heart palpitations  If you need refills please call your pharmacy. Do not use My Chart to request refills or for acute issues that need immediate attention.   I do not appreciate abnormalities with breast exam. ? Fibrocystic breast.  Upper back and chest pain can be muscular related. Local massage and  ICY HOT ON UPPER BACK MAY HELP.If pain continues we can arrange PT.  If you decide to have a mammogram let me know.   Please be sure medication list is accurate. If a new problem present, please set up appointment sooner than planned today.

## 2020-05-13 NOTE — Progress Notes (Signed)
HPI: Frances Murphy is a 29 y.o. female, who is here today for follow up.   Frances Murphy was last seen on April 10, 2020. Last visit Frances Murphy was complaining about palpitations, metoprolol tartrate 25 mg twice daily were recommended.  Frances Murphy has not had palpitation for 3-4 days. Frances Murphy is no longer on metoprolol, Frances Murphy to medication for about 3 weeks and as needed. Medication also helped her rest at night.  Today Frances Murphy is complaining about "heavy" feeling on mid  upper chest and upper back, hard to "catch" her breath. This has been going on for a week.  Similar discomfort in the past after pulling a muscle, a few months ago.  It is intermittent. Started a week ago. Most of the time it happens when Frances Murphy is in bed. Exacerbated by movement. + Smoker.  No fever, cough,or wheezing. Frances Murphy has not taken OTC med. Frances Murphy has been evaluated by cardiologist, October 19, 2019. Had an echo done in 11/2019 that showed LVEF 60 to 65%  Negative for associated abdominal pain, nausea, vomiting, or heartburn. Frances Murphy has a "hard time" burping.  Left-sided upper back pain for a few days. Pain exacerbated by certain movements. Local heat has not helped. Frances Murphy has not noted erythema or edema on affected area. No recent injury or unusual physical activity. Frances Murphy has not noted numbness or tingling and pain is not radiated.  Frances Murphy is feeling "something" in left breast, for the past, Frances Murphy is not sure if it is a mass or normal breast tissue. Frances Murphy has no pain, skin changes, or nipple discharge. No hx of injury.  Mother with hx of breast cancer.  Review of Systems  Constitutional: Positive for fatigue. Negative for activity change and appetite change.  HENT: Negative for mouth sores, nosebleeds, sore throat and trouble swallowing.   Cardiovascular: Negative for leg swelling.  Gastrointestinal:       Negative for changes in bowel habits.  Genitourinary: Negative for decreased urine volume, dysuria and hematuria.  Musculoskeletal:  Negative for gait problem.  Neurological: Negative for syncope, facial asymmetry and weakness.  Rest of ROS, see pertinent positives sand negatives in HPI  Current Outpatient Medications on File Prior to Visit  Medication Sig Dispense Refill  . albuterol (PROVENTIL HFA;VENTOLIN HFA) 108 (90 Base) MCG/ACT inhaler Inhale 2 puffs into the lungs every 4 (four) hours as needed for shortness of breath. 1 Inhaler 0  . aspirin-acetaminophen-caffeine (EXCEDRIN MIGRAINE) 250-250-65 MG tablet Take 1 tablet every 6 (six) hours as needed by mouth. 30 tablet 0  . folic acid (FOLVITE) 1 MG tablet Take 1 tablet (1 mg total) by mouth daily. 90 tablet 2  . [DISCONTINUED] fluticasone (FLONASE) 50 MCG/ACT nasal spray Place 2 sprays into both nostrils daily. 1 g 0   No current facility-administered medications on file prior to visit.   Past Medical History:  Diagnosis Date  . Asthma   . Migraines    with aura  . STD (sexually transmitted disease)    chlamydia 05-29-2019 treated   Allergies  Allergen Reactions  . Penicillins     bumps   Social History   Socioeconomic History  . Marital status: Single    Spouse name: Not on file  . Number of children: Not on file  . Years of education: Not on file  . Highest education level: Not on file  Occupational History  . Not on file  Tobacco Use  . Smoking status: Current Every Day Smoker    Packs/day: 0.25  Years: 7.00    Pack years: 1.75    Types: Cigarettes  . Smokeless tobacco: Never Used  Vaping Use  . Vaping Use: Never used  Substance and Sexual Activity  . Alcohol use: No  . Drug use: No  . Sexual activity: Yes    Partners: Male    Birth control/protection: Injection  Other Topics Concern  . Not on file  Social History Narrative  . Not on file   Social Determinants of Health   Financial Resource Strain:   . Difficulty of Paying Living Expenses: Not on file  Food Insecurity:   . Worried About Programme researcher, broadcasting/film/video in the Last Year:  Not on file  . Ran Out of Food in the Last Year: Not on file  Transportation Needs:   . Lack of Transportation (Medical): Not on file  . Lack of Transportation (Non-Medical): Not on file  Physical Activity:   . Days of Exercise per Week: Not on file  . Minutes of Exercise per Session: Not on file  Stress:   . Feeling of Stress : Not on file  Social Connections:   . Frequency of Communication with Friends and Family: Not on file  . Frequency of Social Gatherings with Friends and Family: Not on file  . Attends Religious Services: Not on file  . Active Member of Clubs or Organizations: Not on file  . Attends Banker Meetings: Not on file  . Marital Status: Not on file   Vitals:   05/13/20 1550  BP: 128/80  Pulse: 87  Resp: 16  SpO2: 94%   Body mass index is 41.7 kg/m.  Physical Exam Vitals and nursing note reviewed.  Constitutional:      General: Frances Murphy is not in acute distress.    Appearance: Frances Murphy is well-developed and well-groomed.  HENT:     Head: Normocephalic and atraumatic.  Eyes:     Conjunctiva/sclera: Conjunctivae normal.     Pupils: Pupils are equal, round, and reactive to light.  Cardiovascular:     Rate and Rhythm: Normal rate and regular rhythm.     Heart sounds: No murmur heard.   Pulmonary:     Effort: Pulmonary effort is normal. No respiratory distress.     Breath sounds: Normal breath sounds.  Chest:     Chest wall: No tenderness.  Abdominal:     Palpations: Abdomen is soft. There is no hepatomegaly or mass.     Tenderness: There is no abdominal tenderness.  Genitourinary:    Comments: Breast: Fibrocystic breast changes upper outer quadrant bilateral with no significant differences. No mases, skin changes,or nipple discharge. Musculoskeletal:     Cervical back: Spasms and tenderness present. Normal range of motion.     Thoracic back: Tenderness present. No bony tenderness.       Back:  Lymphadenopathy:     Cervical: No cervical  adenopathy.     Upper Body:     Right upper body: No axillary adenopathy.     Left upper body: No axillary adenopathy.  Skin:    General: Skin is warm.     Findings: No erythema or rash.  Neurological:     Mental Status: Frances Murphy is alert and oriented to person, place, and time.     Cranial Nerves: No cranial nerve deficit.     Gait: Gait normal.  Psychiatric:        Mood and Affect: Mood is anxious.   ASSESSMENT AND PLAN:  Frances Murphy was  seen today for follow-up.  Diagnoses and all orders for this visit:  Chest tightness We discussed possible etiologies. The likelihood of this being cardiac is low, although never 0. EKG on October 16, 2019 nonspecific T wave abnormalities on inferolateral leads. EKG on February 06, 2020 with no significant changes when compared with prior EKGs.  ?  Musculoskeletal pain, Zanaflex may help. ?  GI related,GERD precaution recommended for now. We could consider PPIs if problem is persistent. Instructed about warning signs.  Upper back pain on left side I do not think imaging is needed at this time. History suggest musculoskeletal pain. Muscle relaxant may help, some side effect discussed. Local massage and OTC IcyHot.  We could consider PT and/or Ortho evaluation if problem gets worse or if it is persistent.  -     tiZANidine (ZANAFLEX) 4 MG tablet; Take 0.5-1 tablets (2-4 mg total) by mouth every 8 (eight) hours as needed for muscle spasms.  Heart palpitations History and examination today do not suggest a serious process. Frances Murphy is no longer taking metoprolol tartrate and problem has improved, so no further recommendation today. Monitor for worsening symptoms. Follow with cardiology as a problem is recurrent.  Fibrocystic changes of left breast Examination today do not suggest a serious process. Frances Murphy agrees with holding on further studies, if breast discomfort gets worse, Frances Murphy can follow-up with her gynecologist discussed the need for diagnostic  mammogram. Continue monitoring for new symptoms.  Return if symptoms worsen or fail to improve.  Caitlin Hillmer G. [place], MD  [employer]. [place] office.  A few things to remember from today's visit:  Upper back pain on left side  Chest tightness  Heart palpitations  If you need refills please call your pharmacy. Do not use My Chart to request refills or for acute issues that need immediate attention.   I do not appreciate abnormalities with breast exam. ? Fibrocystic breast.  Upper back and chest pain can be muscular related. Local massage and  ICY HOT ON UPPER BACK MAY HELP.If pain continues we can arrange PT.  If you decide to have a mammogram let me know.   Please be sure medication list is accurate. If a new problem present, please set up appointment sooner than planned today.

## 2020-05-15 ENCOUNTER — Encounter: Payer: Self-pay | Admitting: Family Medicine

## 2020-05-16 ENCOUNTER — Emergency Department (HOSPITAL_COMMUNITY)
Admission: EM | Admit: 2020-05-16 | Discharge: 2020-05-17 | Disposition: A | Discharge status: Home / Self Care | Payer: 59 | Attending: Emergency Medicine | Admitting: Emergency Medicine

## 2020-05-16 ENCOUNTER — Other Ambulatory Visit: Payer: Self-pay

## 2020-05-16 ENCOUNTER — Emergency Department (HOSPITAL_COMMUNITY): Payer: 59

## 2020-05-16 ENCOUNTER — Encounter (HOSPITAL_COMMUNITY): Payer: Self-pay | Admitting: Emergency Medicine

## 2020-05-16 DIAGNOSIS — F17210 Nicotine dependence, cigarettes, uncomplicated: Secondary | ICD-10-CM | POA: Diagnosis not present

## 2020-05-16 DIAGNOSIS — R0789 Other chest pain: Secondary | ICD-10-CM | POA: Diagnosis not present

## 2020-05-16 DIAGNOSIS — R079 Chest pain, unspecified: Secondary | ICD-10-CM | POA: Diagnosis present

## 2020-05-16 DIAGNOSIS — J45909 Unspecified asthma, uncomplicated: Secondary | ICD-10-CM | POA: Diagnosis not present

## 2020-05-16 LAB — CBC
HCT: 38.3 % (ref 36.0–46.0)
Hemoglobin: 12.8 g/dL (ref 12.0–15.0)
MCH: 28.1 pg (ref 26.0–34.0)
MCHC: 33.4 g/dL (ref 30.0–36.0)
MCV: 84.2 fL (ref 80.0–100.0)
Platelets: 242 10*3/uL (ref 150–400)
RBC: 4.55 MIL/uL (ref 3.87–5.11)
RDW: 13.5 % (ref 11.5–15.5)
WBC: 9.9 10*3/uL (ref 4.0–10.5)
nRBC: 0 % (ref 0.0–0.2)

## 2020-05-16 LAB — I-STAT BETA HCG BLOOD, ED (MC, WL, AP ONLY): I-stat hCG, quantitative: <5 m[IU]/mL (ref <5)

## 2020-05-16 LAB — BASIC METABOLIC PANEL
Anion gap: 8 (ref 5–15)
BUN: 11 mg/dL (ref 6–20)
CO2: 25 mmol/L (ref 22–32)
Calcium: 8.9 mg/dL (ref 8.9–10.3)
Chloride: 106 mmol/L (ref 98–111)
Creatinine, Ser: 0.74 mg/dL (ref 0.44–1.00)
GFR, Estimated: >60 mL/min (ref >60)
Glucose, Bld: 96 mg/dL (ref 70–99)
Potassium: 3.2 mmol/L — ABNORMAL LOW (ref 3.5–5.1)
Sodium: 139 mmol/L (ref 135–145)

## 2020-05-16 LAB — TROPONIN I (HIGH SENSITIVITY): Troponin I (High Sensitivity): <2 ng/L (ref <18)

## 2020-05-16 MED ORDER — LIDOCAINE VISCOUS HCL 2 % MT SOLN
15.0000 mL | Freq: Once | OROMUCOSAL | Status: AC
  Administered 2020-05-16: 15 mL via ORAL
  Filled 2020-05-16: qty 15

## 2020-05-16 MED ORDER — ALUM & MAG HYDROXIDE-SIMETH 200-200-20 MG/5ML PO SUSP
30.0000 mL | Freq: Once | ORAL | Status: AC
  Administered 2020-05-16: 30 mL via ORAL
  Filled 2020-05-16: qty 30

## 2020-05-16 NOTE — ED Notes (Signed)
Patient transported to X-ray 

## 2020-05-16 NOTE — ED Triage Notes (Signed)
Patient complaining of left and central chest pain that started yesterday. Patient states it is not consistent.

## 2020-05-16 NOTE — ED Provider Notes (Signed)
[facility]-EMERGENCY DEPT Provider Note   CSN: [id number] Arrival date & time: 05/16/20  2212     History Chief Complaint  Patient presents with  . Chest Pain    Frances Murphy is a 29 y.o. female presenting for evaluation of chest pain.   Patient states she developed a sharp pain under her left breast yesterday when she was laying flat.  This lasted for a few seconds, before resolving without intervention.  Today, she has had no chest pain until once again she was laying down in bed.  She developed sharp pain under her left breast, and started feel short of breath as if she had swallowed her spit the wrong way.  She started to cough to try and clear her throat.  Since then, she has continued to have discomfort of her left side chest.  She denies current shortness of breath.  She denies dizziness, nausea, vomiting, diaphoresis.  She reports a history of palpitations for which she wore a cardiac monitor for 2 weeks, followed with Dr. Anne Fu in cardiology at that time.  She was on metoprolol for short period, but is no longer on this.  She denies any other cardiac history.  She states in the past couple days she has felt like she has had difficulty burping, this improves when she drinks soda.  She ate pizza just prior to going to bed tonight.  She is not on any medication for heartburn, no history of similar.  She was seen by her PCP recently for left-sided back pain, this has significantly improved with a muscle relaxer, she is still taking.  She denies recent fevers, chills, nasal congestion, cough, abdominal pain, urinary symptoms, abnormal bowel movements.  She denies recent travel, surgeries, immobilization, trauma, history of cancer, history previous DVT/PE, or OCP use.  HPI     Past Medical History:  Diagnosis Date  . Asthma   . Migraines    with aura  . STD (sexually transmitted disease)    chlamydia 05-29-2019 treated    Patient Active Problem List    Diagnosis Date Noted  . Preterm delivery, delivered 04/13/2013  . Preterm premature rupture of membranes 02/01/2013  . Premature labor 01/26/2013  . 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 Asthma 11/02/2012  . Morbid obesity (HCC) 11/02/2012  . Family history of breast cancer in mother 11/02/2012  . Current smoker 11/02/2012    Past Surgical History:  Procedure Laterality Date  . None       OB History    Gravida  1   Para  1   Term  0   Preterm  1   AB  0   Living  0     SAB  0   TAB  0   Ectopic  0   Multiple  0   Live Births  1           Family History  Problem Relation Age of Onset  . Hypertension Mother   . Breast cancer Mother   . Hypertension Father   . Hypertension Sister   . Hypertension Maternal Grandmother   . Cancer Paternal Grandmother   . Cancer Paternal Grandfather     Social History   Tobacco Use  . Smoking status: Current Every Day Smoker    Packs/day: 0.25    Years: 7.00    Pack years: 1.75    Types: Cigarettes  . Smokeless tobacco: Never Used  Vaping Use  . Vaping Use: Never  used  Substance Use Topics  . Alcohol use: No  . Drug use: No    Home Medications Prior to Admission medications   Medication Sig Start Date End Date Taking? Authorizing Provider  albuterol (PROVENTIL HFA;VENTOLIN HFA) 108 (90 Base) MCG/ACT inhaler Inhale 2 puffs into the lungs every 4 (four) hours as needed for shortness of breath. 07/17/16   Mesner, Barbara Cower, MD  alum & mag hydroxide-simeth (MAALOX ADVANCED MAX ST) 400-400-40 MG/5ML suspension Take 5 mLs by mouth every 6 (six) hours as needed for indigestion. 05/17/20   Onyx Edgley, PA-C  aspirin-acetaminophen-caffeine (EXCEDRIN MIGRAINE) [phone] MG tablet Take 1 tablet every 6 (six) hours as needed by mouth. 06/11/17   Ward, Chase Picket, PA-C  folic acid (FOLVITE) 1 MG tablet Take 1 tablet (1 mg total) by mouth daily. 02/26/20   [place], Betty G, MD  tiZANidine (ZANAFLEX) 4 MG tablet Take  0.5-1 tablets (2-4 mg total) by mouth every 8 (eight) hours as needed for muscle spasms. 05/13/20   [place], Betty G, MD  fluticasone (FLONASE) 50 MCG/ACT nasal spray Place 2 sprays into both nostrils daily. 02/07/19 04/19/19  Belinda Fisher, PA-C    Allergies    Penicillins  Review of Systems   Review of Systems  Cardiovascular: Positive for chest pain.  All other systems reviewed and are negative.   Physical Exam Updated Vital Signs BP (!) 133/96   Pulse 85   Temp 98.8 F (37.1 C) (Oral)   Resp 20   Ht 5\' 7"  (1.702 m)   Wt 120.7 kg   SpO2 100%   BMI 41.66 kg/m   Physical Exam Vitals and nursing note reviewed.  Constitutional:      General: She is not in acute distress.    Appearance: She is well-developed.     Comments: Resting in the bed in no acute distress  HENT:     Head: Normocephalic and atraumatic.  Eyes:     Conjunctiva/sclera: Conjunctivae normal.     Pupils: Pupils are equal, round, and reactive to light.  Cardiovascular:     Rate and Rhythm: Normal rate and regular rhythm.     Pulses: Normal pulses.  Pulmonary:     Effort: Pulmonary effort is normal. No respiratory distress.     Breath sounds: Normal breath sounds. No wheezing.     Comments: TTP of the chest wall anteriorly along the sternal border. Chest:     Chest wall: Tenderness present.  Abdominal:     General: There is no distension.     Palpations: Abdomen is soft. There is no mass.     Tenderness: There is no abdominal tenderness. There is no guarding or rebound.  Musculoskeletal:        General: Normal range of motion.     Cervical back: Normal range of motion and neck supple.     Right lower leg: No edema.     Left lower leg: No edema.  Skin:    General: Skin is warm and dry.     Capillary Refill: Capillary refill takes less than 2 seconds.  Neurological:     Mental Status: She is alert and oriented to person, place, and time.     ED Results / Procedures / Treatments   Labs (all labs  ordered are listed, but only abnormal results are displayed) Labs Reviewed  BASIC METABOLIC PANEL - Abnormal; Notable for the following components:      Result Value   Potassium 3.2 (*)  All other components within normal limits  CBC  I-STAT BETA HCG BLOOD, ED ([facility], [facility], [facility] ONLY)  TROPONIN I (HIGH SENSITIVITY)  TROPONIN I (HIGH SENSITIVITY)    EKG None      Radiology DG Chest 2 View  Result Date: 05/16/2020 CLINICAL DATA:  30 year old female with chest pain on set yesterday. EXAM: CHEST - 2 VIEW COMPARISON:  Chest radiographs 09/28/2018 and earlier. FINDINGS: Cardiac size is chronically at the upper limits of normal to mildly enlarged. Other mediastinal contours are within normal limits. Visualized tracheal air column is within normal limits. Lung volumes remain within normal limits. Both lungs appear clear. No pneumothorax or pleural effusion. No acute osseous abnormality identified. Negative visible bowel gas pattern. IMPRESSION: No acute cardiopulmonary abnormality. Cardiac size chronically at the upper limits of normal to mildly enlarged. Electronically Signed   By: Odessa Fleming M.D.   On: 05/16/2020 22:42    Procedures Procedures (including critical care time)  Medications Ordered in ED Medications  alum & mag hydroxide-simeth (MAALOX/MYLANTA) 200-200-20 MG/5ML suspension 30 mL (30 mLs Oral Given 05/16/20 2301)    And  lidocaine (XYLOCAINE) 2 % viscous mouth solution 15 mL (15 mLs Oral Given 05/16/20 2301)    ED Course  I have reviewed the triage vital signs and the nursing notes.  Pertinent labs & imaging results that were available during my care of the patient were reviewed by me and considered in my medical decision making (see chart for details).    MDM Rules/Calculators/A&P                          Patient presenting for evaluation of chest pain.  On exam, patient is nontoxic.  Pain began when laying flat tonight after eating pizza.  She has had some issues with  burping recently, as such, consider GI pathology such as GERD.  Lower suspicion for ACS, patient without risk factors, however considering nature of left-sided chest pain, obtain EKG and troponin.  Patient is PERC negative, low suspicion for PE.  Doubt dissection.  Will give GI cocktail, pain basic cardiac work-up, and reassess.  EKG nonischemic.  Chest x-ray viewed interpreted by me, no pneumonia pneumothorax effusion.  Patient with baseline borderline cardiomegaly, unchanged from previous.  Labs interpreted by me, overall reassuring.  Initial troponin negative.  Electrolytes overall stable.  As pain began less than 3 hours ago, will obtain delta Trop.  On reassessment of symptoms, patient reports symptoms have resolved with GI cocktail, high suspicion for GI eitiology.  Delta trop negative. Discussed with pt. Discussed likely GI cause. discussed diet changes, meds to take as needed. F/u with PCP. At this time, pt appears safe for d/c. Return precautions given. Pt states she understands and agrees to plan.   Final Clinical Impression(s) / ED Diagnoses Final diagnoses:  Atypical chest pain    Rx / DC Orders ED Discharge Orders         Ordered    alum & mag hydroxide-simeth (MAALOX ADVANCED MAX ST) 400-400-40 MG/5ML suspension  Every 6 hours PRN        05/17/20 0111           Alveria Apley, PA-C 05/17/20 0116    Nira Conn, MD 05/17/20 [phone]

## 2020-05-17 ENCOUNTER — Other Ambulatory Visit: Payer: Self-pay | Admitting: Family Medicine

## 2020-05-17 LAB — TROPONIN I (HIGH SENSITIVITY): Troponin I (High Sensitivity): 2 ng/L (ref <18)

## 2020-05-17 MED ORDER — OMEPRAZOLE 40 MG PO CPDR: 40.0000 mg | DELAYED_RELEASE_CAPSULE | Freq: Every day | ORAL | 2 refills | Status: DC

## 2020-05-17 MED ORDER — ALUM & MAG HYDROXIDE-SIMETH 400-400-40 MG/5ML PO SUSP: 5.0000 mL | Freq: Four times a day (QID) | ORAL | 0 refills | Status: DC | PRN

## 2020-05-17 NOTE — Discharge Instructions (Signed)
Your work-up for your heart today was overall reassuring. You may be experiencing heartburn/GERD.  There is information in the paperwork about dietary changes that may improve your symptoms. Use Maalox as needed for pain. If you continue to have symptoms, follow-up with your primary care doctor as you may need to be on a preventative medicine. Return to the emergency room if you develop severe worsening pain, difficulty breathing, any new, worsening, or concerning symptoms.

## 2020-05-20 ENCOUNTER — Ambulatory Visit (INDEPENDENT_AMBULATORY_CARE_PROVIDER_SITE_OTHER): Payer: 59 | Admitting: *Deleted

## 2020-05-20 ENCOUNTER — Other Ambulatory Visit: Payer: Self-pay

## 2020-05-20 DIAGNOSIS — Z3042 Encounter for surveillance of injectable contraceptive: Secondary | ICD-10-CM | POA: Diagnosis not present

## 2020-05-20 MED ORDER — MEDROXYPROGESTERONE ACETATE 150 MG/ML IM SUSP
150.0000 mg | Freq: Once | INTRAMUSCULAR | Status: AC
  Administered 2020-05-20: 150 mg via INTRAMUSCULAR

## 2020-05-20 NOTE — Patient Instructions (Signed)
Please schedule a nurse visit for your next Depo-Provera injection between the dates of 1.3.22-1.17.22 :)

## 2020-05-20 NOTE — Progress Notes (Signed)
After obtaining consent, and per orders of Dr. [place], injection of Depo-Provera 150mg /mL given in Left upper outer quadrant of glute at 2:30pm by Jamen Loiseau. . Patient instructed to remain in clinic for 20 minutes afterwards, and to report any adverse reaction to me immediately.

## 2020-05-29 ENCOUNTER — Ambulatory Visit: Payer: No Typology Code available for payment source | Admitting: Obstetrics and Gynecology

## 2020-05-29 ENCOUNTER — Ambulatory Visit: Payer: No Typology Code available for payment source | Admitting: Certified Nurse Midwife

## 2020-07-03 ENCOUNTER — Telehealth: Payer: Self-pay

## 2020-07-03 ENCOUNTER — Encounter: Payer: Self-pay | Admitting: Family Medicine

## 2020-07-03 DIAGNOSIS — Z803 Family history of malignant neoplasm of breast: Secondary | ICD-10-CM

## 2020-07-03 DIAGNOSIS — N644 Mastodynia: Secondary | ICD-10-CM

## 2020-07-03 NOTE — Addendum Note (Signed)
Addended by: Kathreen Devoid on: 07/03/2020 05:11 PM   Modules accepted: Orders

## 2020-07-03 NOTE — Telephone Encounter (Signed)
It is ok to order Dx mammogram due to breast tenderness + FHx of breast cancer. Thanks, BJ

## 2020-07-03 NOTE — Telephone Encounter (Signed)
I spoke with pt. She is having some pains in her breasts. Her mom had breast cancer, but it was in her milk ducts so she never had any lumps. Pt is requesting a mammogram order to make sure there is nothing going on. Okay to place mammogram order?

## 2020-07-08 ENCOUNTER — Encounter: Payer: Self-pay | Admitting: Family Medicine

## 2020-07-09 ENCOUNTER — Ambulatory Visit
Admission: EM | Admit: 2020-07-09 | Discharge: 2020-07-09 | Disposition: A | Discharge status: Home / Self Care | Payer: 59

## 2020-07-09 ENCOUNTER — Encounter: Payer: Self-pay | Admitting: Family Medicine

## 2020-07-09 ENCOUNTER — Other Ambulatory Visit: Payer: Self-pay

## 2020-07-09 DIAGNOSIS — Z1152 Encounter for screening for COVID-19: Secondary | ICD-10-CM | POA: Diagnosis not present

## 2020-07-09 DIAGNOSIS — J069 Acute upper respiratory infection, unspecified: Secondary | ICD-10-CM | POA: Diagnosis not present

## 2020-07-09 HISTORY — DX: Gastro-esophageal reflux disease without esophagitis: K21.9

## 2020-07-09 MED ORDER — DM-GUAIFENESIN ER 30-600 MG PO TB12
1.0000 | ORAL_TABLET | Freq: Two times a day (BID) | ORAL | 0 refills | Status: DC
Start: 2020-07-09 — End: 2020-10-19

## 2020-07-09 MED ORDER — PREDNISONE 20 MG PO TABS: 40.0000 mg | ORAL_TABLET | Freq: Every day | ORAL | 0 refills | Status: AC

## 2020-07-09 MED ORDER — ALBUTEROL SULFATE HFA 108 (90 BASE) MCG/ACT IN AERS: 1.0000 | INHALATION_SPRAY | Freq: Four times a day (QID) | RESPIRATORY_TRACT | 0 refills | Status: DC | PRN

## 2020-07-09 MED ORDER — BENZONATATE 200 MG PO CAPS
200.0000 mg | ORAL_CAPSULE | Freq: Three times a day (TID) | ORAL | 0 refills | Status: AC | PRN
Start: 2020-07-09 — End: 2020-07-16

## 2020-07-09 NOTE — Discharge Instructions (Signed)
COVID test pending Albuterol inhaler refilled Prednisone if wheezing/asthma worsening Tessalon for cough Mucinex dm for cough/congestion Follow up if not improving

## 2020-07-09 NOTE — ED Provider Notes (Signed)
[name]    CSN: [id number] Arrival date & time: 07/09/20  5597      History   Chief Complaint Chief Complaint  Patient presents with  . Cough    HPI Frances Murphy is a 29 y.o. female history of asthma, GERD, migraines, presenting today for evaluation of a cough. Patient reports that symptoms began yesterday. Feels as if her asthma has flared up. Has chest tightness. Denies any known Covid exposure. Denies fevers, chills, bodyaches. Cough productive.   HPI  Past Medical History:  Diagnosis Date  . Asthma   . GERD (gastroesophageal reflux disease)   . Migraines    with aura  . STD (sexually transmitted disease)    chlamydia 05-29-2019 treated    Patient Active Problem List   Diagnosis Date Noted  . Preterm delivery, delivered 04/13/2013  . Preterm premature rupture of membranes 02/01/2013  . Premature labor 01/26/2013  . 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 Asthma 11/02/2012  . Morbid obesity (HCC) 11/02/2012  . Family history of breast cancer in mother 11/02/2012  . Current smoker 11/02/2012    Past Surgical History:  Procedure Laterality Date  . None      OB History    Gravida  1   Para  1   Term  0   Preterm  1   AB  0   Living  0     SAB  0   TAB  0   Ectopic  0   Multiple  0   Live Births  1            Home Medications    Prior to Admission medications   Medication Sig Start Date End Date Taking? Authorizing Provider  medroxyPROGESTERone (DEPO-PROVERA) 150 MG/ML injection Inject 150 mg into the muscle every 3 (three) months.   Yes [provider]  Melatonin 10 MG TABS Take 1 tablet by mouth at bedtime as needed.   Yes [provider]  albuterol (VENTOLIN HFA) 108 (90 Base) MCG/ACT inhaler Inhale 1-2 puffs into the lungs every 6 (six) hours as needed for wheezing or shortness of breath. 07/09/20   Altamese Deguire C, PA-C  aspirin-acetaminophen-caffeine (EXCEDRIN MIGRAINE) [phone] MG  tablet Take 1 tablet every 6 (six) hours as needed by mouth. 06/11/17   Ward, Chase Picket, PA-C  benzonatate (TESSALON) 200 MG capsule Take 1 capsule (200 mg total) by mouth 3 (three) times daily as needed for up to 7 days for cough. 07/09/20 07/16/20  Roshaun Pound C, PA-C  dextromethorphan-guaiFENesin (MUCINEX DM) 30-600 MG 12hr tablet Take 1 tablet by mouth 2 (two) times daily. 07/09/20   Reynaldo Rossman C, PA-C  predniSONE (DELTASONE) 20 MG tablet Take 2 tablets (40 mg total) by mouth daily for 5 days. 07/09/20 07/14/20  Lanay Zinda C, PA-C  fluticasone (FLONASE) 50 MCG/ACT nasal spray Place 2 sprays into both nostrils daily. 02/07/19 04/19/19  Belinda Fisher, PA-C  omeprazole (PRILOSEC) 40 MG capsule Take 1 capsule (40 mg total) by mouth daily before breakfast. 05/17/20 07/09/20  [place], Betty G, MD    Family History Family History  Problem Relation Age of Onset  . Hypertension Mother   . Breast cancer Mother   . Hypertension Father   . Hypertension Sister   . Hypertension Maternal Grandmother   . Cancer Paternal Grandmother   . Cancer Paternal Grandfather     Social History Social History   Tobacco Use  . Smoking status: Current Every Day Smoker  Packs/day: 0.25    Years: 7.00    Pack years: 1.75    Types: Cigarettes  . Smokeless tobacco: Never Used  Vaping Use  . Vaping Use: Never used  Substance Use Topics  . Alcohol use: No  . Drug use: No     Allergies   Penicillins   Review of Systems Review of Systems  Constitutional: Negative for activity change, appetite change, chills, fatigue and fever.  HENT: Positive for congestion. Negative for ear pain, rhinorrhea, sinus pressure, sore throat and trouble swallowing.   Eyes: Negative for discharge and redness.  Respiratory: Positive for cough, chest tightness, shortness of breath and wheezing.   Cardiovascular: Negative for chest pain.  Gastrointestinal: Negative for abdominal pain, diarrhea, nausea and vomiting.    Musculoskeletal: Negative for myalgias.  Skin: Negative for rash.  Neurological: Negative for dizziness, light-headedness and headaches.     Physical Exam Triage Vital Signs ED Triage Vitals  Enc Vitals Group     BP 07/09/20 0931 121/84     Pulse Rate 07/09/20 0931 85     Resp 07/09/20 0931 18     Temp 07/09/20 0931 98 F (36.7 C)     Temp Source 07/09/20 0931 Oral     SpO2 07/09/20 0931 96 %     Weight 07/09/20 0925 265 lb (120.2 kg)     Height 07/09/20 0925 5\' 3"  (1.6 m)     Head Circumference --      Peak Flow --      Pain Score 07/09/20 0925 0     Pain Loc --      Pain Edu? --      Excl. in GC? --    No data found.  Updated Vital Signs BP 121/84 (BP Location: Left Arm)   Pulse 85   Temp 98 F (36.7 C) (Oral)   Resp 18   Ht 5\' 3"  (1.6 m)   Wt 265 lb (120.2 kg)   LMP 07/08/2020 (Exact Date)   SpO2 96%   BMI 46.94 kg/m   Visual Acuity Right Eye Distance:   Left Eye Distance:   Bilateral Distance:    Right Eye Near:   Left Eye Near:    Bilateral Near:     Physical Exam Vitals and nursing note reviewed.  Constitutional:      Appearance: She is well-developed.     Comments: No acute distress  HENT:     Head: Normocephalic and atraumatic.     Ears:     Comments: Bilateral ears without tenderness to palpation of external auricle, tragus and mastoid, EAC's without erythema or swelling, TM's with good bony landmarks and cone of light. Non erythematous.     Nose: Nose normal.     Mouth/Throat:     Comments: Oral mucosa pink and moist, no tonsillar enlargement or exudate. Posterior pharynx patent and nonerythematous, no uvula deviation or swelling. Normal phonation. Eyes:     Conjunctiva/sclera: Conjunctivae normal.  Cardiovascular:     Rate and Rhythm: Normal rate and regular rhythm.  Pulmonary:     Effort: Pulmonary effort is normal. No respiratory distress.     Comments: Breathing comfortably at rest, CTABL, no wheezing, rales or other adventitious  sounds auscultated Abdominal:     General: There is no distension.  Musculoskeletal:        General: Normal range of motion.     Cervical back: Neck supple.  Skin:    General: Skin is warm and dry.  Neurological:  Mental Status: She is alert and oriented to person, place, and time.      UC Treatments / Results  Labs (all labs ordered are listed, but only abnormal results are displayed) Labs Reviewed  NOVEL CORONAVIRUS, NAA    EKG   Radiology No results found.  Procedures Procedures (including critical care time)  Medications Ordered in UC Medications - No data to display  Initial Impression / Assessment and Plan / UC Course  I have reviewed the triage vital signs and the nursing notes.  Pertinent labs & imaging results that were available during my care of the patient were reviewed by me and considered in my medical decision making (see chart for details).     Viral URI with cough-Covid test pending, flaring asthma symptoms.  Currently without wheezing.  Refilling albuterol inhaler, providing course of prednisone for wheezing and asthma especially symptoms worsening and not controlled with inhaler alone.  Otherwise recommending symptomatic and supportive care rest and fluids.  Discussed strict return precautions. Patient verbalized understanding and is agreeable with plan.  Final Clinical Impressions(s) / UC Diagnoses   Final diagnoses:  Encounter for screening for COVID-19  Viral URI with cough     Discharge Instructions     COVID test pending Albuterol inhaler refilled Prednisone if wheezing/asthma worsening Tessalon for cough Mucinex dm for cough/congestion Follow up if not improving    ED Prescriptions    Medication Sig Dispense Auth. Provider   albuterol (VENTOLIN HFA) 108 (90 Base) MCG/ACT inhaler Inhale 1-2 puffs into the lungs every 6 (six) hours as needed for wheezing or shortness of breath. 18 g Lauree Yurick C, PA-C   predniSONE  (DELTASONE) 20 MG tablet Take 2 tablets (40 mg total) by mouth daily for 5 days. 10 tablet Viha Kriegel C, PA-C   benzonatate (TESSALON) 200 MG capsule Take 1 capsule (200 mg total) by mouth 3 (three) times daily as needed for up to 7 days for cough. 28 capsule Shakea Isip C, PA-C   dextromethorphan-guaiFENesin (MUCINEX DM) 30-600 MG 12hr tablet Take 1 tablet by mouth 2 (two) times daily. 20 tablet Aila Terra, [place] C, PA-C     PDMP not reviewed this encounter.   Lew Dawes, [place] 07/09/20 [phone]

## 2020-07-09 NOTE — ED Triage Notes (Signed)
Pt presents to Urgent Care with c/o cough and "tightness in my chest like when my asthma acts up" since yesterday. Pt w/ no known COVID exposure; she has not been vaccinated.

## 2020-07-10 LAB — SARS-COV-2, NAA 2 DAY TAT

## 2020-07-10 LAB — NOVEL CORONAVIRUS, NAA: SARS-CoV-2, NAA: NOT DETECTED

## 2020-07-16 ENCOUNTER — Ambulatory Visit: Payer: 59 | Admitting: Family Medicine

## 2020-07-22 ENCOUNTER — Other Ambulatory Visit (HOSPITAL_COMMUNITY)
Admission: RE | Admit: 2020-07-22 | Discharge: 2020-07-22 | Disposition: A | Discharge status: Home / Self Care | Payer: 59 | Source: Ambulatory Visit | Attending: Family Medicine | Admitting: Family Medicine

## 2020-07-22 ENCOUNTER — Encounter: Payer: Self-pay | Admitting: Family Medicine

## 2020-07-22 ENCOUNTER — Other Ambulatory Visit: Payer: Self-pay

## 2020-07-22 ENCOUNTER — Ambulatory Visit (INDEPENDENT_AMBULATORY_CARE_PROVIDER_SITE_OTHER): Payer: 59 | Admitting: Family Medicine

## 2020-07-22 VITALS — BP 124/82 | HR 83 | Temp 98.3°F | Resp 16 | Ht 63.0 in | Wt 262.6 lb

## 2020-07-22 DIAGNOSIS — N938 Other specified abnormal uterine and vaginal bleeding: Secondary | ICD-10-CM

## 2020-07-22 NOTE — Patient Instructions (Addendum)
A few things to remember from today's visit:  We can start birth control pills once we have ruled out other causes. You will be contacted if labs are abnormal.  Please be sure medication list is accurate. If a new problem present, please set up appointment sooner than planned today.

## 2020-07-22 NOTE — Progress Notes (Signed)
Chief Complaint  Patient presents with  . Vaginal Bleeding   HPI: [name] is a 29 y.o. female, who is here today complaining of intermittent episodes of vaginal bleeding. Problem has been going since 06/30/20, total x 3. She has not identified exacerbating or alleviating factors. This is a new problem.  Vaginal bleeding 8 days ago for about 2-3 days, day 1 bleeding was heavy. She is on Depo provera for years and has not missed a dose in the past year.  She is sexually active, condoms most of the time. When problem first started she had severe abdominal cramps. No associated nausea,vomiting,or vaginal discharge.  She has not done a pregnancy test at home. Has been treated for chlamydia in the past x 2.  Last pap smear 03/10/18.  Review of Systems  Constitutional: Negative for chills, fatigue and fever.  Gastrointestinal: Negative for abdominal pain and blood in stool.       No changes in bowel habits   Endocrine: Negative for cold intolerance and heat intolerance.  Genitourinary: Negative for decreased urine volume, dyspareunia, dysuria and hematuria.  Musculoskeletal: Negative for gait problem and myalgias.  Neurological: Negative for syncope and weakness.  Rest see pertinent positives and negatives per HPI.  Current Outpatient Medications on File Prior to Visit  Medication Sig Dispense Refill  . albuterol (VENTOLIN HFA) 108 (90 Base) MCG/ACT inhaler Inhale 1-2 puffs into the lungs every 6 (six) hours as needed for wheezing or shortness of breath. 18 g 0  . aspirin-acetaminophen-caffeine (EXCEDRIN MIGRAINE) 250-250-65 MG tablet Take 1 tablet every 6 (six) hours as needed by mouth. 30 tablet 0  . dextromethorphan-guaiFENesin (MUCINEX DM) 30-600 MG 12hr tablet Take 1 tablet by mouth 2 (two) times daily. 20 tablet 0  . medroxyPROGESTERone (DEPO-PROVERA) 150 MG/ML injection Inject 150 mg into the muscle every 3 (three) months.    . Melatonin 10 MG TABS Take 1 tablet  by mouth at bedtime as needed.    . [DISCONTINUED] fluticasone (FLONASE) 50 MCG/ACT nasal spray Place 2 sprays into both nostrils daily. 1 g 0  . [DISCONTINUED] omeprazole (PRILOSEC) 40 MG capsule Take 1 capsule (40 mg total) by mouth daily before breakfast. 30 capsule 2   No current facility-administered medications on file prior to visit.   Past Medical History:  Diagnosis Date  . Asthma   . GERD (gastroesophageal reflux disease)   . Migraines    with aura  . STD (sexually transmitted disease)    chlamydia 05-29-2019 treated   Allergies  Allergen Reactions  . Penicillins     bumps    Social History   Socioeconomic History  . Marital status: Single    Spouse name: Not on file  . Number of children: Not on file  . Years of education: Not on file  . Highest education level: Not on file  Occupational History  . Not on file  Tobacco Use  . Smoking status: Current Every Day Smoker    Packs/day: 0.25    Years: 7.00    Pack years: 1.75    Types: Cigarettes  . Smokeless tobacco: Never Used  Vaping Use  . Vaping Use: Never used  Substance and Sexual Activity  . Alcohol use: No  . Drug use: No  . Sexual activity: Yes    Partners: Male    Birth control/protection: Injection  Other Topics Concern  . Not on file  Social History Narrative  . Not on file   Social Determinants of Health  Financial Resource Strain: Not on file  Food Insecurity: Not on file  Transportation Needs: Not on file  Physical Activity: Not on file  Stress: Not on file  Social Connections: Not on file    Vitals:   07/22/20 1453  BP: 124/82  Pulse: 83  Resp: 16  Temp: 98.3 F (36.8 C)  SpO2: 98%   Body mass index is 46.52 kg/m.  Physical Exam Vitals and nursing note reviewed. Exam conducted with a chaperone present.  Constitutional:      General: She is not in acute distress.    Appearance: She is well-developed.  HENT:     Head: Normocephalic and atraumatic.  Eyes:      Conjunctiva/sclera: Conjunctivae normal.  Cardiovascular:     Rate and Rhythm: Normal rate and regular rhythm.  Pulmonary:     Effort: Pulmonary effort is normal. No respiratory distress.     Breath sounds: Normal breath sounds.  Abdominal:     Palpations: Abdomen is soft. There is no mass.     Tenderness: There is no abdominal tenderness. There is no CVA tenderness.  Genitourinary:    Exam position: Lithotomy position.     Labia:        Right: No rash, tenderness or lesion.        Left: No rash, tenderness or lesion.      Vagina: Vaginal discharge present. No erythema, tenderness or bleeding.     Cervix: No discharge, friability or erythema.     Uterus: Not enlarged.      Adnexa:        Right: No mass, tenderness or fullness.         Left: No mass, tenderness or fullness.    Musculoskeletal:        General: No edema.  Lymphadenopathy:     Cervical: No cervical adenopathy.  Skin:    General: Skin is warm.     Findings: No erythema.  Neurological:     General: No focal deficit present.     Mental Status: She is alert and oriented to person, place, and time.   ASSESSMENT AND PLAN:  [name] was seen today for vaginal bleeding.  Diagnoses and all orders for this visit:   Orders Placed This Encounter  Procedures  . US PELVIC COMPLETE WITH TRANSVAGINAL  . CBC  . TSH  . HCG, Qualitative   Lab Results  Component Value Date   TSH 1.50 07/22/2020   Lab Results  Component Value Date   WBC 9.8 07/22/2020   HGB 13.5 07/22/2020   HCT 41.2 07/22/2020   MCV 83.9 07/22/2020   PLT 280 07/22/2020   DUB (dysfunctional uterine bleeding) We discussed possible etiologies. Examination today and hx do not suggest a serious process. Planning on discontinuing depo provera, will plan on OCP if labs otherwise normal. Pelvic/transvaginal US will be arranged. Instructed about warning signs.  Spent 30 minutes.  During this time history was obtained and documented, examination was  performed, prior labs reviewed, and assessment/plan discussed.  Return if symptoms worsen or fail to improve.   Cheralyn Oliver G. [place], MD  [facility]. [place] office.  A few things to remember from today's visit:  We can start birth control pills once we have ruled out other causes. You will be contacted if labs are abnormal.  Please be sure medication list is accurate. If a new problem present, please set up appointment sooner than planned today.

## 2020-07-23 ENCOUNTER — Other Ambulatory Visit: Payer: Self-pay | Admitting: Family Medicine

## 2020-07-23 DIAGNOSIS — Z803 Family history of malignant neoplasm of breast: Secondary | ICD-10-CM

## 2020-07-23 DIAGNOSIS — N644 Mastodynia: Secondary | ICD-10-CM

## 2020-07-23 LAB — HCG, SERUM, QUALITATIVE: Preg, Serum: NEGATIVE

## 2020-07-23 LAB — CBC
HCT: 41.2 % (ref 35.0–45.0)
Hemoglobin: 13.5 g/dL (ref 11.7–15.5)
MCH: 27.5 pg (ref 27.0–33.0)
MCHC: 32.8 g/dL (ref 32.0–36.0)
MCV: 83.9 fL (ref 80.0–100.0)
MPV: 10 fL (ref 7.5–12.5)
Platelets: 280 10*3/uL (ref 140–400)
RBC: 4.91 10*6/uL (ref 3.80–5.10)
RDW: 12.5 % (ref 11.0–15.0)
WBC: 9.8 10*3/uL (ref 3.8–10.8)

## 2020-07-23 LAB — TSH: TSH: 1.5 mIU/L

## 2020-07-24 LAB — CERVICOVAGINAL ANCILLARY ONLY
Chlamydia: NEGATIVE
Comment: NEGATIVE
Comment: NEGATIVE
Comment: NORMAL
Neisseria Gonorrhea: NEGATIVE
Trichomonas: NEGATIVE

## 2020-07-25 MED ORDER — NORGESTIMATE-ETH ESTRADIOL 0.25-35 MG-MCG PO TABS: 1.0000 | ORAL_TABLET | Freq: Every day | ORAL | 2 refills | Status: DC

## 2020-08-06 ENCOUNTER — Ambulatory Visit: Payer: 59

## 2020-08-09 ENCOUNTER — Other Ambulatory Visit: Payer: 59

## 2020-08-20 ENCOUNTER — Other Ambulatory Visit: Payer: 59

## 2020-08-26 ENCOUNTER — Encounter: Payer: Self-pay | Admitting: Family Medicine

## 2020-08-29 ENCOUNTER — Other Ambulatory Visit: Payer: 59

## 2020-09-02 ENCOUNTER — Ambulatory Visit
Admission: RE | Admit: 2020-09-02 | Discharge: 2020-09-02 | Disposition: A | Discharge status: Home / Self Care | Payer: 59 | Source: Ambulatory Visit | Attending: Family Medicine | Admitting: Family Medicine

## 2020-09-02 ENCOUNTER — Ambulatory Visit: Payer: 59

## 2020-09-02 ENCOUNTER — Other Ambulatory Visit: Payer: Self-pay

## 2020-09-02 DIAGNOSIS — N644 Mastodynia: Secondary | ICD-10-CM

## 2020-09-02 DIAGNOSIS — Z803 Family history of malignant neoplasm of breast: Secondary | ICD-10-CM

## 2020-09-10 ENCOUNTER — Ambulatory Visit
Admission: RE | Admit: 2020-09-10 | Discharge: 2020-09-10 | Disposition: A | Discharge status: Home / Self Care | Payer: 59 | Source: Ambulatory Visit | Attending: Family Medicine | Admitting: Family Medicine

## 2020-09-10 DIAGNOSIS — N938 Other specified abnormal uterine and vaginal bleeding: Secondary | ICD-10-CM

## 2020-10-07 ENCOUNTER — Encounter: Payer: Self-pay | Admitting: Family Medicine

## 2020-10-07 DIAGNOSIS — N938 Other specified abnormal uterine and vaginal bleeding: Secondary | ICD-10-CM

## 2020-10-08 ENCOUNTER — Other Ambulatory Visit: Payer: Self-pay

## 2020-10-08 ENCOUNTER — Encounter: Payer: Self-pay | Admitting: Family Medicine

## 2020-10-08 ENCOUNTER — Ambulatory Visit (INDEPENDENT_AMBULATORY_CARE_PROVIDER_SITE_OTHER): Payer: 59 | Admitting: Family Medicine

## 2020-10-08 VITALS — BP 130/80 | HR 84 | Resp 16 | Ht 63.0 in | Wt 261.0 lb

## 2020-10-08 DIAGNOSIS — N926 Irregular menstruation, unspecified: Secondary | ICD-10-CM | POA: Diagnosis not present

## 2020-10-08 DIAGNOSIS — F17200 Nicotine dependence, unspecified, uncomplicated: Secondary | ICD-10-CM

## 2020-10-08 DIAGNOSIS — E876 Hypokalemia: Secondary | ICD-10-CM

## 2020-10-08 LAB — POTASSIUM: Potassium: 4 mEq/L (ref 3.5–5.1)

## 2020-10-08 NOTE — Progress Notes (Signed)
Chief Complaint  Patient presents with   Follow-up   HPI: Frances Murphy is a 30 y.o. female, who is here today complaining of irregular menses. Concerned because she has not have a menstrual period this month. She was seen on 07/22/20 , when she was c/o abnormal vaginal bleeding. Last Depo Provera injections in 05/2020. She was on Depo Provera for years, started around 2017. LMP 08/23/2020. She is not using other birth control method, would like to get pregnant. She is not on Folic acid supplementation.  G:1 L:1  Referral to gyn has been placed, she has not received appt information yet.  + Smoker. She is considering stopping smoking.  She does not have an exercise routine but walks at work. She has tried to decrease carb intake. Wt seems stable. Planning on starting going to the gym.  Hypokalemia: Problem has been intermittently for a while. Labs have been done during ER visits. Hx of palpitations, cardiac work up has been otherwise negative. Negative for chest pain, dyspnea, focal weakness, or edema.  Lab Results  Component Value Date   CREATININE 0.74 05/16/2020   BUN 11 05/16/2020   NA 139 05/16/2020   K 3.2 (L) 05/16/2020   CL 106 05/16/2020   CO2 25 05/16/2020   Review of Systems  Constitutional: Negative for activity change, appetite change, fatigue and fever.  HENT: Negative for mouth sores, nosebleeds and sore throat.   Respiratory: Negative for cough and wheezing.   Gastrointestinal: Negative for abdominal pain, nausea and vomiting.       Negative for changes in bowel habits.  Endocrine: Negative for cold intolerance, heat intolerance, polydipsia, polyphagia and polyuria.  Genitourinary: Negative for decreased urine volume, hematuria and vaginal discharge.  Neurological: Negative for syncope and weakness.  Rest see pertinent positives and negatives per HPI.  Current Outpatient Medications on File Prior to Visit  Medication Sig Dispense Refill    albuterol (VENTOLIN HFA) 108 (90 Base) MCG/ACT inhaler Inhale 1-2 puffs into the lungs every 6 (six) hours as needed for wheezing or shortness of breath. 18 g 0   aspirin-acetaminophen-caffeine (EXCEDRIN MIGRAINE) 250-250-65 MG tablet Take 1 tablet every 6 (six) hours as needed by mouth. 30 tablet 0   dextromethorphan-guaiFENesin (MUCINEX DM) 30-600 MG 12hr tablet Take 1 tablet by mouth 2 (two) times daily. 20 tablet 0   Melatonin 10 MG TABS Take 1 tablet by mouth at bedtime as needed.     norgestimate-ethinyl estradiol (ORTHO-CYCLEN, 28,) 0.25-35 MG-MCG tablet Take 1 tablet by mouth daily. 28 tablet 2   [DISCONTINUED] fluticasone (FLONASE) 50 MCG/ACT nasal spray Place 2 sprays into both nostrils daily. 1 g 0   [DISCONTINUED] omeprazole (PRILOSEC) 40 MG capsule Take 1 capsule (40 mg total) by mouth daily before breakfast. 30 capsule 2   No current facility-administered medications on file prior to visit.   Past Medical History:  Diagnosis Date   Asthma    GERD (gastroesophageal reflux disease)    Migraines    with aura   STD (sexually transmitted disease)    chlamydia 05-29-2019 treated   Allergies  Allergen Reactions   Penicillins     bumps    Social History   Socioeconomic History   Marital status: Single    Spouse name: Not on file   Number of children: Not on file   Years of education: Not on file   Highest education level: Not on file  Occupational History   Not on file  Tobacco Use  Smoking status: Current Every Day Smoker    Packs/day: 0.25    Years: 7.00    Pack years: 1.75    Types: Cigarettes   Smokeless tobacco: Never Used  Vaping Use   Vaping Use: Never used  Substance and Sexual Activity   Alcohol use: No   Drug use: No   Sexual activity: Yes    Partners: Male    Birth control/protection: Injection  Other Topics Concern   Not on file  Social History Narrative   Not on file   Social Determinants of Health   Financial  Resource Strain: Not on file  Food Insecurity: Not on file  Transportation Needs: Not on file  Physical Activity: Not on file  Stress: Not on file  Social Connections: Not on file    Vitals:   10/08/20 1148  BP: 130/80  Pulse: 84  Resp: 16   Body mass index is 46.23 kg/m.  Physical Exam Vitals and nursing note reviewed.  Constitutional:      General: She is not in acute distress.    Appearance: She is well-developed.  HENT:     Head: Normocephalic and atraumatic.  Eyes:     Conjunctiva/sclera: Conjunctivae normal.  Cardiovascular:     Rate and Rhythm: Normal rate and regular rhythm.     Heart sounds: No murmur heard.   Pulmonary:     Effort: Pulmonary effort is normal. No respiratory distress.     Breath sounds: Normal breath sounds.  Abdominal:     Palpations: Abdomen is soft. There is no hepatomegaly or mass.     Tenderness: There is no abdominal tenderness.  Genitourinary:    Comments: Deferred to gyn. Lymphadenopathy:     Cervical: No cervical adenopathy.  Skin:    General: Skin is warm.     Findings: No erythema or rash.  Neurological:     Mental Status: She is alert and oriented to person, place, and time.     Cranial Nerves: No cranial nerve deficit.     Gait: Gait normal.  Psychiatric:     Comments: Well groomed, good eye contact.    ASSESSMENT AND PLAN:  Frances Murphy was seen today for follow-up.  Diagnoses and all orders for this visit: Orders Placed This Encounter  Procedures   Potassium   POCT urine pregnancy    Hypokalemia K+ rich diet recommended. Further recommendations according to K+ results.  Tobacco use disorder We discussed adverse effects of tobacco use and benefits of smoking cessation.  Because trying to get pregnant, pharmacologic treatment not recommended but we discussed some options.  Missed menses We discussed possible etiologies. Negative home pregnancy test 2 weeks ago. Pregnancy test to be done today. Most likely  caused by hormonal imbalance. Folic acid supplementation. Wt loss and smoking cessation will help. Because trying to get pregnancy hormonal treatment is not recommended. Keep appt with gyn.  Morbid obesity (HCC) She understands the benefits of wt loss as well as adverse effects of obesity. Consistency with healthy diet and physical activity recommended.  Return if symptoms worsen or fail to improve.   Guillermo Difrancesco G. [place], MD  [facility]. [place] office.   A few things to remember from today's visit:   Hypokalemia - Plan: Potassium  Tobacco use disorder  Missed menses - Plan: POCT urine pregnancy  If you need refills please call your pharmacy. Do not use My Chart to request refills or for acute issues that need immediate attention.   Folic acid 0.8  mg daily. Smoking cessation. Pending appt with gynecologist.   Please be sure medication list is accurate. If a new problem present, please set up appointment sooner than planned today.

## 2020-10-08 NOTE — Patient Instructions (Signed)
A few things to remember from today's visit:   Hypokalemia - Plan: Potassium  Tobacco use disorder  Missed menses - Plan: POCT urine pregnancy  If you need refills please call your pharmacy. Do not use My Chart to request refills or for acute issues that need immediate attention.   Folic acid 0.8 mg daily. Smoking cessation. Pending appt with gynecologist.   Please be sure medication list is accurate. If a new problem present, please set up appointment sooner than planned today.

## 2020-10-09 ENCOUNTER — Encounter: Payer: Self-pay | Admitting: Family Medicine

## 2020-10-16 ENCOUNTER — Encounter: Payer: Self-pay | Admitting: Family Medicine

## 2020-10-19 ENCOUNTER — Other Ambulatory Visit: Payer: Self-pay

## 2020-10-19 ENCOUNTER — Ambulatory Visit
Admission: EM | Admit: 2020-10-19 | Discharge: 2020-10-19 | Disposition: A | Discharge status: Home / Self Care | Payer: 59 | Attending: Emergency Medicine | Admitting: Emergency Medicine

## 2020-10-19 ENCOUNTER — Encounter: Payer: Self-pay | Admitting: *Deleted

## 2020-10-19 DIAGNOSIS — R519 Headache, unspecified: Secondary | ICD-10-CM | POA: Diagnosis not present

## 2020-10-19 DIAGNOSIS — R42 Dizziness and giddiness: Secondary | ICD-10-CM

## 2020-10-19 LAB — POCT URINE PREGNANCY: Preg Test, Ur: NEGATIVE

## 2020-10-19 MED ORDER — IBUPROFEN 800 MG PO TABS: 800.0000 mg | ORAL_TABLET | Freq: Three times a day (TID) | ORAL | 0 refills | Status: DC

## 2020-10-19 MED ORDER — KETOROLAC TROMETHAMINE 30 MG/ML IJ SOLN
30.0000 mg | Freq: Once | INTRAMUSCULAR | Status: AC
  Administered 2020-10-19: 30 mg via INTRAMUSCULAR

## 2020-10-19 MED ORDER — DEXAMETHASONE SODIUM PHOSPHATE 10 MG/ML IJ SOLN
10.0000 mg | Freq: Once | INTRAMUSCULAR | Status: AC
  Administered 2020-10-19: 10 mg via INTRAMUSCULAR

## 2020-10-19 MED ORDER — MECLIZINE HCL 25 MG PO TABS: 25.0000 mg | ORAL_TABLET | Freq: Three times a day (TID) | ORAL | 0 refills | Status: DC | PRN

## 2020-10-19 NOTE — ED Provider Notes (Signed)
[name]    CSN: [id number] Arrival date & time: 10/19/20  0955      History   Chief Complaint Chief Complaint  Patient presents with  . Hypertension    HPI Frances Murphy is a 30 y.o. female history of asthma, GERD, migraines presenting today for evaluation of headache, and dizziness. Reports yesterday noted pressure to be elevated at 146/101.  Took Excedrin yesterday and headache eased off.  Headache was different than typical migraines.  Today with waking up and sitting up caused spinning sensation and feeling off balance.  Reports 2 prior episodes of spinning this week.  Typically associated with changing position or turning head.  Reports feeling off balance.  Denies sensation of presyncope.  Has had associated nausea without vomiting.  Denies abdominal pain.  Denies recent URI symptoms or congestion.  Has mild headache today and frontal area.  Denies any weakness or difficulty speaking.  Last menstrual cycle 08/23/2020.  Reports recently came off Depo and has had issues with irregular menstrual cycles with off Depo  HPI  Past Medical History:  Diagnosis Date  . Asthma   . GERD (gastroesophageal reflux disease)   . Migraines    with aura  . STD (sexually transmitted disease)    chlamydia 05-29-2019 treated    Patient Active Problem List   Diagnosis Date Noted  . Preterm delivery, delivered 04/13/2013  . Preterm premature rupture of membranes 02/01/2013  . Premature labor 01/26/2013  . 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 Asthma 11/02/2012  . Morbid obesity (HCC) 11/02/2012  . Family history of breast cancer in mother 11/02/2012  . Current smoker 11/02/2012    Past Surgical History:  Procedure Laterality Date  . None      OB History    Gravida  1   Para  1   Term  0   Preterm  1   AB  0   Living  0     SAB  0   IAB  0   Ectopic  0   Multiple  0   Live Births  1            Home Medications    Prior to Admission  medications   Medication Sig Start Date End Date Taking? Authorizing Provider  aspirin-acetaminophen-caffeine (EXCEDRIN MIGRAINE) [id number] MG tablet Take 1 tablet every 6 (six) hours as needed by mouth. 06/11/17  Yes Ward, Chase Picket, PA-C  ibuprofen (ADVIL) 800 MG tablet Take 1 tablet (800 mg total) by mouth 3 (three) times daily. 10/19/20  Yes Quyen Cutsforth C, PA-C  meclizine (ANTIVERT) 25 MG tablet Take 1 tablet (25 mg total) by mouth 3 (three) times daily as needed for dizziness. 10/19/20  Yes Nikala Walsworth C, PA-C  Melatonin 10 MG TABS Take 1 tablet by mouth at bedtime as needed.   Yes [provider]  albuterol (VENTOLIN HFA) 108 (90 Base) MCG/ACT inhaler Inhale 1-2 puffs into the lungs every 6 (six) hours as needed for wheezing or shortness of breath. 07/09/20   Kinga Cassar C, PA-C  fluticasone (FLONASE) 50 MCG/ACT nasal spray Place 2 sprays into both nostrils daily. 02/07/19 04/19/19  Belinda Fisher, PA-C  norgestimate-ethinyl estradiol (ORTHO-CYCLEN, 28,) 0.25-35 MG-MCG tablet Take 1 tablet by mouth daily. 07/25/20 10/19/20  [place], Betty G, MD  omeprazole (PRILOSEC) 40 MG capsule Take 1 capsule (40 mg total) by mouth daily before breakfast. 05/17/20 07/09/20  [place], Betty G, MD    Family History Family History  Problem Relation Age of Onset  . Hypertension Mother   . Breast cancer Mother        in 65's  . Hypertension Father   . Hypertension Sister   . Hypertension Maternal Grandmother   . Cancer Paternal Grandmother   . Cancer Paternal Grandfather     Social History Social History   Tobacco Use  . Smoking status: Current Every Day Smoker    Packs/day: 0.25    Years: 7.00    Pack years: 1.75    Types: Cigarettes  . Smokeless tobacco: Never Used  Vaping Use  . Vaping Use: Never used  Substance Use Topics  . Alcohol use: No  . Drug use: No     Allergies   Penicillins   Review of Systems Review of Systems  Constitutional: Negative for fatigue and  fever.  HENT: Negative for congestion, sinus pressure and sore throat.   Eyes: Negative for photophobia, pain and visual disturbance.  Respiratory: Negative for cough and shortness of breath.   Cardiovascular: Negative for chest pain.  Gastrointestinal: Negative for abdominal pain, nausea and vomiting.  Genitourinary: Negative for decreased urine volume and hematuria.  Musculoskeletal: Negative for myalgias, neck pain and neck stiffness.  Neurological: Positive for dizziness, light-headedness and headaches. Negative for syncope, facial asymmetry, speech difficulty, weakness and numbness.     Physical Exam Triage Vital Signs ED Triage Vitals [10/19/20 1023]  Enc Vitals Group     BP (!) 134/93     Pulse Rate 70     Resp 16     Temp 97.9 F (36.6 C)     Temp Source Temporal     SpO2 99 %     Weight      Height      Head Circumference      Peak Flow      Pain Score 0     Pain Loc      Pain Edu?      Excl. in GC?    No data found.  Updated Vital Signs BP (!) 134/93   Pulse 70   Temp 97.9 F (36.6 C) (Temporal)   Resp 16   LMP 08/23/2020 (Exact Date) Comment: went off depo shot few months ago  SpO2 99%   Visual Acuity Right Eye Distance:   Left Eye Distance:   Bilateral Distance:    Right Eye Near:   Left Eye Near:    Bilateral Near:     Physical Exam Vitals and nursing note reviewed.  Constitutional:      Appearance: She is well-developed.     Comments: No acute distress  HENT:     Head: Normocephalic and atraumatic.     Ears:     Comments: Bilateral ears without tenderness to palpation of external auricle, tragus and mastoid, EAC's without erythema or swelling, TM's with good bony landmarks and cone of light. Non erythematous.     Nose: Nose normal.     Mouth/Throat:     Comments: Palate elevates symmetrically Eyes:     Extraocular Movements: Extraocular movements intact.     Conjunctiva/sclera: Conjunctivae normal.     Pupils: Pupils are equal, round,  and reactive to light.  Cardiovascular:     Rate and Rhythm: Normal rate and regular rhythm.     Comments: No murmur appreciated Pulmonary:     Effort: Pulmonary effort is normal. No respiratory distress.     Comments: Breathing comfortably at rest, CTABL, no wheezing, rales or other adventitious  sounds auscultated Abdominal:     General: There is no distension.  Musculoskeletal:        General: Normal range of motion.     Cervical back: Neck supple.  Skin:    General: Skin is warm and dry.  Neurological:     General: No focal deficit present.     Mental Status: She is alert and oriented to person, place, and time. Mental status is at baseline.     Comments: Patient A&O x3, cranial nerves II-XII grossly intact, strength at shoulders, hips and knees 5/5, equal bilaterally, patellar reflex 2+ bilaterally. Negative Romberg and Pronator Drift. Gait without abnormality.      UC Treatments / Results  Labs (all labs ordered are listed, but only abnormal results are displayed) Labs Reviewed  POCT URINE PREGNANCY    EKG   Radiology No results found.  Procedures Procedures (including critical care time)  Medications Ordered in UC Medications  ketorolac (TORADOL) 30 MG/ML injection 30 mg (30 mg Intramuscular Given 10/19/20 1110)  dexamethasone (DECADRON) injection 10 mg (10 mg Intramuscular Given 10/19/20 1110)    Initial Impression / Assessment and Plan / UC Course  I have reviewed the triage vital signs and the nursing notes.  Pertinent labs & imaging results that were available during my care of the patient were reviewed by me and considered in my medical decision making (see chart for details).     Blood pressure unremarkable today, slightly elevated diastolic.  Dizziness symptoms suggestive of vertigo, discussed meclizine as needed, Epley maneuver logroll maneuver at home.  Push fluids.  No neuro deficits, encouraged if symptoms progressing or worsening to follow-up in  emergency room.  Treating headache with Toradol and Decadron prior to discharge.  Encouraged to continue monitor pressure and keep a record.  Patient to follow-up if any symptoms not improving or worsening  Discussed strict return precautions. Patient verbalized understanding and is agreeable with plan.  Final Clinical Impressions(s) / UC Diagnoses   Final diagnoses:  Dizziness  Acute nonintractable headache, unspecified headache type     Discharge Instructions     Blood pressure 134/93 today Please drink plenty of fluids Please read attached on vertigo YouTube Epley maneuver/logroll or BBQ maneuver to do at home for vertigo Meclizine as needed for dizziness/nausea We gave you Toradol and Decadron today for your headache Ibuprofen for further headache relief at home Continue to monitor blood pressure Please keep an eye on symptoms over the next 2 to 3 days, if any symptoms worsening changing or not improving please follow-up    ED Prescriptions    Medication Sig Dispense Auth. Provider   meclizine (ANTIVERT) 25 MG tablet Take 1 tablet (25 mg total) by mouth 3 (three) times daily as needed for dizziness. 30 tablet Jorje Vanatta C, PA-C   ibuprofen (ADVIL) 800 MG tablet Take 1 tablet (800 mg total) by mouth 3 (three) times daily. 21 tablet Myrene Bougher, [place] C, PA-C     PDMP not reviewed this encounter.   Lew Dawes, [place] 10/19/20 1116

## 2020-10-19 NOTE — ED Triage Notes (Addendum)
C/O bad HA yesterday - checked BP = 146/101 yesterday. C/O having intermittent lightheadedness and dizziness over past few days.  Pt describes as "room is spinning".  BP today = 141/98 with "a little pressure" in head, but "not full-blown HA" today.

## 2020-10-19 NOTE — Discharge Instructions (Addendum)
Blood pressure 134/93 today Please drink plenty of fluids Please read attached on vertigo YouTube Epley maneuver/logroll or BBQ maneuver to do at home for vertigo Meclizine as needed for dizziness/nausea We gave you Toradol and Decadron today for your headache Ibuprofen for further headache relief at home Continue to monitor blood pressure Please keep an eye on symptoms over the next 2 to 3 days, if any symptoms worsening changing or not improving please follow-up

## 2020-10-21 ENCOUNTER — Ambulatory Visit (INDEPENDENT_AMBULATORY_CARE_PROVIDER_SITE_OTHER): Payer: 59 | Admitting: Family Medicine

## 2020-10-21 ENCOUNTER — Other Ambulatory Visit: Payer: Self-pay

## 2020-10-21 ENCOUNTER — Encounter: Payer: Self-pay | Admitting: Family Medicine

## 2020-10-21 VITALS — BP 130/90 | Ht 63.0 in | Wt 267.4 lb

## 2020-10-21 DIAGNOSIS — I10 Essential (primary) hypertension: Secondary | ICD-10-CM | POA: Diagnosis not present

## 2020-10-21 MED ORDER — AMLODIPINE BESYLATE 5 MG PO TABS: 5.0000 mg | ORAL_TABLET | Freq: Every day | ORAL | 5 refills | Status: DC

## 2020-10-21 NOTE — Progress Notes (Signed)
Established Patient Office Visit  Subjective:  Patient ID: Frances Murphy, female    DOB: 16-Jan-1991  Age: 30 y.o. MRN: [id number]  CC:  Chief Complaint  Patient presents with  . Hypertension    HPI Frances Murphy presents for multiple recent elevated blood pressure readings.  She states last Friday she had some headache at work.  When she went home she had blood pressure 149/101.  She has history of migraines but states this headache was somewhat different.  Over the weekend she had one reading of 189/117.  She does smoke cigarettes but is trying to wean down.  No alcohol use.  No illicit drug use.  Went to urgent care Saturday.  Her blood pressure at that point was 134/93.  She went there because of the headache and dizziness.  Still has a dull headache.  No exertional headache.  Very strong family history of hypertension in mother, father, sister, and brother.  Patient denies any recent peripheral edema.  She hopes to start weight loss program soon  Past Medical History:  Diagnosis Date  . Asthma   . GERD (gastroesophageal reflux disease)   . Migraines    with aura  . STD (sexually transmitted disease)    chlamydia 05-29-2019 treated    Past Surgical History:  Procedure Laterality Date  . None      Family History  Problem Relation Age of Onset  . Hypertension Mother   . Breast cancer Mother        in 3's  . Hypertension Father   . Hypertension Sister   . Hypertension Maternal Grandmother   . Cancer Paternal Grandmother   . Cancer Paternal Grandfather     Social History   Socioeconomic History  . Marital status: Single    Spouse name: Not on file  . Number of children: Not on file  . Years of education: Not on file  . Highest education level: Not on file  Occupational History  . Not on file  Tobacco Use  . Smoking status: Current Every Day Smoker    Packs/day: 0.25    Years: 7.00    Pack years: 1.75    Types: Cigarettes  . Smokeless tobacco: Never  Used  Vaping Use  . Vaping Use: Never used  Substance and Sexual Activity  . Alcohol use: No  . Drug use: No  . Sexual activity: Yes    Partners: Male    Birth control/protection: None  Other Topics Concern  . Not on file  Social History Narrative  . Not on file   Social Determinants of Health   Financial Resource Strain: Not on file  Food Insecurity: Not on file  Transportation Needs: Not on file  Physical Activity: Not on file  Stress: Not on file  Social Connections: Not on file  Intimate Partner Violence: Not on file    Outpatient Medications Prior to Visit  Medication Sig Dispense Refill  . albuterol (VENTOLIN HFA) 108 (90 Base) MCG/ACT inhaler Inhale 1-2 puffs into the lungs every 6 (six) hours as needed for wheezing or shortness of breath. 18 g 0  . aspirin-acetaminophen-caffeine (EXCEDRIN MIGRAINE) 250-250-65 MG tablet Take 1 tablet every 6 (six) hours as needed by mouth. 30 tablet 0  . ibuprofen (ADVIL) 800 MG tablet Take 1 tablet (800 mg total) by mouth 3 (three) times daily. 21 tablet 0  . meclizine (ANTIVERT) 25 MG tablet Take 1 tablet (25 mg total) by mouth 3 (three) times daily as needed for  dizziness. 30 tablet 0  . Melatonin 10 MG TABS Take 1 tablet by mouth at bedtime as needed.     No facility-administered medications prior to visit.    Allergies  Allergen Reactions  . Penicillins     bumps    ROS Review of Systems  Eyes: Negative for visual disturbance.  Respiratory: Negative for cough, chest tightness, shortness of breath and wheezing.   Cardiovascular: Negative for chest pain, palpitations and leg swelling.  Neurological: Positive for dizziness and headaches. Negative for seizures, syncope and weakness.      Objective:    Physical Exam Vitals reviewed.  Cardiovascular:     Rate and Rhythm: Normal rate and regular rhythm.  Pulmonary:     Effort: Pulmonary effort is normal.     Breath sounds: Normal breath sounds.  Musculoskeletal:      Cervical back: Neck supple.     Right lower leg: No edema.     Left lower leg: No edema.  Lymphadenopathy:     Cervical: No cervical adenopathy.  Neurological:     Mental Status: She is alert.     BP 130/90   Ht 5\' 3"  (1.6 m)   Wt 267 lb 7 oz (121.3 kg)   BMI 47.37 kg/m  Wt Readings from Last 3 Encounters:  10/21/20 267 lb 7 oz (121.3 kg)  10/08/20 261 lb (118.4 kg)  07/22/20 262 lb 9.6 oz (119.1 kg)     Health Maintenance Due  Topic Date Due  . COVID-19 Vaccine (1) Never done    There are no preventive care reminders to display for this patient.  Lab Results  Component Value Date   TSH 1.50 07/22/2020   Lab Results  Component Value Date   WBC 9.8 07/22/2020   HGB 13.5 07/22/2020   HCT 41.2 07/22/2020   MCV 83.9 07/22/2020   PLT 280 07/22/2020   Lab Results  Component Value Date   NA 139 05/16/2020   K 4.0 10/08/2020   CO2 25 05/16/2020   GLUCOSE 96 05/16/2020   BUN 11 05/16/2020   CREATININE 0.74 05/16/2020   BILITOT 1.0 03/07/2018   ALKPHOS 49 03/07/2018   AST 15 03/07/2018   ALT 12 03/07/2018   PROT 7.6 03/07/2018   ALBUMIN 3.9 03/07/2018   CALCIUM 8.9 05/16/2020   ANIONGAP 8 05/16/2020   GFR 103.69 10/17/2019   No results found for: CHOL No results found for: HDL No results found for: LDLCALC No results found for: TRIG No results found for: CHOLHDL No results found for: 10/19/2019    Assessment & Plan:   Hypertension.  She has elevated diastolic reading particularly on follow-up reading after rest.  She has had multiple elevated readings over several days.  We discussed options of lifestyle modification and close follow-up versus going ahead initiating medication.  Very strong family history as above.  -We did discuss nonpharmacologic management with importance of weight control, regular aerobic exercise, keeping sodium less than 2400 mg daily and hopefully can taper off cigarettes.  -Discussed going ahead and starting amlodipine 5 mg once  daily.  -Recommend close follow-up with Dr. ASTM1D in about 3 weeks to reassess  Meds ordered this encounter  Medications  . amLODipine (NORVASC) 5 MG tablet    Sig: Take 1 tablet (5 mg total) by mouth daily.    Dispense:  30 tablet    Refill:  5    Follow-up: Return in about 1 month (around 11/21/2020).    11/23/2020, MD

## 2020-10-21 NOTE — Patient Instructions (Signed)

## 2020-11-15 ENCOUNTER — Encounter: Payer: Self-pay | Admitting: Family Medicine

## 2020-11-18 ENCOUNTER — Other Ambulatory Visit: Payer: Self-pay

## 2020-11-19 ENCOUNTER — Ambulatory Visit (INDEPENDENT_AMBULATORY_CARE_PROVIDER_SITE_OTHER): Payer: 59 | Admitting: Family Medicine

## 2020-11-19 ENCOUNTER — Encounter: Payer: Self-pay | Admitting: Family Medicine

## 2020-11-19 VITALS — BP 132/84 | HR 91 | Temp 98.5°F | Resp 12 | Ht 63.0 in | Wt 265.0 lb

## 2020-11-19 DIAGNOSIS — F17200 Nicotine dependence, unspecified, uncomplicated: Secondary | ICD-10-CM | POA: Diagnosis not present

## 2020-11-19 DIAGNOSIS — I10 Essential (primary) hypertension: Secondary | ICD-10-CM | POA: Diagnosis not present

## 2020-11-19 MED ORDER — LABETALOL HCL 100 MG PO TABS: 100.0000 mg | ORAL_TABLET | Freq: Two times a day (BID) | ORAL | 1 refills | Status: DC

## 2020-11-19 MED ORDER — AMLODIPINE BESYLATE 5 MG PO TABS: 5.0000 mg | ORAL_TABLET | Freq: Every day | ORAL | 1 refills | Status: DC

## 2020-11-19 NOTE — Assessment & Plan Note (Signed)
We discussed adverse effects of tobacco use and benefits of smoking cessation. Because she is trying to get pregnant , I am not recommending pharmacologic treatment.Strongly recommend quitting before she gets pregnant.

## 2020-11-19 NOTE — Assessment & Plan Note (Signed)
BP re-checked 130/78. Goal BP < 130/80. After discussing some side effects she agrees with adding Labetalol 100 mg bid. Continue Amlodipine 5 mg daily. Instructed to monitor BP daily and to let me know about readings in 2-3 weeks. Low salt diet.

## 2020-11-19 NOTE — Patient Instructions (Addendum)
A few things to remember from today's visit:   Essential hypertension - Plan: amLODipine (NORVASC) 5 MG tablet  If you need refills please call your pharmacy. Do not use My Chart to request refills or for acute issues that need immediate attention.   Labetalol 100 mg 2 times daily added today. Check blood pressure daily, goal is under 130/80. Low salt diet. No changes in Amlodipine. Try to quit smoking before getting pregnant. Daily folic acid 0.8-1 mg.  Please be sure medication list is accurate. If a new problem present, please set up appointment sooner than planned today.

## 2020-11-19 NOTE — Progress Notes (Signed)
Frances Murphy is a 30 y.o.female, who is here today to follow on HTN. She was last seen on 10/21/20. Currently on Amlodipine 5 mg daily.. She is taking medications as instructed, no significant side effects reported.  She has not noted unusual headache, visual changes, exertional chest pain, dyspnea,  focal weakness, or edema. Home BP readings: She is not checking BP's. Lightheadedness has resolved.  Lab Results  Component Value Date   CREATININE 0.74 05/16/2020   BUN 11 05/16/2020   NA 139 05/16/2020   K 4.0 10/08/2020   CL 106 05/16/2020   CO2 25 05/16/2020   LMP in 08/2020. She has done pregnancy test at home. Still trying to get pregnant. She has an appt with her gyn tomorrow. She has not started Folic acid supplementation. Smoker, 4-5 cig/day.  Review of Systems  Constitutional: Negative for activity change, appetite change and fever.  HENT: Negative for mouth sores, nosebleeds and trouble swallowing.   Respiratory: Negative for cough and wheezing.   Gastrointestinal: Negative for abdominal pain, nausea and vomiting.       Negative for changes in bowel habits.  Genitourinary: Negative for decreased urine volume and hematuria.  Neurological: Negative for syncope, facial asymmetry and weakness.  Rest see pertinent positives and negatives per HPI.  Current Outpatient Medications on File Prior to Visit  Medication Sig Dispense Refill  . albuterol (VENTOLIN HFA) 108 (90 Base) MCG/ACT inhaler Inhale 1-2 puffs into the lungs every 6 (six) hours as needed for wheezing or shortness of breath. 18 g 0  . aspirin-acetaminophen-caffeine (EXCEDRIN MIGRAINE) 250-250-65 MG tablet Take 1 tablet every 6 (six) hours as needed by mouth. 30 tablet 0  . ibuprofen (ADVIL) 800 MG tablet Take 1 tablet (800 mg total) by mouth 3 (three) times daily. 21 tablet 0  . meclizine (ANTIVERT) 25 MG tablet Take 1 tablet (25 mg total) by mouth 3 (three) times daily as needed for dizziness. 30 tablet 0   . Melatonin 10 MG TABS Take 1 tablet by mouth at bedtime as needed.    . [DISCONTINUED] fluticasone (FLONASE) 50 MCG/ACT nasal spray Place 2 sprays into both nostrils daily. 1 g 0  . [DISCONTINUED] norgestimate-ethinyl estradiol (ORTHO-CYCLEN, 28,) 0.25-35 MG-MCG tablet Take 1 tablet by mouth daily. 28 tablet 2  . [DISCONTINUED] omeprazole (PRILOSEC) 40 MG capsule Take 1 capsule (40 mg total) by mouth daily before breakfast. 30 capsule 2   No current facility-administered medications on file prior to visit.   Past Medical History:  Diagnosis Date  . Asthma   . GERD (gastroesophageal reflux disease)   . Migraines    with aura  . STD (sexually transmitted disease)    chlamydia 05-29-2019 treated    Allergies  Allergen Reactions  . Penicillins     bumps    Social History   Socioeconomic History  . Marital status: Single    Spouse name: Not on file  . Number of children: Not on file  . Years of education: Not on file  . Highest education level: Not on file  Occupational History  . Not on file  Tobacco Use  . Smoking status: Current Every Day Smoker    Packs/day: 0.25    Years: 7.00    Pack years: 1.75    Types: Cigarettes  . Smokeless tobacco: Never Used  Vaping Use  . Vaping Use: Never used  Substance and Sexual Activity  . Alcohol use: No  . Drug use: No  . Sexual activity: Yes  Partners: Male    Birth control/protection: None  Other Topics Concern  . Not on file  Social History Narrative  . Not on file   Social Determinants of Health   Financial Resource Strain: Not on file  Food Insecurity: Not on file  Transportation Needs: Not on file  Physical Activity: Not on file  Stress: Not on file  Social Connections: Not on file   Vitals:   11/19/20 1520  BP: 132/84  Pulse: 91  Resp: 12  Temp: 98.5 F (36.9 C)  SpO2: 98%   Body mass index is 46.94 kg/m.  Physical Exam Vitals and nursing note reviewed.  Constitutional:      General: She is not  in acute distress.    Appearance: She is well-developed.  HENT:     Head: Normocephalic and atraumatic.     Mouth/Throat:     Mouth: Mucous membranes are moist.     Pharynx: Oropharynx is clear.  Eyes:     Conjunctiva/sclera: Conjunctivae normal.     Pupils: Pupils are equal, round, and reactive to light.  Cardiovascular:     Rate and Rhythm: Normal rate and regular rhythm.     Pulses:          Dorsalis pedis pulses are 2+ on the right side and 2+ on the left side.     Heart sounds: No murmur heard.   Pulmonary:     Effort: Pulmonary effort is normal. No respiratory distress.     Breath sounds: Normal breath sounds.  Abdominal:     Palpations: Abdomen is soft. There is no hepatomegaly or mass.     Tenderness: There is no abdominal tenderness.  Lymphadenopathy:     Cervical: No cervical adenopathy.  Skin:    General: Skin is warm.     Findings: No erythema or rash.  Neurological:     General: No focal deficit present.     Mental Status: She is alert and oriented to person, place, and time.     Cranial Nerves: No cranial nerve deficit.     Gait: Gait normal.  Psychiatric:     Comments: Well groomed, good eye contact.   ASSESSMENT AND PLAN:   Frances Murphy was seen today for follow-up.  Diagnoses and all orders for this visit:  Essential hypertension BP re-checked 130/78. Goal BP < 130/80. After discussing some side effects she agrees with adding Labetalol 100 mg bid. Continue Amlodipine 5 mg daily. Instructed to monitor BP daily and to let me know about readings in 2-3 weeks. Low salt diet.  Current smoker We discussed adverse effects of tobacco use and benefits of smoking cessation. Because she is trying to get pregnant , I am not recommending pharmacologic treatment.Strongly recommend quitting before she gets pregnant.  Return in about 4 months (around 03/21/2021).  Christien Frankl G. [place], MD  [facility]. [place] office.  A few things to remember from  today's visit:   Essential hypertension - Plan: amLODipine (NORVASC) 5 MG tablet  If you need refills please call your pharmacy. Do not use My Chart to request refills or for acute issues that need immediate attention.   Labetalol 100 mg 2 times daily added today. Check blood pressure daily, goal is under 130/80. Low salt diet. No changes in Amlodipine. Try to quit smoking before getting pregnant. Daily folic acid 0.8-1 mg.  Please be sure medication list is accurate. If a new problem present, please set up appointment sooner than planned today.

## 2020-11-20 ENCOUNTER — Encounter: Payer: Self-pay | Admitting: Obstetrics and Gynecology

## 2020-11-20 ENCOUNTER — Ambulatory Visit (INDEPENDENT_AMBULATORY_CARE_PROVIDER_SITE_OTHER): Payer: 59 | Admitting: Obstetrics and Gynecology

## 2020-11-20 ENCOUNTER — Other Ambulatory Visit: Payer: Self-pay

## 2020-11-20 DIAGNOSIS — Z6841 Body Mass Index (BMI) 40.0 and over, adult: Secondary | ICD-10-CM | POA: Diagnosis not present

## 2020-11-20 DIAGNOSIS — N912 Amenorrhea, unspecified: Secondary | ICD-10-CM | POA: Insufficient documentation

## 2020-11-20 DIAGNOSIS — Z3202 Encounter for pregnancy test, result negative: Secondary | ICD-10-CM

## 2020-11-20 LAB — POCT PREGNANCY, URINE: Preg Test, Ur: NEGATIVE

## 2020-11-20 NOTE — Progress Notes (Signed)
  CC:  No periods Subjective:    Patient ID: Frances Murphy, female    DOB: 1990-08-11, 30 y.o.   MRN: [id number]  HPI 30 yo G1P1, SVD seen at [facility] for amenorrhea.  Pt received last depo provera in October, was due in January. Pt notes 2 year hx of depo usage consecutively.  She has used it long periods in the past.  Pt denies spotting since the end of January.  She has noted some cramping.  The patient is interested in conceiving in the near future.  She has been placed on anti hypertensives and is currently converting to labetalol.  UPT today was negative.  Review of Systems  Constitutional: Negative.   Respiratory: Negative.   Cardiovascular: Negative.   Gastrointestinal: Negative.   Genitourinary: Positive for menstrual problem.       Objective:   Physical Exam Vitals reviewed.  Cardiovascular:     Rate and Rhythm: Normal rate and regular rhythm.     Heart sounds: Normal heart sounds.  Pulmonary:     Effort: Pulmonary effort is normal.     Breath sounds: Normal breath sounds.    Vitals:   11/20/20 0823  BP: (!) 144/89  Pulse: 93         Assessment & Plan:   1. Amenorrhea Very probable this is residual effect from her long term use of depo provera.  Discussed three treatment options.   1. Expectant management for 6 months to see if menses spontaneously return.  During that time, advised work on weight management/loss and BP control.  Diet/ exercise strategies were discussed.  Also discussed menstrual calender.    2. Provera challenge with withdrawal bleed  3. OCP, slightly concerning due to hypertension and would prefer weight loss and better BP control before starting.  Pt desires expectant management at this time.  Pt advised if she has issues or deisres the provera challenge sooner, this can be arranged.   2. BMI 45.0-49.9, adult (HCC)   Follow up in 6 months  I spent 15 minutes dedicated to the care of this patient including previsit review  of records, face to face time with the patient discussing symptom etiology, treatment plan  and post visit testing.      Warden Fillers, MD Faculty Attending, [facility]

## 2020-11-20 NOTE — Progress Notes (Signed)
Patient received last depo shot in October and was due January but decided not to get it. Patient stated that she had abromal bleeding 2 weeks prior to her Depo due date but has not bled since then.

## 2021-01-20 ENCOUNTER — Other Ambulatory Visit: Payer: Self-pay

## 2021-01-20 ENCOUNTER — Ambulatory Visit (INDEPENDENT_AMBULATORY_CARE_PROVIDER_SITE_OTHER)
Admission: EM | Admit: 2021-01-20 | Discharge: 2021-01-20 | Disposition: A | Discharge status: Home / Self Care | Payer: 59 | Source: Home / Self Care

## 2021-01-20 ENCOUNTER — Emergency Department (HOSPITAL_COMMUNITY)
Admission: EM | Admit: 2021-01-20 | Discharge: 2021-01-20 | Discharge status: Against Medical Advice | Payer: 59 | Attending: Emergency Medicine | Admitting: Emergency Medicine

## 2021-01-20 ENCOUNTER — Encounter (HOSPITAL_COMMUNITY): Payer: Self-pay

## 2021-01-20 DIAGNOSIS — K5900 Constipation, unspecified: Secondary | ICD-10-CM | POA: Diagnosis not present

## 2021-01-20 DIAGNOSIS — R6883 Chills (without fever): Secondary | ICD-10-CM | POA: Diagnosis not present

## 2021-01-20 DIAGNOSIS — R1031 Right lower quadrant pain: Secondary | ICD-10-CM

## 2021-01-20 DIAGNOSIS — Z5321 Procedure and treatment not carried out due to patient leaving prior to being seen by health care provider: Secondary | ICD-10-CM | POA: Diagnosis not present

## 2021-01-20 LAB — CBC WITH DIFFERENTIAL/PLATELET
Abs Immature Granulocytes: 0.02 10*3/uL (ref 0.00–0.07)
Basophils Absolute: 0 10*3/uL (ref 0.0–0.1)
Basophils Relative: 0 %
Eosinophils Absolute: 0.1 10*3/uL (ref 0.0–0.5)
Eosinophils Relative: 1 %
HCT: 39.5 % (ref 36.0–46.0)
Hemoglobin: 12.9 g/dL (ref 12.0–15.0)
Immature Granulocytes: 0 %
Lymphocytes Relative: 27 %
Lymphs Abs: 3.1 10*3/uL (ref 0.7–4.0)
MCH: 28.5 pg (ref 26.0–34.0)
MCHC: 32.7 g/dL (ref 30.0–36.0)
MCV: 87.2 fL (ref 80.0–100.0)
Monocytes Absolute: 0.8 10*3/uL (ref 0.1–1.0)
Monocytes Relative: 7 %
Neutro Abs: 7.4 10*3/uL (ref 1.7–7.7)
Neutrophils Relative %: 65 %
Platelets: 260 10*3/uL (ref 150–400)
RBC: 4.53 MIL/uL (ref 3.87–5.11)
RDW: 13.2 % (ref 11.5–15.5)
WBC: 11.4 10*3/uL — ABNORMAL HIGH (ref 4.0–10.5)
nRBC: 0 % (ref 0.0–0.2)

## 2021-01-20 LAB — URINALYSIS, ROUTINE W REFLEX MICROSCOPIC
Bilirubin Urine: NEGATIVE
Glucose, UA: 100 mg/dL — AB
Ketones, ur: NEGATIVE mg/dL
Nitrite: POSITIVE — AB
Protein, ur: 30 mg/dL — AB
Specific Gravity, Urine: 1.02 (ref 1.005–1.030)
pH: 6 (ref 5.0–8.0)

## 2021-01-20 LAB — COMPREHENSIVE METABOLIC PANEL
ALT: 13 U/L (ref 0–44)
AST: 17 U/L (ref 15–41)
Albumin: 3.8 g/dL (ref 3.5–5.0)
Alkaline Phosphatase: 43 U/L (ref 38–126)
Anion gap: 9 (ref 5–15)
BUN: 8 mg/dL (ref 6–20)
CO2: 24 mmol/L (ref 22–32)
Calcium: 9.1 mg/dL (ref 8.9–10.3)
Chloride: 105 mmol/L (ref 98–111)
Creatinine, Ser: 0.87 mg/dL (ref 0.44–1.00)
GFR, Estimated: >60 mL/min (ref >60)
Glucose, Bld: 94 mg/dL (ref 70–99)
Potassium: 3.4 mmol/L — ABNORMAL LOW (ref 3.5–5.1)
Sodium: 138 mmol/L (ref 135–145)
Total Bilirubin: 0.6 mg/dL (ref 0.3–1.2)
Total Protein: 7.5 g/dL (ref 6.5–8.1)

## 2021-01-20 LAB — POCT URINALYSIS DIP (MANUAL ENTRY)
Bilirubin, UA: NEGATIVE
Glucose, UA: NEGATIVE mg/dL
Ketones, POC UA: NEGATIVE mg/dL
Nitrite, UA: POSITIVE — AB
Protein Ur, POC: 30 mg/dL — AB
Spec Grav, UA: 1.02 (ref 1.010–1.025)
Urobilinogen, UA: 0.2 E.U./dL
pH, UA: 5.5 (ref 5.0–8.0)

## 2021-01-20 LAB — URINALYSIS, MICROSCOPIC (REFLEX): WBC, UA: >50 WBC/hpf (ref 0–5)

## 2021-01-20 LAB — PREGNANCY, URINE: Preg Test, Ur: NEGATIVE

## 2021-01-20 MED ORDER — SULFAMETHOXAZOLE-TRIMETHOPRIM 800-160 MG PO TABS: 1.0000 | ORAL_TABLET | Freq: Two times a day (BID) | ORAL | 0 refills | Status: AC

## 2021-01-20 NOTE — Discharge Instructions (Addendum)
Begin Bactrim twice daily for 1 week to treat UTI Drink plenty of fluids Urine culture pending Continue drink plenty of water and fiber to help with constipation  Please use Miralax for moderate to severe constipation. Take this once a day for the next 2-3 days. Please also start docusate stool softener, twice a day for at least 1 week. If stools become loose, cut down to once a day for another week. If stools remain loose, cut back to 1 pill every other day for a third week. You can stop docusate thereafter and resume as needed for constipation.  To help reduce constipation and promote bowel health: 1. Drink at least 64 ounces of water each day 2. Eat plenty of fiber (fruits, vegetables, whole grains, legumes) 3. Be physically active or exercise including walking, jogging, swimming, yoga, etc. 4. For active constipation use a stool softener (docusate) or an osmotic laxative (like Miralax) each day, or as needed.

## 2021-01-20 NOTE — ED Provider Notes (Signed)
[name]    CSN: [id number] Arrival date & time: 01/20/21  1308      History   Chief Complaint Chief Complaint  Patient presents with   Abdominal Pain    HPI Frances Murphy is a 30 y.o. female history of asthma, GERD, migraines presenting today for evaluation of abdominal pain.  Reports that she began to develop right-sided abdominal pain initially thought related to constipation as she had not had a bowel movement in a few days.  Reports typically will have daily bowels.  Took Dulcolax, felt pain worsened, went to emergency room had blood work and urine drawn.  Had diarrhea while in the emergency room and pain slightly improved.  Has continued to have bouts of loose stools since and reports pain overall is improved, but is still at approximately 5 out of 10.  Pain mainly on the right side and in the lower abdomen.  Denies any urinary symptoms.  Urine in emergency room did have positive nitrites and large leuks.  Blood work with normal LFTs, white count of 11.  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 Authorizing Provider  sulfamethoxazole-trimethoprim (BACTRIM DS) 800-160 MG tablet Take 1 tablet by mouth 2 (two) times daily for 7 days. 01/20/21 01/27/21 Yes Lakira Ogando C, PA-C  albuterol (VENTOLIN HFA) 108 (90 Base) MCG/ACT inhaler Inhale 1-2 puffs into the lungs every 6 (six) hours as needed for wheezing or shortness of breath. 07/09/20   Ameera Tigue C, PA-C  amLODipine (NORVASC) 5 MG tablet Take 1 tablet (5 mg total) by mouth daily. 11/19/20   [place], Betty G, MD  aspirin-acetaminophen-caffeine (EXCEDRIN MIGRAINE) [phone] MG tablet Take 1 tablet every 6 (six) hours as needed by mouth. Patient not taking: Reported on 11/20/2020 06/11/17   Ward, Chase Picket, PA-C  labetalol (NORMODYNE) 100 MG tablet Take 1 tablet (100 mg total) by mouth 2 (two) times daily. Patient not taking: Reported on 11/20/2020 11/19/20   [place], Betty G, MD  Melatonin 10 MG TABS Take 1 tablet by mouth at bedtime as needed.    [provider]  fluticasone (FLONASE) 50 MCG/ACT nasal spray Place 2 sprays into both nostrils daily. 02/07/19 04/19/19  Belinda Fisher,  PA-C  norgestimate-ethinyl estradiol (ORTHO-CYCLEN, 28,) 0.25-35 MG-MCG tablet Take 1 tablet by mouth daily. 07/25/20 10/19/20  [place], Betty G, MD  omeprazole (PRILOSEC) 40 MG capsule Take 1 capsule (40 mg total) by mouth daily before breakfast. 05/17/20 07/09/20  [place], Betty G, MD    Family History Family History  Problem Relation Age of Onset   Hypertension Mother    Breast cancer Mother        in 43's   Hypertension Father    Hypertension Sister    Hypertension Maternal Grandmother    Cancer Paternal Grandmother    Cancer Paternal Grandfather     Social History Social History   Tobacco Use   Smoking status: Every Day    Packs/day: 0.25    Years: 7.00    Pack years: 1.75    Types: Cigarettes   Smokeless tobacco: Never  Vaping Use   Vaping Use:  Never used  Substance Use Topics   Alcohol use: No   Drug use: No     Allergies   Penicillins   Review of Systems Review of Systems  Constitutional:  Negative for fever.  Respiratory:  Negative for shortness of breath.   Cardiovascular:  Negative for chest pain.  Gastrointestinal:  Positive for abdominal pain. Negative for diarrhea, nausea and vomiting.  Genitourinary:  Negative for dysuria, flank pain, genital sores, hematuria, menstrual problem, vaginal bleeding, vaginal discharge and vaginal pain.  Musculoskeletal:  Negative for back pain.  Skin:  Negative for rash.  Neurological:  Negative for dizziness, light-headedness and headaches.    Physical Exam Triage Vital Signs ED Triage Vitals  Enc Vitals Group     BP 01/20/21 1413 117/79     Pulse Rate 01/20/21 1413 87     Resp 01/20/21 1413 18     Temp 01/20/21 1413 98.3 F (36.8 C)     Temp Source 01/20/21 1413 Oral     SpO2 01/20/21 1413 96 %     Weight --      Height --      Head Circumference --      Peak Flow --      Pain Score 01/20/21 1418 6     Pain Loc --      Pain Edu? --      Excl. in GC? --    No data found.  Updated Vital Signs BP 117/79 (BP Location: Left Arm)   Pulse 87   Temp 98.3 F (36.8 C) (Oral)   Resp 18   SpO2 96%   Visual Acuity Right Eye Distance:   Left Eye Distance:   Bilateral Distance:    Right Eye Near:   Left Eye Near:    Bilateral Near:     Physical Exam Vitals and nursing note reviewed.  Constitutional:      Appearance: She is well-developed.     Comments: No acute distress  HENT:     Head: Normocephalic and atraumatic.     Nose: Nose normal.  Eyes:     Conjunctiva/sclera: Conjunctivae normal.  Cardiovascular:     Rate and Rhythm: Normal rate.  Pulmonary:     Effort: Pulmonary effort is normal. No respiratory distress.  Abdominal:     General: There is no distension.     Comments: Soft, nondistended, tender to palpation to right upper and lower quadrant,  slightly more focal in lower and right flank  Musculoskeletal:        General: Normal range of motion.  Cervical back: Neck supple.  Skin:    General: Skin is warm and dry.  Neurological:     Mental Status: She is alert and oriented to person, place, and time.     UC Treatments / Results  Labs (all labs ordered are listed, but only abnormal results are displayed) Labs Reviewed  POCT URINALYSIS DIP (MANUAL ENTRY) - Abnormal; Notable for the following components:      Result Value   Clarity, UA cloudy (*)    Blood, UA large (*)    Protein Ur, POC =30 (*)    Nitrite, UA Positive (*)    Leukocytes, UA Moderate (2+) (*)    All other components within normal limits  URINE CULTURE    EKG   Radiology No results found.  Procedures Procedures (including critical care time)  Medications Ordered in UC Medications - No data to display  Initial Impression / Assessment and Plan / UC Course  I have reviewed the triage vital signs and the nursing notes.  Pertinent labs & imaging results that were available during my care of the patient were reviewed by me and considered in my medical decision making (see chart for details).     Repeat urine today still suggestive of UTI, will send for culture, empirically treating with Bactrim, also discussed continued constipation recommendations.  Monitor for continued resolution of symptoms.  Discussed strict return precautions. Patient verbalized understanding and is agreeable with plan.  Final Clinical Impressions(s) / UC Diagnoses   Final diagnoses:  Right lower quadrant abdominal pain     Discharge Instructions      Begin Bactrim twice daily for 1 week to treat UTI Drink plenty of fluids Urine culture pending Continue drink plenty of water and fiber to help with constipation  Please use Miralax for moderate to severe constipation. Take this once a day for the next 2-3 days. Please also start docusate stool softener, twice a  day for at least 1 week. If stools become loose, cut down to once a day for another week. If stools remain loose, cut back to 1 pill every other day for a third week. You can stop docusate thereafter and resume as needed for constipation.  To help reduce constipation and promote bowel health: 1. Drink at least 64 ounces of water each day 2. Eat plenty of fiber (fruits, vegetables, whole grains, legumes) 3. Be physically active or exercise including walking, jogging, swimming, yoga, etc. 4. For active constipation use a stool softener (docusate) or an osmotic laxative (like Miralax) each day, or as needed.     ED Prescriptions     Medication Sig Dispense Auth. Provider   sulfamethoxazole-trimethoprim (BACTRIM DS) 800-160 MG tablet Take 1 tablet by mouth 2 (two) times daily for 7 days. 14 tablet Nani Ingram, [place] C, PA-C      PDMP not reviewed this encounter.   Lew Dawes, [place] 01/20/21 1542

## 2021-01-20 NOTE — ED Notes (Signed)
Patient called x2 for IV placement for CT without answer

## 2021-01-20 NOTE — ED Triage Notes (Signed)
Onset yesterday of abdominal pain. Pt thought constipation could of been the cause, so she took a laxative. Her pain increased and she went to be evaluated at the ED. After arriving she had a BM and felt a decrease in sxs. She then left w/o being treated due to wait time. She continues to have some abdominal pain. Denies emesis and urinary urgency.

## 2021-01-20 NOTE — ED Provider Notes (Signed)
Emergency Medicine Provider Triage Evaluation Note  Frances Murphy , a 30 y.o. female  was evaluated in triage.  Pt complains of RLQ abdominal pain.  She reports that the symptoms started yesterday.  She denies fever, vomiting, or diarrhea.  States she did have some blood in her urine.  States that the pain is moderate. Rates pain as an 8/10.  Review of Systems  Positive: Abdominal pain Negative: Vomiting, diarrhea  Physical Exam  BP 123/76 (BP Location: Right Arm)   Pulse 91   Temp 98.9 F (37.2 C)   Resp 18   SpO2 98%  Gen:   Awake, no distress   Resp:  Normal effort  MSK:   Moves extremities without difficulty  Other:  Mild lower abdominal discomfort  Medical Decision Making  Medically screening exam initiated at 2:24 AM.  Appropriate orders placed.  Frances Murphy was informed that the remainder of the evaluation will be completed by another provider, this initial triage assessment does not replace that evaluation, and the importance of remaining in the ED until their evaluation is complete.  Lower abdominal pain since yesterday.  Reproducible with palpation.  Will check imaging and labs.   Frances Horseman, PA-C 01/20/21 2297    Frances Murphy, April, MD 01/20/21 Frances Murphy

## 2021-01-20 NOTE — ED Notes (Signed)
Pt called for IV placement with no response.

## 2021-01-20 NOTE — ED Triage Notes (Signed)
Patient BIB [facility] from home, abdominal pain R sided x 1 day, denies N/V/D or fever, reports some chills, also with constipation

## 2021-01-22 ENCOUNTER — Encounter: Payer: Self-pay | Admitting: Family Medicine

## 2021-01-22 DIAGNOSIS — I10 Essential (primary) hypertension: Secondary | ICD-10-CM

## 2021-01-22 LAB — URINE CULTURE: Culture: >=100000 — AB

## 2021-01-22 MED ORDER — LABETALOL HCL 100 MG PO TABS: 100.0000 mg | ORAL_TABLET | Freq: Two times a day (BID) | ORAL | 1 refills | Status: DC

## 2021-02-28 ENCOUNTER — Ambulatory Visit
Admission: EM | Admit: 2021-02-28 | Discharge: 2021-02-28 | Disposition: A | Discharge status: Home / Self Care | Payer: 59 | Attending: Physician Assistant | Admitting: Physician Assistant

## 2021-02-28 DIAGNOSIS — R6883 Chills (without fever): Secondary | ICD-10-CM | POA: Diagnosis present

## 2021-02-28 DIAGNOSIS — R350 Frequency of micturition: Secondary | ICD-10-CM | POA: Diagnosis present

## 2021-02-28 DIAGNOSIS — M545 Low back pain, unspecified: Secondary | ICD-10-CM

## 2021-02-28 LAB — POCT URINALYSIS DIP (MANUAL ENTRY)
Bilirubin, UA: NEGATIVE
Glucose, UA: NEGATIVE mg/dL
Leukocytes, UA: NEGATIVE
Nitrite, UA: NEGATIVE
Protein Ur, POC: 30 mg/dL — AB
Spec Grav, UA: >=1.03 — AB (ref 1.010–1.025)
Urobilinogen, UA: 0.2 E.U./dL
pH, UA: 5.5 (ref 5.0–8.0)

## 2021-02-28 LAB — POCT URINE PREGNANCY: Preg Test, Ur: NEGATIVE

## 2021-02-28 MED ORDER — CLINDAMYCIN HCL 300 MG PO CAPS: 300.0000 mg | ORAL_CAPSULE | Freq: Three times a day (TID) | ORAL | 0 refills | Status: DC

## 2021-02-28 NOTE — ED Provider Notes (Signed)
[name]    CSN: [id number] Arrival date & time: 02/28/21  1244      History   Chief Complaint Chief Complaint  Patient presents with   Fever    HPI Frances Murphy is a 30 y.o. female.   Patient presents today with a several day history of chills.  She believes that she had a fever but did not measure this.  Symptoms began after she had tooth extracted a few days ago.  She has been taking over-the-counter medications including Tylenol and Motrin without improvement of symptoms.  She denies any significant pain from her tooth was extracted but does have some tenderness with mastication.  She reports lower back pain which is rated 6 on a 0-10 pain scale, localized to lower back without radiation, described as aching, no aggravating leaving factors identified.  She denies any urinary symptoms.  Does report she was treated for UTI several months ago but does not member the name of this antibiotic.  Denies additional antibiotic use since that time.  She has not had COVID-19 vaccination.  She denies any known sick contacts.  Denies any bowel/bladder incontinence, lower extremity weakness, saddle anesthesia.   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 Authorizing Provider  clindamycin (CLEOCIN) 300 MG capsule Take 1 capsule (300 mg total) by mouth 3 (three) times daily. 02/28/21  Yes Taelyn Nemes K, PA-C  albuterol (VENTOLIN HFA) 108 (90 Base) MCG/ACT inhaler Inhale 1-2 puffs into the lungs every 6 (six) hours as needed for wheezing or shortness of breath. 07/09/20   Wieters, Hallie C, PA-C  amLODipine (NORVASC) 5 MG tablet Take 1 tablet (5 mg total) by mouth daily. 11/19/20   [place], Betty G, MD  aspirin-acetaminophen-caffeine (EXCEDRIN MIGRAINE) [phone] MG tablet Take 1 tablet every 6 (six) hours as needed by mouth. Patient not taking: Reported on 11/20/2020 06/11/17   Ward, Chase Picket, PA-C  labetalol (NORMODYNE) 100 MG tablet Take 1 tablet (100 mg total) by mouth 2 (two) times daily. 01/22/21   [place], Betty G, MD  Melatonin 10 MG TABS Take 1 tablet by mouth at bedtime as needed.    [provider]  fluticasone (FLONASE) 50 MCG/ACT nasal spray Place 2 sprays  into both nostrils daily. 02/07/19 04/19/19  Belinda FisherYu, Amy V, PA-C  norgestimate-ethinyl estradiol (ORTHO-CYCLEN, 28,) 0.25-35 MG-MCG tablet Take 1 tablet by mouth daily. 07/25/20 10/19/20  [place], Betty G, MD  omeprazole (PRILOSEC) 40 MG capsule Take 1 capsule (40 mg total) by mouth daily before breakfast. 05/17/20 07/09/20  [place], Betty G, MD    Family History Family History  Problem Relation Age of Onset   Hypertension Mother    Breast cancer Mother        in 8050's   Hypertension Father    Hypertension Sister    Hypertension Maternal Grandmother    Cancer Paternal Grandmother    Cancer Paternal Grandfather     Social History Social History   Tobacco Use   Smoking status: Every Day    Packs/day: 0.25    Years: 7.00    Pack years: 1.75    Types:  Cigarettes   Smokeless tobacco: Never  Vaping Use   Vaping Use: Never used  Substance Use Topics   Alcohol use: No   Drug use: No     Allergies   Penicillins   Review of Systems Review of Systems  Constitutional:  Positive for chills. Negative for activity change, appetite change, fatigue and fever.  HENT:  Positive for congestion and dental problem. Negative for sinus pressure, sneezing and sore throat.   Respiratory:  Negative for cough and shortness of breath.   Cardiovascular:  Negative for chest pain.  Gastrointestinal:  Negative for abdominal pain, diarrhea, nausea and vomiting.  Musculoskeletal:  Negative for arthralgias and myalgias.  Neurological:  Negative for dizziness, light-headedness and headaches.    Physical Exam Triage Vital Signs ED Triage Vitals [02/28/21 1415]  Enc Vitals Group     BP 104/71     Pulse Rate 83     Resp 18     Temp 98 F (36.7 C)     Temp Source Oral     SpO2 95 %     Weight      Height      Head Circumference      Peak Flow      Pain Score 6     Pain Loc      Pain Edu?      Excl. in GC?    No data found.  Updated Vital Signs BP 104/71 (BP Location: Right Arm)   Pulse 83   Temp 98 F (36.7 C) (Oral)   Resp 18   SpO2 95%   Visual Acuity Right Eye Distance:   Left Eye Distance:   Bilateral Distance:    Right Eye Near:   Left Eye Near:    Bilateral Near:     Physical Exam Vitals reviewed.  Constitutional:      General: She is awake. She is not in acute distress.    Appearance: Normal appearance. She is normal weight. She is not ill-appearing.     Comments: Very pleasant female appears stated age in no acute distress sitting comfortably in exam room  HENT:     Head: Normocephalic and atraumatic.     Right Ear: Tympanic membrane, ear canal and external ear normal. Tympanic membrane is not erythematous or bulging.     Left Ear: Tympanic membrane, ear canal and external ear normal. Tympanic membrane is not  erythematous or bulging.     Nose:     Right Sinus: No maxillary sinus tenderness or frontal sinus tenderness.     Left Sinus: No maxillary sinus tenderness  or frontal sinus tenderness.     Mouth/Throat:     Mouth: Mucous membranes are moist.     Dentition: Abnormal dentition. Dental tenderness and gingival swelling present.     Pharynx: Uvula midline. No oropharyngeal exudate or posterior oropharyngeal erythema.     Tonsils: No tonsillar exudate or tonsillar abscesses. 2+ on the right. 2+ on the left.      Comments: No evidence of Ludwig angina Cardiovascular:     Rate and Rhythm: Normal rate and regular rhythm.     Heart sounds: Normal heart sounds, S1 normal and S2 normal. No murmur heard. Pulmonary:     Effort: Pulmonary effort is normal.     Breath sounds: Normal breath sounds. No wheezing, rhonchi or rales.     Comments: Clear to auscultation bilaterally Lymphadenopathy:     Head:     Right side of head: No submental, submandibular or tonsillar adenopathy.     Left side of head: No submental, submandibular or tonsillar adenopathy.     Cervical: No cervical adenopathy.  Psychiatric:        Behavior: Behavior is cooperative.     UC Treatments / Results  Labs (all labs ordered are listed, but only abnormal results are displayed) Labs Reviewed  POCT URINALYSIS DIP (MANUAL ENTRY) - Abnormal; Notable for the following components:      Result Value   Clarity, UA cloudy (*)    Ketones, POC UA trace (5) (*)    Spec Grav, UA >=1.030 (*)    Blood, UA moderate (*)    Protein Ur, POC =30 (*)    All other components within normal limits  URINE CULTURE  POCT URINE PREGNANCY    EKG   Radiology No results found.  Procedures Procedures (including critical care time)  Medications Ordered in UC Medications - No data to display  Initial Impression / Assessment and Plan / UC Course  I have reviewed the triage vital signs and the nursing notes.  Pertinent labs & imaging  results that were available during my care of the patient were reviewed by me and considered in my medical decision making (see chart for details).      Dental abscess noted on exam.  Patient has an allergy to penicillin so we will start clindamycin.  UA obtained given lower back pain showed no evidence of infection but did show some hematuria.  We will send off for culture to ensure additional antibiotic treatment is not required.  Recommend she rest and drink plenty of fluid.  She was instructed to alternate over-the-counter medications for symptom management.  Discussed alarm symptoms that warrant emergent evaluation.  Strict return precautions given to which patient expressed understanding.  Final Clinical Impressions(s) / UC Diagnoses   Final diagnoses:  Chills  Acute bilateral low back pain without sciatica  Urinary frequency     Discharge Instructions      We are going to cover you for dental abscess given the chills and your symptoms.  Please start clindamycin 300 mg 3 times a day to cover for this infection.  We are going to send your urine off for culture and if need to change antibiotic we will contact you.  Please make sure you drink plenty of fluid and alternate Tylenol ibuprofen for symptom relief.  If anything worsens or you develop any difficulty speaking, difficulty swallowing, shortness of breath, nausea, vomiting you need to be evaluated immediately.     ED Prescriptions     Medication Sig  Dispense Auth. Provider   clindamycin (CLEOCIN) 300 MG capsule Take 1 capsule (300 mg total) by mouth 3 (three) times daily. 21 capsule Hamzah Savoca K, PA-C      PDMP not reviewed this encounter.   Jeani Hawking, PA-C 02/28/21 1526

## 2021-02-28 NOTE — ED Triage Notes (Signed)
Pt states had a tooth extraction on Wednesday and developed a fever yesterday. States having lower back pain radiating to both hips since this am. States last motrin was 11am.

## 2021-02-28 NOTE — Discharge Instructions (Addendum)
We are going to cover you for dental abscess given the chills and your symptoms.  Please start clindamycin 300 mg 3 times a day to cover for this infection.  We are going to send your urine off for culture and if need to change antibiotic we will contact you.  Please make sure you drink plenty of fluid and alternate Tylenol ibuprofen for symptom relief.  If anything worsens or you develop any difficulty speaking, difficulty swallowing, shortness of breath, nausea, vomiting you need to be evaluated immediately.

## 2021-03-02 LAB — URINE CULTURE

## 2021-03-11 ENCOUNTER — Other Ambulatory Visit: Payer: Self-pay

## 2021-03-12 ENCOUNTER — Ambulatory Visit: Payer: 59 | Admitting: Family Medicine

## 2021-03-12 NOTE — Progress Notes (Deleted)
No chief complaint on file.   HPI:  [name] is a 30 y.o. female, who is here today with above complaint. ***  Review of Systems  Constitutional: Negative for activity change, appetite change and fatigue. *** HENT: Negative for mouth sores, nosebleeds and sore throat.   Eyes: Negative for redness and visual disturbance.  Respiratory: Negative for cough and wheezing.  *** Cardiovascular: Negative for chest pain and palpitations. *** Gastrointestinal: Negative for abdominal pain, nausea and vomiting.       Negative for changes in bowel habits.  Genitourinary: Negative for decreased urine volume and hematuria.  Musculoskeletal: Negative for gait problem and myalgias.  Skin: Negative for rash and wound. *** Allergic/Immunologic: Negative for environmental allergies. *** Neurological: Negative for syncope, facial asymmetry, speech difficulty, weakness and headaches.  Hematological: Negative for adenopathy. Does not bruise/bleed easily. *** Psychiatric/Behavioral: Negative for confusion. The patient is not nervous/anxious.    Current Outpatient Medications on File Prior to Visit  Medication Sig Dispense Refill   albuterol (VENTOLIN HFA) 108 (90 Base) MCG/ACT inhaler Inhale 1-2 puffs into the lungs every 6 (six) hours as needed for wheezing or shortness of breath. 18 g 0   amLODipine (NORVASC) 5 MG tablet Take 1 tablet (5 mg total) by mouth daily. 90 tablet 1   aspirin-acetaminophen-caffeine (EXCEDRIN MIGRAINE) 250-250-65 MG tablet Take 1 tablet every 6 (six) hours as needed by mouth. (Patient not taking: Reported on 11/20/2020) 30 tablet 0   clindamycin (CLEOCIN) 300 MG capsule Take 1 capsule (300 mg total) by mouth 3 (three) times daily. 21 capsule 0   labetalol (NORMODYNE) 100 MG tablet Take 1 tablet (100 mg total) by mouth 2 (two) times daily. 60 tablet 1   Melatonin 10 MG TABS Take 1 tablet by mouth at bedtime as needed.     [DISCONTINUED] fluticasone (FLONASE) 50  MCG/ACT nasal spray Place 2 sprays into both nostrils daily. 1 g 0   [DISCONTINUED] norgestimate-ethinyl estradiol (ORTHO-CYCLEN, 28,) 0.25-35 MG-MCG tablet Take 1 tablet by mouth daily. 28 tablet 2   [DISCONTINUED] omeprazole (PRILOSEC) 40 MG capsule Take 1 capsule (40 mg total) by mouth daily before breakfast. 30 capsule 2   No current facility-administered medications on file prior to visit.    Past Medical History:  Diagnosis Date   Asthma    GERD (gastroesophageal reflux disease)    Migraines    with aura   STD (sexually transmitted disease)    chlamydia 05-29-2019 treated   Allergies  Allergen Reactions   Penicillins     bumps    Social History   Socioeconomic History   Marital status: Single    Spouse name: Not on file   Number of children: Not on file   Years of education: Not on file   Highest education level: Not on file  Occupational History   Not on file  Tobacco Use   Smoking status: Every Day    Packs/day: 0.25    Years: 7.00    Pack years: 1.75    Types: Cigarettes   Smokeless tobacco: Never  Vaping Use   Vaping Use: Never used  Substance and Sexual Activity   Alcohol use: No   Drug use: No   Sexual activity: Yes    Partners: Male    Birth control/protection: None  Other Topics Concern   Not on file  Social History Narrative   Not on file   Social Determinants of Health   Financial Resource Strain: Not on file  Food Insecurity: Not on file  Transportation Needs: Not on file  Physical Activity: Not on file  Stress: Not on file  Social Connections: Not on file    There were no vitals filed for this visit. There is no height or weight on file to calculate BMI.  Physical Exam  Nursing note and vitals reviewed. Constitutional: She is oriented to person, place, and time. She appears well-developed. No distress.  HENT:  Head: Normocephalic and atraumatic.  Mouth/Throat: Oropharynx is clear and moist and mucous membranes are normal.  Eyes:  Pupils are equal, round, and reactive to light. Conjunctivae are normal.  Cardiovascular: Normal rate and regular rhythm.  No murmur heard. Pulses:      Dorsalis pedis pulses are 2+ on the right side and 2+ on the left side. *** Respiratory: Effort normal and breath sounds normal. No respiratory distress.  GI: Soft. She exhibits no mass. There is no hepatomegaly. There is no abdominal tenderness.  Musculoskeletal:        General: No edema. *** Lymphadenopathy:    She has no cervical adenopathy.  Neurological: She is alert and oriented to person, place, and time. She has normal strength. No cranial nerve deficit. Gait normal.  Reflex Scores:      Bicep reflexes are 2+ on the right side and 2+ on the left side.***      Patellar reflexes are 2+ on the right side and 2+ on the left side.*** Skin: Skin is warm. No rash noted. No erythema.  Psychiatric: She has a normal mood and affect.  Well groomed, good eye contact.    ASSESSMENT AND PLAN:  There are no diagnoses linked to this encounter.  No orders of the defined types were placed in this encounter.   No follow-ups on file.  Betty G. [place], MD  [facility] [place]. [place] [facility].

## 2021-03-12 NOTE — Progress Notes (Signed)
Chief Complaint  Patient presents with   Palpitations    Happening again, getting worse & more at night.    urinary problem    Culture needs to be repeated per note from UC lab.   HPI: [name] is a 30 y.o. female, who is here today with above complaint. She was evaluated in the ER on 02/28/21 because chills and back pain. Urine dipstick abnormal.  Component     Latest Ref Rng & Units 02/28/2021  Color, UA      yellow  Clarity, UA      cloudy (A)  Glucose     Negative negative  Bilirubin, UA      negative  Ketones, UA      trace (5) (A)  Specific Gravity, UA     1.010 - 1.025 >=1.030 (A)  RBC, UA      moderate (A)  pH, UA     5.0 - 8.0 5.5  Protein,UA     Negative =30 (A)  Urobilinogen, UA     0.2 or 1.0 E.U./dL 0.2  Nitrite, UA      Negative  Leukocytes,UA     Negative Negative   She was started on Clindamycin for dental infection after tooth extraction. She is taking abx, taking it bid. She has not had toothache. She contacted her dentist and was told as far as she has no pain, she does not need to follow. Chills and fever thought to be caused by infectious process.  COVID 19 infection among co-workers.  Negative COVID 19 test. Symptoms have resolved.  Ucx 02/28/21:  Specimen Description URINE, CLEAN CATCH   Special Requests NONE  Performed at [facility], [address] [place], [place] [address]   Culture MULTIPLE SPECIES PRESENT, SUGGEST RECOLLECTION Abnormal    Report Status 03/02/2021 FINAL       Denies dysuria,increased urinary frequency, gross hematuria,or decreased urine output.  Yesterday she was having "serious cramping",worse than usual. Brownish discharge, like "old blood." She has had a menstrual cycle since she stopped OCP's, 07/2020. She is still trying to get pregnant.  She saw her gyn in 11/2020, next appt in 05/2021. According to pt, she was also recommended to lose 30 Lb.  Palpitations: She feels like fluttery  in her chest, she types it out and seems 2 beats follow by a pause. Usually when lying down, trying to go back to sleep. She does not know how long it lasts, she falls asleep. Problem is not associated with exertion.  No associated CP,diaphoresis,wheezing,SOB,or cough. It seems to be worse when smoking. She has been evaluated by cardiologist. On 11/10/19 she had echo LVEF 60-65%,mild LVH. . Cardiac monitor on 11/14/20:Sinus rhythm with minimum heart rate 61, average 92 bpm, maximum 160 bpm.  Rare PVCs are noted, rare ventricular bigeminy and trigeminy pattern noted. Isolated PVC was symptomatic at one point.  No atrial fibrillation, no adverse arrhythmias.  She had a moderate headache once time after smoking, so she is trying to quit smoking. She has hx of migraines, has not had one since she started BB. Since she had dental extraction  (02/26/21)she has had "something", it is "not like a full blood HA" but like pressure, 6/10, everywhere. AM sometimes and daily at 7:30 pm. She has not identified exacerbating factors. Alleviated by taking Advil 100 mg x 3.  HTN on Amlodipine 5 mg daily and Labetalol 200 mg bid. She is not checking her BP frequently. Negative for  visual changes, claudication, focal weakness, or edema.  Lab Results  Component Value Date   CREATININE 0.87 01/20/2021   BUN 8 01/20/2021   NA 138 01/20/2021   K 3.4 (L) 01/20/2021   CL 105 01/20/2021   CO2 24 01/20/2021   Review of Systems  Constitutional: Negative for activity change, appetite change. Positive for fatigue.  HENT: Negative for mouth sores, nosebleeds and sore throat.   Gastrointestinal: Negative for abdominal pain, nausea and vomiting.       Negative for changes in bowel habits.  Genitourinary: Negative for decreased urine volume and hematuria.  Musculoskeletal: Negative for gait problem and myalgias.  Skin: Negative for rash and wound.  Allergic/Immunologic: Positive for environmental allergies.   Neurological: Negative for syncope, facial asymmetry, or speech difficulty. Psychiatric/Behavioral: Negative for confusion. The patient is nervous/anxious.    Rest of ROS pertinent positives and negative in HPI.  Current Outpatient Medications on File Prior to Visit  Medication Sig Dispense Refill   albuterol (VENTOLIN HFA) 108 (90 Base) MCG/ACT inhaler Inhale 1-2 puffs into the lungs every 6 (six) hours as needed for wheezing or shortness of breath. 18 g 0   amLODipine (NORVASC) 5 MG tablet Take 1 tablet (5 mg total) by mouth daily. 90 tablet 1   clindamycin (CLEOCIN) 300 MG capsule Take 1 capsule (300 mg total) by mouth 3 (three) times daily. 21 capsule 0   labetalol (NORMODYNE) 100 MG tablet Take 1 tablet (100 mg total) by mouth 2 (two) times daily. 60 tablet 1   Melatonin 10 MG TABS Take 1 tablet by mouth at bedtime as needed.     [DISCONTINUED] fluticasone (FLONASE) 50 MCG/ACT nasal spray Place 2 sprays into both nostrils daily. 1 g 0   [DISCONTINUED] norgestimate-ethinyl estradiol (ORTHO-CYCLEN, 28,) 0.25-35 MG-MCG tablet Take 1 tablet by mouth daily. 28 tablet 2   [DISCONTINUED] omeprazole (PRILOSEC) 40 MG capsule Take 1 capsule (40 mg total) by mouth daily before breakfast. 30 capsule 2   No current facility-administered medications on file prior to visit.   Past Medical History:  Diagnosis Date   Asthma    GERD (gastroesophageal reflux disease)    Migraines    with aura   STD (sexually transmitted disease)    chlamydia 05-29-2019 treated   Allergies  Allergen Reactions   Penicillins     bumps   Social History   Socioeconomic History   Marital status: Single    Spouse name: Not on file   Number of children: Not on file   Years of education: Not on file   Highest education level: Not on file  Occupational History   Not on file  Tobacco Use   Smoking status: Every Day    Packs/day: 0.25    Years: 7.00    Pack years: 1.75    Types: Cigarettes   Smokeless  tobacco: Never  Vaping Use   Vaping Use: Never used  Substance and Sexual Activity   Alcohol use: No   Drug use: No   Sexual activity: Yes    Partners: Male    Birth control/protection: None  Other Topics Concern   Not on file  Social History Narrative   Not on file   Social Determinants of Health   Financial Resource Strain: Not on file  Food Insecurity: Not on file  Transportation Needs: Not on file  Physical Activity: Not on file  Stress: Not on file  Social Connections: Not on file   Vitals:   03/14/21 0913  BP: 128/70  Pulse: 60  Resp: 16   Wt Readings from Last 3 Encounters:  03/14/21 262 lb (118.8 kg)  01/20/21 265 lb (120.2 kg)  11/20/20 266 lb 1.6 oz (120.7 kg)   Body mass index is 41.04 kg/m.  Physical Exam  Nursing note and vitals reviewed. Constitutional: She is oriented to person, place, and time. She appears well-developed. No distress.  HENT:  Head: Normocephalic and atraumatic.  Mouth/Throat: Oropharynx is clear and moist and mucous membranes are normal. Area of molar extraction left upper side with minimal erythema and tender with palpitation.  Eyes: Pupils are equal, round, and reactive to light. Conjunctivae are normal.  Cardiovascular: Normal rate and regular rhythm.  No murmur heard. Pulses:      Dorsalis pedis pulses are 2+ on the right side and 2+ on the left side.  Respiratory: Effort normal and breath sounds normal. No respiratory distress.  GI: Soft. She exhibits no mass. There is no hepatomegaly. There is no abdominal tenderness.  Musculoskeletal:        General: No edema.  Lymphadenopathy:    She has no cervical adenopathy.  Neurological: She is alert and oriented to person, place, and time. She has normal strength. No cranial nerve deficit. Gait normal.  Skin: Skin is warm. No rash noted. No erythema.  Psychiatric: She has a normal mood and affect.  Well groomed, good eye contact.   ASSESSMENT AND PLAN:  Ms.Alanda was seen  today for palpitations and urinary problem.  Diagnoses and all orders for this visit:  Heart palpitations Cardiac monitor in 11/2019 with some mild abnormalities. Tobacco use seems to be a trigger factors,so she is planning on smoking cessation. Adequate hydration. Decrease caffeine intake. She prefers to hold on cardiologist f/u. Instructed about warning signs.  Hematuria, unspecified type Urine dipstick is not suggestive of UTI nor is clinical hx. Blood present in urine most likely from vaginal spotting. I do not think further work up is necessary at this time.  Vaginal spotting Pregnancy test today negative. Instructed about warning signs. Following with gyn.  Essential hypertension BP adequately controlled. Continue current management: Labetalol 100 mg bid and Amlodipine 5 mg daily. DASH/low salt diet to continue. Monitor BP at home. Eye exam recommended annually.  Tobacco use disorder She understands adverse effects of tobacco use and benefits of smoking cessation. Encouraged to quit smoking.  Hypokalemia Mild. K+ rich diet recommended for now.  Headache, unspecified headache type ? Tension headache. Other possible causes discussed. Hx and examination today do not suggest a serious process. Caution with NSAID's, try lower doses. Instructed about warning signs.  Morbid obesity (HCC) We discussed benefits of wt loss as well as adverse effects of obesity. Consistency with healthy diet and physical activity recommended. Weight Watchers is a good option as well as daily brisk walking for 15-30 min as tolerated.  I spent a total of 47 minutes in both face to face and non face to face activities for this visit on the date of this encounter. During this time history was obtained and documented, examination was performed, prior labs/imaging reviewed, and assessment/plan discussed. Follow with dentist as needed.   Return in about 3 months (around 06/14/2021).  Frances Murphy  G. [place], MD  [place] [facility]. [place] [facility].

## 2021-03-14 ENCOUNTER — Encounter: Payer: Self-pay | Admitting: Family Medicine

## 2021-03-14 ENCOUNTER — Other Ambulatory Visit: Payer: Self-pay

## 2021-03-14 ENCOUNTER — Ambulatory Visit (INDEPENDENT_AMBULATORY_CARE_PROVIDER_SITE_OTHER): Payer: 59 | Admitting: Family Medicine

## 2021-03-14 VITALS — BP 128/70 | HR 60 | Resp 16 | Ht 67.0 in | Wt 262.0 lb

## 2021-03-14 DIAGNOSIS — F17200 Nicotine dependence, unspecified, uncomplicated: Secondary | ICD-10-CM

## 2021-03-14 DIAGNOSIS — R319 Hematuria, unspecified: Secondary | ICD-10-CM

## 2021-03-14 DIAGNOSIS — E876 Hypokalemia: Secondary | ICD-10-CM

## 2021-03-14 DIAGNOSIS — R519 Headache, unspecified: Secondary | ICD-10-CM

## 2021-03-14 DIAGNOSIS — R002 Palpitations: Secondary | ICD-10-CM | POA: Diagnosis not present

## 2021-03-14 DIAGNOSIS — I10 Essential (primary) hypertension: Secondary | ICD-10-CM

## 2021-03-14 DIAGNOSIS — N939 Abnormal uterine and vaginal bleeding, unspecified: Secondary | ICD-10-CM | POA: Diagnosis not present

## 2021-03-14 LAB — POCT URINALYSIS DIPSTICK
Bilirubin, UA: NEGATIVE
Blood, UA: POSITIVE
Glucose, UA: NEGATIVE
Ketones, UA: NEGATIVE
Leukocytes, UA: NEGATIVE
Nitrite, UA: NEGATIVE
Protein, UA: NEGATIVE
Spec Grav, UA: 1.02 (ref 1.010–1.025)
Urobilinogen, UA: 0.2 E.U./dL
pH, UA: 6 (ref 5.0–8.0)

## 2021-03-14 LAB — POCT URINE PREGNANCY: Preg Test, Ur: NEGATIVE

## 2021-03-14 NOTE — Patient Instructions (Addendum)
A few things to remember from today's visit:  Hematuria, unspecified type - Plan: POCT urinalysis dipstick, CANCELED: Culture, Urine  Heart palpitations  Vaginal spotting  Essential hypertension  Tobacco use disorder  Hypokalemia  If you need refills please call your pharmacy. Do not use My Chart to request refills or for acute issues that need immediate attention.   Continue working on wt loss. Avoid caffeine and work on smoking cessation, these may help with your palpitations. Keep appt with your gyn. Let me know if you decide to go back to cardiologist. We can check labs next visit. Potassium rich diet.  Please be sure medication list is accurate. If a new problem present, please set up appointment sooner than planned today.

## 2021-03-26 ENCOUNTER — Other Ambulatory Visit: Payer: Self-pay | Admitting: Family Medicine

## 2021-03-26 ENCOUNTER — Encounter: Payer: Self-pay | Admitting: Family Medicine

## 2021-03-26 DIAGNOSIS — I10 Essential (primary) hypertension: Secondary | ICD-10-CM

## 2021-03-28 MED ORDER — LABETALOL HCL 100 MG PO TABS: 100.0000 mg | ORAL_TABLET | Freq: Two times a day (BID) | ORAL | 0 refills | Status: DC

## 2021-04-03 ENCOUNTER — Encounter: Payer: Self-pay | Admitting: Family Medicine

## 2021-04-08 ENCOUNTER — Other Ambulatory Visit: Payer: Self-pay | Admitting: Family Medicine

## 2021-04-08 MED ORDER — FOLIC ACID 800 MCG PO TABS: 800.0000 ug | ORAL_TABLET | Freq: Every day | ORAL | 0 refills | Status: DC

## 2021-04-08 NOTE — Progress Notes (Signed)
Rx for Folic acid 800 mcg to take qd sent to her pharmacy. Frances Wendel [place], MD

## 2021-05-15 ENCOUNTER — Other Ambulatory Visit: Payer: Self-pay | Admitting: Family Medicine

## 2021-05-15 DIAGNOSIS — I10 Essential (primary) hypertension: Secondary | ICD-10-CM

## 2021-06-25 ENCOUNTER — Encounter: Payer: Self-pay | Admitting: Family Medicine

## 2021-06-25 ENCOUNTER — Encounter: Payer: Self-pay | Admitting: Emergency Medicine

## 2021-06-25 ENCOUNTER — Ambulatory Visit
Admission: EM | Admit: 2021-06-25 | Discharge: 2021-06-25 | Disposition: A | Discharge status: Home / Self Care | Payer: 59 | Attending: Internal Medicine | Admitting: Internal Medicine

## 2021-06-25 ENCOUNTER — Other Ambulatory Visit: Payer: Self-pay

## 2021-06-25 DIAGNOSIS — G43709 Chronic migraine without aura, not intractable, without status migrainosus: Secondary | ICD-10-CM | POA: Diagnosis not present

## 2021-06-25 MED ORDER — ONDANSETRON HCL 4 MG/2ML IJ SOLN
4.0000 mg | Freq: Once | INTRAMUSCULAR | Status: AC
  Administered 2021-06-25: 4 mg via INTRAMUSCULAR

## 2021-06-25 MED ORDER — KETOROLAC TROMETHAMINE 30 MG/ML IJ SOLN
30.0000 mg | Freq: Once | INTRAMUSCULAR | Status: AC
  Administered 2021-06-25: 30 mg via INTRAMUSCULAR

## 2021-06-25 MED ORDER — DEXAMETHASONE SODIUM PHOSPHATE 10 MG/ML IJ SOLN
10.0000 mg | Freq: Once | INTRAMUSCULAR | Status: AC
  Administered 2021-06-25: 10 mg via INTRAMUSCULAR

## 2021-06-25 NOTE — Discharge Instructions (Signed)
You were given 3 medications to help alleviate your headache in urgent care today.  Please go the hospital if headache does not improve in the next 24 to 48 hours.

## 2021-06-25 NOTE — ED Triage Notes (Signed)
Saturday morning started having a headache. Hx of headaches when BP was uncontrolled, is now compliant with her BP meds. BP 110/73 in triage. Bilateral headache anteriorly from ear to ear. Describes as a nagging throb, constant, advil helps to relieve but doesn't clear it completely, 8/10, occasionally photosensitive, occasional spots in vision. Denies visual disturbances during triage.

## 2021-06-25 NOTE — ED Provider Notes (Signed)
[name]    CSN: [id number] Arrival date & time: 06/25/21  1003      History   Chief Complaint Chief Complaint  Patient presents with   Headache    HPI Frances Murphy is a 30 y.o. female.   Patient presents with headache that has been present for approximately 5 days.  Headache is rated 8/10 on pain scale, is constant, and is described as a "nagging headache".  Denies any recent head trauma.  Patient does have history of migraines.  Migraines were attributed to patient's high blood pressure and recently slowed down once patient was put on blood pressure medication.  Patient has been taking blood pressure medication as prescribed.  Patient has taken Advil with minimal improvement in headache.  Headache is present across the front of the head.  Denies nausea, vomiting, dizziness, blurred vision but does have some light sensitivity at times.   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 Authorizing Provider   albuterol (VENTOLIN HFA) 108 (90 Base) MCG/ACT inhaler Inhale 1-2 puffs into the lungs every 6 (six) hours as needed for wheezing or shortness of breath. 07/09/20   Wieters, Hallie C, PA-C  amLODipine (NORVASC) 5 MG tablet Take 1 tablet by mouth once daily 05/16/21   [place], Betty G, MD  clindamycin (CLEOCIN) 300 MG capsule Take 1 capsule (300 mg total) by mouth 3 (three) times daily. 02/28/21   Raspet, Noberto Retort, PA-C  folic acid (FOLVITE) 800 MCG tablet Take 1 tablet (800 mcg total) by mouth daily. 04/08/21   [place], Betty G, MD  labetalol (NORMODYNE) 100 MG tablet Take 1 tablet (100 mg total) by mouth 2 (two) times daily. 03/28/21   [place], Betty G, MD  Melatonin 10 MG TABS Take 1 tablet by mouth at bedtime as needed.    [provider]  fluticasone (FLONASE) 50 MCG/ACT nasal spray Place 2 sprays into both nostrils daily. 02/07/19 04/19/19  Belinda Fisher, PA-C  norgestimate-ethinyl estradiol (ORTHO-CYCLEN, 28,) 0.25-35 MG-MCG tablet Take 1 tablet by mouth daily. 07/25/20 10/19/20  [place], Betty G, MD  omeprazole (PRILOSEC) 40 MG capsule Take 1 capsule (40 mg total) by mouth daily before breakfast. 05/17/20 07/09/20  [place], Betty G,  MD    Family History Family History  Problem Relation Age of Onset   Hypertension Mother    Breast cancer Mother        in 23's   Hypertension Father    Hypertension Sister    Hypertension Maternal Grandmother    Cancer Paternal Grandmother    Cancer Paternal Grandfather     Social History Social History   Tobacco Use   Smoking status: Every Day    Packs/day: 0.25    Years: 7.00    Pack years: 1.75    Types: Cigarettes   Smokeless tobacco: Never  Vaping Use   Vaping Use: Never used  Substance Use Topics   Alcohol use: No   Drug use: No     Allergies   Penicillins   Review of Systems Review of Systems Per HPI  Physical Exam Triage Vital Signs ED Triage Vitals  Enc Vitals Group     BP 06/25/21 1148 110/73     Pulse Rate 06/25/21 1148 85      Resp 06/25/21 1148 16     Temp 06/25/21 1148 98.1 F (36.7 C)     Temp Source 06/25/21 1148 Oral     SpO2 06/25/21 1148 97 %     Weight --      Height --      Head Circumference --      Peak Flow --      Pain Score 06/25/21 1153 8     Pain Loc --      Pain Edu? --      Excl. in GC? --    No data found.  Updated Vital Signs BP 110/73 (BP Location: Right Arm)   Pulse 85   Temp 98.1 F (36.7 C) (Oral)   Resp 16   SpO2 97%   Visual Acuity Right Eye Distance:   Left Eye Distance:   Bilateral Distance:    Right Eye Near:   Left Eye Near:    Bilateral Near:     Physical Exam Constitutional:      General: She is not in acute distress.    Appearance: Normal appearance. She is not toxic-appearing or diaphoretic.  HENT:     Head: Normocephalic and atraumatic.     Right Ear: Tympanic membrane and ear canal normal.     Left Ear: Ear canal normal.     Nose: Nose normal.     Mouth/Throat:     Pharynx: No posterior oropharyngeal erythema.  Eyes:     Extraocular Movements: Extraocular movements intact.     Conjunctiva/sclera: Conjunctivae normal.     Pupils: Pupils are equal, round, and reactive to light.  Cardiovascular:     Rate and Rhythm: Normal rate and regular rhythm.     Pulses: Normal pulses.     Heart sounds: Normal heart sounds.  Pulmonary:     Effort: Pulmonary effort is normal. No respiratory distress.     Breath sounds: Normal breath sounds.  Skin:    General: Skin is warm and dry.  Neurological:     General: No focal deficit present.     Mental Status: She is alert and oriented to person, place, and time. Mental status is at baseline.     Cranial Nerves: Cranial nerves 2-12 are intact.     Sensory: Sensation is intact.     Motor: Motor function is intact.     Coordination: Coordination is intact.     Gait: Gait is intact.  Psychiatric:  Mood and Affect: Mood normal.        Behavior: Behavior normal.        Thought Content: Thought content  normal.        Judgment: Judgment normal.     UC Treatments / Results  Labs (all labs ordered are listed, but only abnormal results are displayed) Labs Reviewed - No data to display  EKG   Radiology No results found.  Procedures Procedures (including critical care time)  Medications Ordered in UC Medications  ketorolac (TORADOL) 30 MG/ML injection 30 mg (has no administration in time range)  dexamethasone (DECADRON) injection 10 mg (has no administration in time range)  ondansetron (ZOFRAN) injection 4 mg (has no administration in time range)    Initial Impression / Assessment and Plan / UC Course  I have reviewed the triage vital signs and the nursing notes.  Pertinent labs & imaging results that were available during my care of the patient were reviewed by me and considered in my medical decision making (see chart for details).     Physical exam and patient symptoms seem most consistent with patient's chronic migraines.  Migraine cocktail administered in urgent care today.  Neuro exam was normal so do not think patient is in immediate need of medical attention at the hospital at this time.  Advised patient to go to the hospital if no improvement in headache in the next 24 hours.  No red flags on exam.  Discussed strict return precautions.  Patient verbalized understanding and was agreeable with plan. Final Clinical Impressions(s) / UC Diagnoses   Final diagnoses:  None     Discharge Instructions      You were given 3 medications to help alleviate your headache in urgent care today.  Please go the hospital if headache does not improve in the next 24 to 48 hours.    ED Prescriptions   None    PDMP not reviewed this encounter.   Gustavus Bryant, [place] 06/25/21 1221

## 2021-06-30 NOTE — Progress Notes (Signed)
Chief Complaint  Patient presents with   Headache    Headaches have been going on for about a week, patient has tried Advil but didn't seem to help. Recently has been taking Excedrin migraine and has helped some but not fully. Smoking cigarettes seems to make it worse.   HPI: Frances Murphy is a 30 y.o. female, who is here today to follow on recent OV/ED visit. She was last seen on 03/14/21. Evaluated in the ER on 06/25/21 for headache, this is a chronic problem.  Fronto temporal headache, throbbing, 8-9/10, about 2-3 times week. Headache improves with medication but sometimes it is  back the next day, not as intense. She has not identified exacerbating factors. No N/V, a little photophobia. Negative for visual aura.  LMP 08/2021. She is not on birth control, still considering trying to get pregnant. Her gyn gave her medroxyprogesterone but she is afraid of side effects.  Chest heaviness after drinking milk, not able to burp. Upper abdominal bloating sensation. Negative for heartburn,changes in bowel habits, or abdominal pain. No associated CP,palpitations,or dyspnea.  + Smoker. Cig per day < 10 cig/day.  HTN: She is on Amlodipine 5 mg daily and Labetalol 100 mg bid. She is not checking BP at home.  Lab Results  Component Value Date   CREATININE 0.87 01/20/2021   BUN 8 01/20/2021   NA 138 01/20/2021   K 3.4 (L) 01/20/2021   CL 105 01/20/2021   CO2 24 01/20/2021   Review of Systems  Constitutional:  Negative for activity change, appetite change, fatigue and fever.  HENT:  Negative for mouth sores, nosebleeds and trouble swallowing.   Respiratory:  Negative for cough and wheezing.   Cardiovascular:  Negative for leg swelling.  Genitourinary:  Negative for decreased urine volume, dysuria, hematuria and pelvic pain.  Musculoskeletal:  Negative for gait problem and myalgias.  Skin:  Negative for pallor and rash.  Neurological:  Negative for syncope, facial asymmetry  and weakness.  Hematological:  Negative for adenopathy. Does not bruise/bleed easily.  Rest see pertinent positives and negatives per HPI.  Current Outpatient Medications on File Prior to Visit  Medication Sig Dispense Refill   albuterol (VENTOLIN HFA) 108 (90 Base) MCG/ACT inhaler Inhale 1-2 puffs into the lungs every 6 (six) hours as needed for wheezing or shortness of breath. 18 g 0   amLODipine (NORVASC) 5 MG tablet Take 1 tablet by mouth once daily 90 tablet 0   folic acid (FOLVITE) 800 MCG tablet Take 1 tablet (800 mcg total) by mouth daily. 90 tablet 0   Melatonin 10 MG TABS Take 1 tablet by mouth at bedtime as needed.     clindamycin (CLEOCIN) 300 MG capsule Take 1 capsule (300 mg total) by mouth 3 (three) times daily. (Patient not taking: Reported on 07/01/2021) 21 capsule 0   [DISCONTINUED] fluticasone (FLONASE) 50 MCG/ACT nasal spray Place 2 sprays into both nostrils daily. 1 g 0   [DISCONTINUED] norgestimate-ethinyl estradiol (ORTHO-CYCLEN, 28,) 0.25-35 MG-MCG tablet Take 1 tablet by mouth daily. 28 tablet 2   [DISCONTINUED] omeprazole (PRILOSEC) 40 MG capsule Take 1 capsule (40 mg total) by mouth daily before breakfast. 30 capsule 2   No current facility-administered medications on file prior to visit.   Past Medical History:  Diagnosis Date   Asthma    GERD (gastroesophageal reflux disease)    Migraines    with aura   STD (sexually transmitted disease)    chlamydia 05-29-2019 treated   Allergies  Allergen  Reactions   Penicillins     bumps    Social History   Socioeconomic History   Marital status: Single    Spouse name: Not on file   Number of children: Not on file   Years of education: Not on file   Highest education level: GED or equivalent  Occupational History   Not on file  Tobacco Use   Smoking status: Every Day    Packs/day: 0.25    Years: 7.00    Pack years: 1.75    Types: Cigarettes   Smokeless tobacco: Never  Vaping Use   Vaping Use: Never  used  Substance and Sexual Activity   Alcohol use: No   Drug use: No   Sexual activity: Yes    Partners: Male    Birth control/protection: None  Other Topics Concern   Not on file  Social History Narrative   Not on file   Social Determinants of Health   Financial Resource Strain: Low Risk    Difficulty of Paying Living Expenses: Not very hard  Food Insecurity: No Food Insecurity   Worried About Programme researcher, broadcasting/film/video in the Last Year: Never true   Ran Out of Food in the Last Year: Never true  Transportation Needs: No Transportation Needs   Lack of Transportation (Medical): No   Lack of Transportation (Non-Medical): No  Physical Activity: Sufficiently Active   Days of Exercise per Week: 5 days   Minutes of Exercise per Session: 30 min  Stress: Stress Concern Present   Feeling of Stress : Rather much  Social Connections: Socially Isolated   Frequency of Communication with Friends and Family: Once a week   Frequency of Social Gatherings with Friends and Family: Once a week   Attends Religious Services: 1 to 4 times per year   Active Member of [employer] or Organizations: No   Attends Banker Meetings: Not on file   Marital Status: Never married   Vitals:   07/01/21 1458  BP: 122/72  Pulse: 68  Resp: 16  Temp: 98.4 F (36.9 C)  SpO2: 99%   Body mass index is 39.84 kg/m.  Physical Exam Vitals and nursing note reviewed.  Constitutional:      General: She is not in acute distress.    Appearance: She is well-developed.  HENT:     Head: Normocephalic and atraumatic.     Mouth/Throat:     Mouth: Mucous membranes are moist.     Pharynx: Oropharynx is clear.  Eyes:     Conjunctiva/sclera: Conjunctivae normal.  Cardiovascular:     Rate and Rhythm: Normal rate and regular rhythm.     Pulses:          Dorsalis pedis pulses are 2+ on the right side and 2+ on the left side.     Heart sounds: No murmur heard. Pulmonary:     Effort: Pulmonary effort is normal. No  respiratory distress.     Breath sounds: Normal breath sounds.  Abdominal:     Palpations: Abdomen is soft. There is no hepatomegaly or mass.     Tenderness: There is no abdominal tenderness.  Lymphadenopathy:     Cervical: No cervical adenopathy.  Skin:    General: Skin is warm.     Findings: No erythema or rash.  Neurological:     General: No focal deficit present.     Mental Status: She is alert and oriented to person, place, and time.     Cranial Nerves:  No cranial nerve deficit.     Gait: Gait normal.  Psychiatric:     Comments: Well groomed, good eye contact.   ASSESSMENT AND PLAN:  Frances Murphy was seen today for headache.  Diagnoses and all orders for this visit:  Headache, unspecified headache type Hx and examination do not suggest a serious process. I do not think imaging is needed at this time. Prophylactic treatment is indicated, Topamax or Amitriptyline. Because she is still considering getting pregnant, neither one recommended. Continue Excedrin migraine daily prn. Clearly instructed about warning signs.  Essential hypertension BP adequately controlled. Continue Labetalol and Amlodipine same dose. Low salt diet. K+ rich diet. Monitor BP regularly.  -     labetalol (NORMODYNE) 100 MG tablet; Take 1 tablet (100 mg total) by mouth 2 (two) times daily.  Abdominal bloating Gas X or Benno may help. Avoid foods that could aggravate problem like milk. Increase water intake and continue adequate fiber intake.  Tobacco use disorder We discussed adverse effects of tobacco use and benefits of smoking cessation. She would like to try Nicotine patches.  -     nicotine (NICODERM CQ - DOSED IN MG/24 HOURS) 14 mg/24hr patch; Place 1 patch (14 mg total) onto the skin daily.  Amenorrhea, secondary We discussed some side effects of Medroxyprogesterone as well as risks for endometrial hyperplasia. Instructed to follow gyn's recommendations. Continue following with  gyn.  Return in about 6 months (around 12/29/2021).  Lowanda Cashaw G. [place], MD  [facility]. [place] office.

## 2021-07-01 ENCOUNTER — Ambulatory Visit (INDEPENDENT_AMBULATORY_CARE_PROVIDER_SITE_OTHER): Payer: 59 | Admitting: Family Medicine

## 2021-07-01 VITALS — BP 122/72 | HR 68 | Temp 98.4°F | Resp 16 | Ht 69.0 in | Wt 269.8 lb

## 2021-07-01 DIAGNOSIS — R519 Headache, unspecified: Secondary | ICD-10-CM | POA: Diagnosis not present

## 2021-07-01 DIAGNOSIS — R140 Abdominal distension (gaseous): Secondary | ICD-10-CM | POA: Diagnosis not present

## 2021-07-01 DIAGNOSIS — F17200 Nicotine dependence, unspecified, uncomplicated: Secondary | ICD-10-CM | POA: Diagnosis not present

## 2021-07-01 DIAGNOSIS — I10 Essential (primary) hypertension: Secondary | ICD-10-CM | POA: Diagnosis not present

## 2021-07-01 DIAGNOSIS — N911 Secondary amenorrhea: Secondary | ICD-10-CM

## 2021-07-01 MED ORDER — NICOTINE 14 MG/24HR TD PT24: 14.0000 mg | MEDICATED_PATCH | Freq: Every day | TRANSDERMAL | 0 refills | Status: DC

## 2021-07-01 MED ORDER — LABETALOL HCL 100 MG PO TABS: 100.0000 mg | ORAL_TABLET | Freq: Two times a day (BID) | ORAL | 1 refills | Status: DC

## 2021-07-01 NOTE — Patient Instructions (Addendum)
A few things to remember from today's visit:  Headache, unspecified headache type  Essential hypertension - Plan: labetalol (NORMODYNE) 100 MG tablet  Abdominal bloating  If you need refills please call your pharmacy. Do not use My Chart to request refills or for acute issues that need immediate attention.  Continue Excedrin migraine. Monitor for new symptoms. Gas X or Benno may help with gas pain.  Nicotine to help with smoking cessation.  Please be sure medication list is accurate. If a new problem present, please set up appointment sooner than planned today.

## 2021-08-05 ENCOUNTER — Other Ambulatory Visit: Payer: Self-pay | Admitting: Family Medicine

## 2021-08-05 DIAGNOSIS — I10 Essential (primary) hypertension: Secondary | ICD-10-CM

## 2021-08-05 MED ORDER — AMLODIPINE BESYLATE 5 MG PO TABS: 5.0000 mg | ORAL_TABLET | Freq: Every day | ORAL | 2 refills | Status: DC

## 2021-10-01 ENCOUNTER — Ambulatory Visit: Payer: Managed Care, Other (non HMO) | Admitting: Family Medicine

## 2021-10-01 ENCOUNTER — Encounter: Payer: Self-pay | Admitting: Family Medicine

## 2021-10-01 VITALS — BP 130/80 | Resp 16 | Ht 69.0 in | Wt 273.2 lb

## 2021-10-01 DIAGNOSIS — G4489 Other headache syndrome: Secondary | ICD-10-CM | POA: Diagnosis not present

## 2021-10-01 DIAGNOSIS — K047 Periapical abscess without sinus: Secondary | ICD-10-CM

## 2021-10-01 DIAGNOSIS — N911 Secondary amenorrhea: Secondary | ICD-10-CM

## 2021-10-01 DIAGNOSIS — R519 Headache, unspecified: Secondary | ICD-10-CM

## 2021-10-01 LAB — POCT URINE PREGNANCY: Preg Test, Ur: NEGATIVE

## 2021-10-01 MED ORDER — CLINDAMYCIN HCL 300 MG PO CAPS: 300.0000 mg | ORAL_CAPSULE | Freq: Three times a day (TID) | ORAL | 0 refills | Status: AC

## 2021-10-01 NOTE — Patient Instructions (Addendum)
A few things to remember from today's visit: ? ?Dental abscess - Plan: clindamycin (CLEOCIN) 300 MG capsule ? ?Headache, unspecified headache type - Plan: CT HEAD WO CONTRAST ( ) ? ?Amenorrhea, secondary ? ?Other headache syndrome - Plan: CT HEAD WO CONTRAST ( ) ? ?If you need refills please call your pharmacy. ?Do not use My Chart to request refills or for acute issues that need immediate attention. ?  ?Head CT will be arranged. ?We can consider treatment once you decide about pregnancy or we can arrange appt with neurologist. ? ?You need to arrange appt with dentists. ? ?Please be sure medication list is accurate. ?If a new problem present, please set up appointment sooner than planned today. ? ?Dental Abscess ?A dental abscess is an area of pus in or around a tooth. It comes from an infection. It can cause pain and other symptoms. Treatment will help with symptoms and prevent the infection from spreading. ?What are the causes? ?This condition is caused by an infection in or around the tooth. This can be from: ?Very bad tooth decay (cavities). ?A bad injury to the tooth, such as a broken or chipped tooth. ?What increases the risk? ?The risk to get an abscess is higher in males. It is also more likely in people who: ?Have dental decay. ?Have very bad gum disease. ?Eat sugary snacks between meals. ?Use tobacco. ?Have diabetes. ?Have a weak disease-fighting system (immune system). ?Do not brush their teeth regularly. ?What are the signs or symptoms? ?Some mild symptoms are: ?Tenderness. ?Bad breath. ?Fever. ?A sharp, sour taste in the mouth. ?Pain in and around the infected tooth. ?Worse symptoms of this condition include: ?Swollen neck glands. ?Chills. ?Pus draining around the tooth. ?Swelling and redness around the tooth, the mouth, or the face. ?Very bad pain in and around the tooth. ?The worst symptoms can include: ?Difficulty swallowing. ?Difficulty opening your mouth. ?Feeling like you may vomit or  vomiting. ?How is this treated? ?This is treated by getting rid of the infection. Your dentist will discuss ways to do this, including: ?Antibiotic medicines. ?Antibacterial mouth rinse. ?An incision in the abscess to drain out the pus. ?A root canal. ?Removing the tooth. ?Follow these instructions at home: ?Medicines ?Take over-the-counter and prescription medicines only as told by your dentist. ?If you were prescribed an antibiotic medicine, take it as told by your dentist. Do not stop taking it even if you start to feel better. ?If you were prescribed a gel that has numbing medicine in it, use it exactly as told. ?Ask your dentist if you should avoid driving or using machines while you are taking your medicine. ?General instructions ?Rinse your mouth often with salt water. To make salt water, dissolve ?-1 tsp (3-6 g) of salt in 1 cup (237 mL) of warm water. ?Eat a soft diet while your mouth is healing. ?Drink enough fluid to keep your pee (urine) pale yellow. ?Do not apply heat to the outside of your mouth. ?Do not smoke or use any products that contain nicotine or tobacco. If you need help quitting, ask your dentist. ?Keep all follow-up visits. ?Prevent an abscess ?Brush your teeth every morning and every night. Use fluoride toothpaste. ?Floss your teeth each day. ?Get dental cleanings as often as told by your dentist. ?Think about getting dental sealant put on teeth that have deep holes (decay). ?Drink water that has fluoride in it. ?Most tap water has fluoride. ?Check the label on bottled water to see if it has fluoride in it. ?  Drink water instead of sugary drinks. ?Eat healthy meals and snacks. ?Wear a mouth guard or face shield when you play sports. ?Contact a doctor if: ?Your pain is worse and medicine does not help. ?Get help right away if: ?You have a fever or chills. ?Your symptoms suddenly get worse. ?You have a very bad headache. ?You have problems breathing or swallowing. ?You have trouble opening  your mouth. ?You have swelling in your neck or close to your eye. ?These symptoms may be an emergency. Get help right away. Call your local emergency services (911 in the [place] ?Do not wait to see if the symptoms will go away. ?Do not drive yourself to the hospital. ?Summary ?A dental abscess is an area of pus in or around a tooth. It is caused by an infection. ?Treatment will help with symptoms and prevent the infection from spreading. ?Take over-the-counter and prescription medicines only as told by your dentist. ?To prevent an abscess, take good care of your teeth. Brush your teeth every morning and night. Use floss every day. ?Get dental cleanings as often as told by your dentist. ?This information is not intended to replace advice given to you by your health care provider. Make sure you discuss any questions you have with your health care provider. ?Document Revised: 09/26/2020 Document Reviewed: 09/26/2020 ?Elsevier Patient Education ? 2022 Elsevier Inc. ? ? ? ? ? ? ?

## 2021-10-01 NOTE — Progress Notes (Signed)
? ?ACUTE VISIT ?Chief Complaint  ?Patient presents with  ? sharp pains in head  ?  Happened last night, on the right side, sharp pains.   ? ?HPI: ?Ms.Rogene Meth is a 31 y.o. female with hx of HTN,obesity,tobacco use, and GERD here today complaining of sharp pain right temporal and around ear, which is different to her normal chronic headaches. ?Problem has been intermittent,sudden onset,and severe, 10/10. ?Negative for associated fever,sore throat,changes in hearing ,or ear discharge. ?No recent URI or travel. ? ?She has had some dental problems. Right lower molar that needs to be removed. This is chronic, she avoids chewing on that side. She already had one tooth extraction and took her a few weeks to heal, so she has not arranged follow up appt with her dentist. ? ?She is concerned about her chronic headaches. ?She was last seen on 07/01/21. ?Evaluated in the ED on 06/25/21 because headaches. ?Bitemporal throbbing like headache, 7-8/10,2-3 times per week. ?No associated nausea or vomiting, sometimes photophobia. ?No visual aura or preceding symptoms. ?Headache can last 24-48 hours. ?Temporal relief with OTC analgesics. ? ?LMP 08/2020, following with gyn. She just started Medroxyprogesterone. ?Still considering to get pregnant but would like to lose wt and quit smoking before she does so, she is not using birth control. ? ?Review of Systems  ?Constitutional:  Negative for activity change, appetite change, chills and unexpected weight change.  ?HENT:  Positive for dental problem. Negative for mouth sores.   ?Respiratory:  Negative for cough, shortness of breath and wheezing.   ?Cardiovascular:  Negative for chest pain and leg swelling.  ?Musculoskeletal:  Negative for gait problem and myalgias.  ?Skin:  Negative for rash.  ?Neurological:  Negative for syncope, facial asymmetry and weakness.  ?Psychiatric/Behavioral:  Negative for confusion. The patient is nervous/anxious.   ?Rest see pertinent positives and  negatives per HPI. ? ?Current Outpatient Medications on File Prior to Visit  ?Medication Sig Dispense Refill  ? albuterol (VENTOLIN HFA) 108 (90 Base) MCG/ACT inhaler Inhale 1-2 puffs into the lungs every 6 (six) hours as needed for wheezing or shortness of breath. 18 g 0  ? amLODipine (NORVASC) 5 MG tablet Take 1 tablet (5 mg total) by mouth daily. 90 tablet 2  ? folic acid (FOLVITE) 800 MCG tablet Take 1 tablet (800 mcg total) by mouth daily. 90 tablet 0  ? labetalol (NORMODYNE) 100 MG tablet Take 1 tablet (100 mg total) by mouth 2 (two) times daily. 180 tablet 1  ? Melatonin 10 MG TABS Take 1 tablet by mouth at bedtime as needed.    ? nicotine (NICODERM CQ - DOSED IN MG/24 HOURS) 14 mg/24hr patch Place 1 patch (14 mg total) onto the skin daily. 28 patch 0  ? [DISCONTINUED] fluticasone (FLONASE) 50 MCG/ACT nasal spray Place 2 sprays into both nostrils daily. 1 g 0  ? [DISCONTINUED] norgestimate-ethinyl estradiol (ORTHO-CYCLEN, 28,) 0.25-35 MG-MCG tablet Take 1 tablet by mouth daily. 28 tablet 2  ? [DISCONTINUED] omeprazole (PRILOSEC) 40 MG capsule Take 1 capsule (40 mg total) by mouth daily before breakfast. 30 capsule 2  ? ?No current facility-administered medications on file prior to visit.  ? ?Past Medical History:  ?Diagnosis Date  ? Asthma   ? GERD (gastroesophageal reflux disease)   ? Migraines   ? with aura  ? STD (sexually transmitted disease)   ? chlamydia 05-29-2019 treated  ? ?Allergies  ?Allergen Reactions  ? Penicillins   ?  bumps  ? ? ?Social History  ? ?  Socioeconomic History  ? Marital status: Single  ?  Spouse name: Not on file  ? Number of children: Not on file  ? Years of education: Not on file  ? Highest education level: GED or equivalent  ?Occupational History  ? Not on file  ?Tobacco Use  ? Smoking status: Every Day  ?  Packs/day: 0.25  ?  Years: 7.00  ?  Pack years: 1.75  ?  Types: Cigarettes  ? Smokeless tobacco: Never  ?Vaping Use  ? Vaping Use: Never used  ?Substance and Sexual Activity  ?  Alcohol use: No  ? Drug use: No  ? Sexual activity: Yes  ?  Partners: Male  ?  Birth control/protection: None  ?Other Topics Concern  ? Not on file  ?Social History Narrative  ? Not on file  ? ?Social Determinants of Health  ? ?Financial Resource Strain: Low Risk   ? Difficulty of Paying Living Expenses: Not very hard  ?Food Insecurity: No Food Insecurity  ? Worried About Programme researcher, broadcasting/film/video in the Last Year: Never true  ? Ran Out of Food in the Last Year: Never true  ?Transportation Needs: No Transportation Needs  ? Lack of Transportation (Medical): No  ? Lack of Transportation (Non-Medical): No  ?Physical Activity: Sufficiently Active  ? Days of Exercise per Week: 5 days  ? Minutes of Exercise per Session: 30 min  ?Stress: Stress Concern Present  ? Feeling of Stress : Rather much  ?Social Connections: Socially Isolated  ? Frequency of Communication with Friends and Family: Once a week  ? Frequency of Social Gatherings with Friends and Family: Once a week  ? Attends Religious Services: 1 to 4 times per year  ? Active Member of Clubs or Organizations: No  ? Attends Banker Meetings: Not on file  ? Marital Status: Never married  ? ?Vitals:  ? 10/01/21 1046  ?BP: 130/80  ?Resp: 16  ? ?Body mass index is 40.35 kg/m?. ? ?Physical Exam ?Vitals and nursing note reviewed.  ?Constitutional:   ?   General: She is not in acute distress. ?   Appearance: She is well-developed.  ?HENT:  ?   Head: Normocephalic and atraumatic.  ?   Mouth/Throat:  ?   Mouth: Mucous membranes are moist.  ?   Dentition: Dental tenderness and gingival swelling present.  ?   Pharynx: Oropharynx is clear.  ? ?Eyes:  ?   Conjunctiva/sclera: Conjunctivae normal.  ?   Funduscopic exam: ?   Right eye: No hemorrhage. Red reflex present.     ?   Left eye: No hemorrhage. Red reflex present. ?Cardiovascular:  ?   Rate and Rhythm: Normal rate and regular rhythm.  ?   Heart sounds: No murmur heard. ?Pulmonary:  ?   Effort: Pulmonary effort is  normal. No respiratory distress.  ?   Breath sounds: Normal breath sounds.  ?Abdominal:  ?   Palpations: Abdomen is soft. There is no hepatomegaly or mass.  ?   Tenderness: There is no abdominal tenderness.  ?Lymphadenopathy:  ?   Cervical: No cervical adenopathy.  ?Skin: ?   General: Skin is warm.  ?   Findings: No erythema or rash.  ?Neurological:  ?   General: No focal deficit present.  ?   Mental Status: She is alert and oriented to person, place, and time.  ?   Cranial Nerves: No cranial nerve deficit.  ?   Gait: Gait normal.  ?   Deep  Tendon Reflexes:  ?   Reflex Scores: ?     Bicep reflexes are 2+ on the right side and 2+ on the left side. ?     Patellar reflexes are 2+ on the right side and 2+ on the left side. ?Psychiatric:  ?   Comments: Well groomed, good eye contact.  ? ?ASSESSMENT AND PLAN: ? ?Ms. Kinaya was seen today for sharp pains in head. ? ?Diagnoses and all orders for this visit: ?Orders Placed This Encounter  ?Procedures  ? CT HEAD WO CONTRAST ( )  ? POCT urine pregnancy  ? ?Dental abscess ?We discussed possible complications. ?Strongly recommend arranging appt with her dentist. ?Will treat with Clindamycin, some side effects discussed. ? ?-     clindamycin (CLEOCIN) 300 MG capsule; Take 1 capsule (300 mg total) by mouth 3 (three) times daily for 7 days. ? ?Headache, unspecified headache type ?Chronic. ?Migraine vs tension headache. We have not tried prophylactic treatment because she is still considering pregnancy. She understands if we decide to try Topamax or TCA, she needs to avoid pregnancy,specially with the former one. ?She is concerned about a serious process and would like neuro referral or head imaging done. ?Head CT will be arranged. ?Instructed about warning signs. ? ?Amenorrhea, secondary ?   Pregnancy done today negative. ?Continue Medroxyprogesterone. ?Following with gyn. ? ?Return if symptoms worsen or fail to improve, for Keep next f/u appt.. ? ? ?Kham Zuckerman G. [place],  MD ? ?[facility]. ?[place] office. ? ? ?

## 2021-10-06 ENCOUNTER — Ambulatory Visit: Payer: Managed Care, Other (non HMO) | Admitting: Family Medicine

## 2021-10-10 ENCOUNTER — Other Ambulatory Visit: Payer: Self-pay

## 2021-10-10 ENCOUNTER — Ambulatory Visit (INDEPENDENT_AMBULATORY_CARE_PROVIDER_SITE_OTHER)
Admission: RE | Admit: 2021-10-10 | Discharge: 2021-10-10 | Disposition: A | Discharge status: Home / Self Care | Payer: Managed Care, Other (non HMO) | Source: Ambulatory Visit | Attending: Family Medicine | Admitting: Family Medicine

## 2021-10-10 DIAGNOSIS — G4489 Other headache syndrome: Secondary | ICD-10-CM

## 2021-10-10 DIAGNOSIS — R519 Headache, unspecified: Secondary | ICD-10-CM

## 2022-01-09 ENCOUNTER — Other Ambulatory Visit: Payer: Self-pay | Admitting: Family Medicine

## 2022-01-09 DIAGNOSIS — I10 Essential (primary) hypertension: Secondary | ICD-10-CM

## 2022-03-17 ENCOUNTER — Encounter: Payer: Self-pay | Admitting: Family Medicine

## 2022-03-17 ENCOUNTER — Ambulatory Visit: Payer: Managed Care, Other (non HMO)

## 2022-03-17 ENCOUNTER — Ambulatory Visit
Admission: RE | Admit: 2022-03-17 | Discharge: 2022-03-17 | Disposition: A | Discharge status: Home / Self Care | Payer: Managed Care, Other (non HMO) | Source: Ambulatory Visit | Attending: Physician Assistant | Admitting: Physician Assistant

## 2022-03-17 ENCOUNTER — Other Ambulatory Visit: Payer: Self-pay

## 2022-03-17 VITALS — BP 134/85 | HR 87 | Temp 98.7°F | Resp 18

## 2022-03-17 DIAGNOSIS — K047 Periapical abscess without sinus: Secondary | ICD-10-CM

## 2022-03-17 MED ORDER — CLINDAMYCIN HCL 300 MG PO CAPS: 300.0000 mg | ORAL_CAPSULE | Freq: Three times a day (TID) | ORAL | 0 refills | Status: AC

## 2022-03-17 NOTE — ED Provider Notes (Signed)
[name]    CSN: [id number] Arrival date & time: 03/17/22  1443      History   Chief Complaint Chief Complaint  Patient presents with   Dental Problem    Entered by patient    HPI Frances Murphy is a 31 y.o. female.   Patient here today for evaluation of possible dental abscess to her right lower molar.  She reports that she has had issues over the last several weeks with this area however the last few days has had more significant pain.  She has been using ibuprofen with mild relief.  She denies fever.  She is currently trying to set up appointment with dentistry  The history is provided by the patient.    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 Authorizing Provider  clindamycin (CLEOCIN) 300 MG capsule Take 1 capsule (300 mg total) by mouth 3 (three) times daily for 7 days. 03/17/22 03/24/22 Yes Tomi Bamberger, PA-C  albuterol (VENTOLIN HFA) 108 (90 Base) MCG/ACT inhaler Inhale 1-2 puffs into the lungs every 6  (six) hours as needed for wheezing or shortness of breath. 07/09/20   Wieters, Hallie C, PA-C  amLODipine (NORVASC) 5 MG tablet Take 1 tablet (5 mg total) by mouth daily. 08/05/21   [place], Betty G, MD  folic acid (FOLVITE) 800 MCG tablet Take 1 tablet (800 mcg total) by mouth daily. 04/08/21   [place], Betty G, MD  labetalol (NORMODYNE) 100 MG tablet Take 1 tablet by mouth twice daily 01/09/22   [place], Betty G, MD  Melatonin 10 MG TABS Take 1 tablet by mouth at bedtime as needed.    [provider]  nicotine (NICODERM CQ - DOSED IN MG/24 HOURS) 14 mg/24hr patch Place 1 patch (14 mg total) onto the skin daily. 07/01/21   [place], Betty G, MD  fluticasone (FLONASE) 50 MCG/ACT nasal spray Place 2 sprays into both nostrils daily. 02/07/19 04/19/19  Belinda Fisher, PA-C  norgestimate-ethinyl estradiol (ORTHO-CYCLEN, 28,) 0.25-35 MG-MCG tablet Take 1 tablet by mouth daily. 07/25/20 10/19/20  [place], Betty G, MD  omeprazole (PRILOSEC) 40 MG capsule Take 1 capsule (40 mg total) by mouth daily before breakfast. 05/17/20 07/09/20  [place], Betty G, MD  Family History Family History  Problem Relation Age of Onset   Hypertension Mother    Breast cancer Mother        in 2's   Hypertension Father    Hypertension Sister    Hypertension Maternal Grandmother    Cancer Paternal Grandmother    Cancer Paternal Grandfather     Social History Social History   Tobacco Use   Smoking status: Every Day    Packs/day: 0.25    Years: 7.00    Total pack years: 1.75    Types: Cigarettes   Smokeless tobacco: Never  Vaping Use   Vaping Use: Never used  Substance Use Topics   Alcohol use: No   Drug use: No     Allergies   Penicillins   Review of Systems Review of Systems  Constitutional:  Negative for chills and fever.  HENT:  Positive for dental problem and ear pain.   Eyes:  Negative for discharge and redness.  Respiratory:  Negative for shortness of breath.   Gastrointestinal:  Negative for abdominal  pain, nausea and vomiting.     Physical Exam Triage Vital Signs ED Triage Vitals  Enc Vitals Group     BP      Pulse      Resp      Temp      Temp src      SpO2      Weight      Height      Head Circumference      Peak Flow      Pain Score      Pain Loc      Pain Edu?      Excl. in GC?    No data found.  Updated Vital Signs BP 134/85 (BP Location: Left Arm)   Pulse 87   Temp 98.7 F (37.1 C) (Oral)   Resp 18   SpO2 98%      Physical Exam Vitals and nursing note reviewed.  Constitutional:      General: She is not in acute distress.    Appearance: Normal appearance. She is not ill-appearing.  HENT:     Head: Normocephalic and atraumatic.     Mouth/Throat:     Comments: Significant gum swelling and erythema to right posterior lower molar Eyes:     Conjunctiva/sclera: Conjunctivae normal.  Cardiovascular:     Rate and Rhythm: Normal rate.  Pulmonary:     Effort: Pulmonary effort is normal.  Neurological:     Mental Status: She is alert.  Psychiatric:        Mood and Affect: Mood normal.        Behavior: Behavior normal.        Thought Content: Thought content normal.      UC Treatments / Results  Labs (all labs ordered are listed, but only abnormal results are displayed) Labs Reviewed - No data to display  EKG   Radiology No results found.  Procedures Procedures (including critical care time)  Medications Ordered in UC Medications - No data to display  Initial Impression / Assessment and Plan / UC Course  I have reviewed the triage vital signs and the nursing notes.  Pertinent labs & imaging results that were available during my care of the patient were reviewed by me and considered in my medical decision making (see chart for details).    Antibiotic prescribed to cover dental abscess.  Recommended follow-up with dentistry as planned.  Encouraged follow-up sooner  with any further concerns.   Final Clinical Impressions(s) / UC Diagnoses    Final diagnoses:  Dental abscess   Discharge Instructions   None    ED Prescriptions     Medication Sig Dispense Auth. Provider   clindamycin (CLEOCIN) 300 MG capsule Take 1 capsule (300 mg total) by mouth 3 (three) times daily for 7 days. 21 capsule Tomi Bamberger, PA-C      PDMP not reviewed this encounter.   Tomi Bamberger, PA-C 03/17/22 1554

## 2022-03-17 NOTE — ED Triage Notes (Signed)
Pt here for right lower dental pain x several weeks worse over last few days with pain into ear

## 2022-04-09 ENCOUNTER — Other Ambulatory Visit: Payer: Self-pay | Admitting: Family Medicine

## 2022-04-09 DIAGNOSIS — I10 Essential (primary) hypertension: Secondary | ICD-10-CM

## 2022-05-01 ENCOUNTER — Telehealth: Payer: Self-pay | Admitting: Family Medicine

## 2022-05-01 DIAGNOSIS — I10 Essential (primary) hypertension: Secondary | ICD-10-CM

## 2022-05-01 MED ORDER — LABETALOL HCL 100 MG PO TABS: 100.0000 mg | ORAL_TABLET | Freq: Two times a day (BID) | ORAL | 0 refills | Status: DC

## 2022-05-01 NOTE — Telephone Encounter (Signed)
Requesting refill taking last tablet today   labetalol (NORMODYNE) 100 MG tablet

## 2022-05-01 NOTE — Telephone Encounter (Signed)
Rx was sent in 04/10/22, but re-sent Rx today, 05/01/22.

## 2022-05-11 ENCOUNTER — Telehealth: Payer: Self-pay | Admitting: Family Medicine

## 2022-05-11 DIAGNOSIS — I10 Essential (primary) hypertension: Secondary | ICD-10-CM

## 2022-05-11 MED ORDER — AMLODIPINE BESYLATE 5 MG PO TABS: 5.0000 mg | ORAL_TABLET | Freq: Every day | ORAL | 2 refills | Status: DC

## 2022-05-11 NOTE — Telephone Encounter (Signed)
Pt is calling and need a refill on amLODipine (NORVASC) 5 MG tablet  [place], [place] Phone:  [phone]  Fax:  [phone]

## 2022-07-15 LAB — HM PAP SMEAR: HM Pap smear: NORMAL

## 2022-07-23 ENCOUNTER — Ambulatory Visit (INDEPENDENT_AMBULATORY_CARE_PROVIDER_SITE_OTHER): Payer: Managed Care, Other (non HMO) | Admitting: Family

## 2022-07-23 ENCOUNTER — Encounter: Payer: Self-pay | Admitting: Family Medicine

## 2022-07-23 ENCOUNTER — Ambulatory Visit: Payer: Managed Care, Other (non HMO)

## 2022-07-23 ENCOUNTER — Ambulatory Visit: Admit: 2022-07-23 | Payer: Managed Care, Other (non HMO)

## 2022-07-23 VITALS — BP 118/68 | HR 78 | Temp 98.4°F | Wt 279.0 lb

## 2022-07-23 DIAGNOSIS — M25571 Pain in right ankle and joints of right foot: Secondary | ICD-10-CM

## 2022-07-23 DIAGNOSIS — B351 Tinea unguium: Secondary | ICD-10-CM | POA: Diagnosis not present

## 2022-07-23 DIAGNOSIS — Z79899 Other long term (current) drug therapy: Secondary | ICD-10-CM

## 2022-07-23 MED ORDER — TRAMADOL HCL 50 MG PO TABS: 50.0000 mg | ORAL_TABLET | Freq: Three times a day (TID) | ORAL | 0 refills | Status: AC | PRN

## 2022-07-23 MED ORDER — TERBINAFINE HCL 250 MG PO TABS: 250.0000 mg | ORAL_TABLET | Freq: Every day | ORAL | 0 refills | Status: DC

## 2022-07-24 ENCOUNTER — Ambulatory Visit (HOSPITAL_COMMUNITY): Payer: Managed Care, Other (non HMO)

## 2022-07-24 LAB — COMPREHENSIVE METABOLIC PANEL
ALT: 15 U/L (ref 0–35)
AST: 19 U/L (ref 0–37)
Albumin: 4.1 g/dL (ref 3.5–5.2)
Alkaline Phosphatase: 39 U/L (ref 39–117)
BUN: 9 mg/dL (ref 6–23)
CO2: 28 mEq/L (ref 19–32)
Calcium: 9.5 mg/dL (ref 8.4–10.5)
Chloride: 106 mEq/L (ref 96–112)
Creatinine, Ser: 0.82 mg/dL (ref 0.40–1.20)
GFR: 94.7 mL/min (ref >60.00)
Glucose, Bld: 76 mg/dL (ref 70–99)
Potassium: 4.1 mEq/L (ref 3.5–5.1)
Sodium: 140 mEq/L (ref 135–145)
Total Bilirubin: 0.5 mg/dL (ref 0.2–1.2)
Total Protein: 7.5 g/dL (ref 6.0–8.3)

## 2022-07-27 NOTE — Progress Notes (Signed)
Acute Office Visit  Subjective:     Patient ID: Frances Murphy, female    DOB: 1990/12/30, 31 y.o.   MRN: [id number]  Chief Complaint  Patient presents with   Ankle Pain    X 2 days ago she hit her ankle with heavy box at work. Has not iced it. Pt noticed ankle swelling that evening. Ankle not bruised .Rates ankle pain as 8 on 0-10 pain scale.    HPI Patient is in today with concerns of swelling to her right ankle x 2 days. She reports dropping a box on her upper leg at work earlier in the day and is unsure if that has contributed to the pain and swelling. Pain is 8/10 in the ankle and right great toe. She has known athletes foot of the right foot and great toe that has not been treated. Shedid not go to work today due to pain.   Review of Systems  Musculoskeletal:        Right ankle pain and swelling. Pain radiates to the right great toe.   All other systems reviewed and are negative.  Past Medical History:  Diagnosis Date   Asthma    GERD (gastroesophageal reflux disease)    Migraines    with aura   STD (sexually transmitted disease)    chlamydia 05-29-2019 treated    Social History   Socioeconomic History   Marital status: Single    Spouse name: Not on file   Number of children: Not on file   Years of education: Not on file   Highest education level: GED or equivalent  Occupational History   Not on file  Tobacco Use   Smoking status: Every Day    Packs/day: 0.25    Years: 7.00    Total pack years: 1.75    Types: Cigarettes   Smokeless tobacco: Never  Vaping Use   Vaping Use: Never used  Substance and Sexual Activity   Alcohol use: No   Drug use: No   Sexual activity: Yes    Partners: Male    Birth control/protection: None  Other Topics Concern   Not on file  Social History Narrative   Not on file   Social Determinants of Health   Financial Resource Strain: Low Risk  (07/01/2021)   Overall Financial Resource Strain (CARDIA)    Difficulty of Paying  Living Expenses: Not very hard  Food Insecurity: No Food Insecurity (07/01/2021)   Hunger Vital Sign    Worried About Running Out of Food in the Last Year: Never true    Ran Out of Food in the Last Year: Never true  Transportation Needs: No Transportation Needs (07/01/2021)   PRAPARE - Administrator, Civil Service (Medical): No    Lack of Transportation (Non-Medical): No  Physical Activity: Sufficiently Active (07/01/2021)   Exercise Vital Sign    Days of Exercise per Week: 5 days    Minutes of Exercise per Session: 30 min  Stress: Stress Concern Present (07/01/2021)   [employer] of Occupational Health - Occupational Stress Questionnaire    Feeling of Stress : Rather much  Social Connections: Socially Isolated (07/01/2021)   Social Connection and Isolation Panel [NHANES]    Frequency of Communication with Friends and Family: Once a week    Frequency of Social Gatherings with Friends and Family: Once a week    Attends Religious Services: 1 to 4 times per year    Active Member of Clubs or Organizations: No  Attends Archivist Meetings: Not on file    Marital Status: Never married  Intimate Partner Violence: Not on file    Past Surgical History:  Procedure Laterality Date   None      Family History  Problem Relation Age of Onset   Hypertension Mother    Breast cancer Mother        in 26's   Hypertension Father    Hypertension Sister    Hypertension Maternal Grandmother    Cancer Paternal Grandmother    Cancer Paternal Grandfather     Allergies  Allergen Reactions   Penicillins     bumps    Current Outpatient Medications on File Prior to Visit  Medication Sig Dispense Refill   albuterol (VENTOLIN HFA) 108 (90 Base) MCG/ACT inhaler Inhale 1-2 puffs into the lungs every 6 (six) hours as needed for wheezing or shortness of breath. 18 g 0   amLODipine (NORVASC) 5 MG tablet Take 1 tablet (5 mg total) by mouth daily. 90 tablet 2   folic acid  (FOLVITE) Q000111Q MCG tablet Take 1 tablet (800 mcg total) by mouth daily. 90 tablet 0   labetalol (NORMODYNE) 100 MG tablet Take 1 tablet (100 mg total) by mouth 2 (two) times daily. 180 tablet 0   medroxyPROGESTERone Acetate 150 MG/ML SUSY Inject 150 mg into the muscle every 3 (three) months.     Melatonin 10 MG TABS Take 1 tablet by mouth at bedtime as needed.     nicotine (NICODERM CQ - DOSED IN MG/24 HOURS) 14 mg/24hr patch Place 1 patch (14 mg total) onto the skin daily. (Patient not taking: Reported on 07/23/2022) 28 patch 0   [DISCONTINUED] fluticasone (FLONASE) 50 MCG/ACT nasal spray Place 2 sprays into both nostrils daily. 1 g 0   [DISCONTINUED] norgestimate-ethinyl estradiol (ORTHO-CYCLEN, 28,) 0.25-35 MG-MCG tablet Take 1 tablet by mouth daily. 28 tablet 2   [DISCONTINUED] omeprazole (PRILOSEC) 40 MG capsule Take 1 capsule (40 mg total) by mouth daily before breakfast. 30 capsule 2   No current facility-administered medications on file prior to visit.    BP 118/68 (BP Location: Right Arm, Patient Position: Sitting, Cuff Size: Large)   Pulse 78   Temp 98.4 F (36.9 C) (Oral)   Wt 279 lb (126.6 kg)   SpO2 98%   BMI 41.20 kg/m chart      Objective:    BP 118/68 (BP Location: Right Arm, Patient Position: Sitting, Cuff Size: Large)   Pulse 78   Temp 98.4 F (36.9 C) (Oral)   Wt 279 lb (126.6 kg)   SpO2 98%   BMI 41.20 kg/m    Physical Exam Vitals and nursing note reviewed.  Constitutional:      Appearance: Normal appearance. She is obese.  Cardiovascular:     Rate and Rhythm: Normal rate and regular rhythm.     Pulses: Normal pulses.     Heart sounds: Normal heart sounds.  Pulmonary:     Effort: Pulmonary effort is normal.     Breath sounds: Normal breath sounds.  Musculoskeletal:        General: Normal range of motion.     Comments: Very mild right ankle and foot swelling. Tenderness to palpation of the right great toe. Fungal infection noted between the toes of  both feet and onto the palmar surfaces.   Skin:    General: Skin is warm and dry.  Neurological:     General: No focal deficit present.  Mental Status: She is alert and oriented to person, place, and time.     Results for orders placed or performed in visit on 07/23/22  CMP  Result Value Ref Range   Sodium 140 135 - 145 mEq/L   Potassium 4.1 3.5 - 5.1 mEq/L   Chloride 106 96 - 112 mEq/L   CO2 28 19 - 32 mEq/L   Glucose, Bld 76 70 - 99 mg/dL   BUN 9 6 - 23 mg/dL   Creatinine, Ser 0.82 0.40 - 1.20 mg/dL   Total Bilirubin 0.5 0.2 - 1.2 mg/dL   Alkaline Phosphatase 39 39 - 117 U/L   AST 19 0 - 37 U/L   ALT 15 0 - 35 U/L   Total Protein 7.5 6.0 - 8.3 g/dL   Albumin 4.1 3.5 - 5.2 g/dL   GFR 94.70 >60.00 mL/min   Calcium 9.5 8.4 - 10.5 mg/dL        Assessment & Plan:   Problem List Items Addressed This Visit   None Visit Diagnoses     Onychomycosis    -  Primary   Relevant Medications   terbinafine (LAMISIL) 250 MG tablet   Other Relevant Orders   CMP (Completed)   Acute right ankle pain       Relevant Orders   DG Ankle Complete Right (Completed)   High risk medication use       Relevant Orders   CMP (Completed)       Meds ordered this encounter  Medications   terbinafine (LAMISIL) 250 MG tablet    Sig: Take 1 tablet (250 mg total) by mouth daily.    Dispense:  90 tablet    Refill:  0   traMADol (ULTRAM) 50 MG tablet    Sig: Take 1 tablet (50 mg total) by mouth every 8 (eight) hours as needed for up to 5 days.    Dispense:  15 tablet    Refill:  0   Discussed with the patient that her symptoms are likely related to the severity of the fungal infection on her foot. Discussed treatment options, benefits vs risk. We elected to go with Lamisil once daily x 12 weeks. Initial BMP will be obtained today and repeated in 4 weeks. Xray was obtained to ensure this is not a workman's comp issues, although a low likelihood.  Return in about 4 weeks (around  08/20/2022).  Kennyth Arnold, FNP

## 2022-08-21 ENCOUNTER — Ambulatory Visit: Payer: Managed Care, Other (non HMO) | Admitting: Family Medicine

## 2022-09-01 NOTE — Progress Notes (Signed)
ACUTE VISIT Chief Complaint  Patient presents with   Hoarse    Since Friday, worse in the mornings.   HPI: Frances Murphy is a 32 y.o. female with PMHx significant for HTN,allergies,tobacco use,and asthma here today complaining of changes in voice since Friday, characterized by a "raspy" quality and temporary loss of voice upon waking, worse this morning. Her voice improves throughout the day but still feels like something needs to be cleared from the throat. There is no associated fever,sore throat, or more nasal congestion than usual. 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 In the last two days, she has noticed a slight increase in rhinorrhea.  She denies sick contact. No changes in appetite,night sweats,dysphagia, or body aches.  She continues to smoke.  Asthma: She has been using an albuterol inh due to occasional coughing and mild congestion.  Negative for wheezing or SOB.  GERD: Occasional heartburn. She takes OTC medication as needed. Symptoms exacerbated by consuming greasy foods, which she tried to avoid.  She is reporting a long-standing issue with toenail fungus and athlete's foot, primarily affecting 5th toes of both feet. Despite trying various treatments, the problem persists and has recurred after temporary improvement. She was recently prescribed oral Lamisil but she is concerned about taking medication that may affect the liver. Requesting referral to podiatrist.  HTN on Labetalol 100 mg bid and Amlodipine 5 mg daily. Negative for severe/frequent headache, visual changes, chest pain, dyspnea, palpitation, focal weakness, or edema.  Lab Results  Component Value Date   CREATININE 0.82 07/23/2022   BUN 9 07/23/2022   NA 140 07/23/2022   K 4.1 07/23/2022   CL 106 07/23/2022   CO2 28 07/23/2022   Review of Systems  Constitutional:  Negative for activity change, appetite change and fever.  HENT:  Negative for mouth sores and  nosebleeds.   Eyes:  Negative for redness and visual disturbance.  Respiratory:  Negative for stridor.   Gastrointestinal:  Negative for abdominal pain, nausea and vomiting.       Negative for changes in bowel habits.  Genitourinary:  Negative for decreased urine volume, dysuria and hematuria.  Allergic/Immunologic: Positive for environmental allergies.  Neurological:  Negative for syncope and facial asymmetry.  See other pertinent positives and negatives in HPI.  Current Outpatient Medications on File Prior to Visit  Medication Sig Dispense Refill   albuterol (VENTOLIN HFA) 108 (90 Base) MCG/ACT inhaler Inhale 1-2 puffs into the lungs every 6 (six) hours as needed for wheezing or shortness of breath. 18 g 0   amLODipine (NORVASC) 5 MG tablet Take 1 tablet (5 mg total) by mouth daily. 90 tablet 2   folic acid (FOLVITE) 865 MCG tablet Take 1 tablet (800 mcg total) by mouth daily. 90 tablet 0   labetalol (NORMODYNE) 100 MG tablet Take 1 tablet (100 mg total) by mouth 2 (two) times daily. 180 tablet 0   medroxyPROGESTERone Acetate 150 MG/ML SUSY Inject 150 mg into the muscle every 3 (three) months.     Melatonin 10 MG TABS Take 1 tablet by mouth at bedtime as needed.     nicotine (NICODERM CQ - DOSED IN MG/24 HOURS) 14 mg/24hr patch Place 1 patch (14 mg total) onto the skin daily. 28 patch 0   terbinafine (LAMISIL) 250 MG tablet Take 1 tablet (250 mg total) by mouth daily. 90 tablet 0   [DISCONTINUED] norgestimate-ethinyl estradiol (ORTHO-CYCLEN, 28,) 0.25-35 MG-MCG tablet Take 1 tablet by  mouth daily. 28 tablet 2   [DISCONTINUED] omeprazole (PRILOSEC) 40 MG capsule Take 1 capsule (40 mg total) by mouth daily before breakfast. 30 capsule 2   No current facility-administered medications on file prior to visit.   Past Medical History:  Diagnosis Date   Asthma    GERD (gastroesophageal reflux disease)    Migraines    with aura   STD (sexually transmitted disease)    chlamydia 05-29-2019  treated   Allergies  Allergen Reactions   Penicillins     bumps    Social History   Socioeconomic History   Marital status: Single    Spouse name: Not on file   Number of children: Not on file   Years of education: Not on file   Highest education level: GED or equivalent  Occupational History   Not on file  Tobacco Use   Smoking status: Every Day    Packs/day: 0.25    Years: 7.00    Total pack years: 1.75    Types: Cigarettes   Smokeless tobacco: Never  Vaping Use   Vaping Use: Never used  Substance and Sexual Activity   Alcohol use: No   Drug use: No   Sexual activity: Yes    Partners: Male    Birth control/protection: None  Other Topics Concern   Not on file  Social History Narrative   Not on file   Social Determinants of Health   Financial Resource Strain: Low Risk  (07/01/2021)   Overall Financial Resource Strain (CARDIA)    Difficulty of Paying Living Expenses: Not very hard  Food Insecurity: No Food Insecurity (07/01/2021)   Hunger Vital Sign    Worried About Running Out of Food in the Last Year: Never true    Ran Out of Food in the Last Year: Never true  Transportation Needs: No Transportation Needs (07/01/2021)   PRAPARE - Hydrologist (Medical): No    Lack of Transportation (Non-Medical): No  Physical Activity: Sufficiently Active (07/01/2021)   Exercise Vital Sign    Days of Exercise per Week: 5 days    Minutes of Exercise per Session: 30 min  Stress: Stress Concern Present (07/01/2021)   Melbeta    Feeling of Stress : Rather much  Social Connections: Socially Isolated (07/01/2021)   Social Connection and Isolation Panel [NHANES]    Frequency of Communication with Friends and Family: Once a week    Frequency of Social Gatherings with Friends and Family: Once a week    Attends Religious Services: 1 to 4 times per year    Active Member of [employer] or  Organizations: No    Attends Music therapist: Not on file    Marital Status: Never married   Today's Vitals   09/02/22 1421  BP: 120/78  Pulse: 76  Resp: 12  Temp: 98.9 F (37.2 C)  TempSrc: Oral  Weight: 280 lb 6 oz (127.2 kg)  Height: 5\' 9"  (1.753 m)   Body mass index is 41.4 kg/m.  Physical Exam Vitals and nursing note reviewed.  Constitutional:      General: She is not in acute distress.    Appearance: She is well-developed.  HENT:     Head: Normocephalic and atraumatic.     Right Ear: Tympanic membrane, ear canal and external ear normal.     Left Ear: Tympanic membrane, ear canal and external ear normal.     Nose:  Rhinorrhea present.     Right Turbinates: Enlarged.     Left Turbinates: Not enlarged.     Mouth/Throat:     Mouth: Mucous membranes are moist.     Pharynx: Oropharynx is clear.  Eyes:     Conjunctiva/sclera: Conjunctivae normal.  Cardiovascular:     Rate and Rhythm: Normal rate and regular rhythm.     Pulses:          Dorsalis pedis pulses are 2+ on the right side and 2+ on the left side.     Heart sounds: No murmur heard. Pulmonary:     Effort: Pulmonary effort is normal. No respiratory distress.     Breath sounds: Normal breath sounds.  Abdominal:     Palpations: Abdomen is soft. There is no hepatomegaly or mass.     Tenderness: There is no abdominal tenderness.  Musculoskeletal:     Comments: Right great toe nail with hypertrophic toe nail, irregular surface. There is no periungual edema or erythema.  Lymphadenopathy:     Cervical: No cervical adenopathy.  Skin:    General: Skin is warm.     Findings: No erythema or rash.  Neurological:     General: No focal deficit present.     Mental Status: She is alert and oriented to person, place, and time.     Cranial Nerves: No cranial nerve deficit.     Gait: Gait normal.  Psychiatric:        Mood and Affect: Affect normal. Mood is anxious.   ASSESSMENT AND PLAN:  Ms. Shatyra was  seen today for hoarse.  Diagnoses and all orders for this visit:  Onychomycosis Chronic. She doe snot want to try oral Lamisil, afraid of possible side effects.  We discussed prognosis, high risk of recurrences.  She would like to see podiatrist.  -     Ambulatory referral to Podiatry  Tinea pedis of both feet Chronic. We discussed treatment options, topical with clotrimazole cream and soaking feet in vinegar with water 1:1 a few times per week. She would like to establish with podiatrist, referral placed.  -     Ambulatory referral to Podiatry  Dysphonia We discussed possible etiologies. Hx and examination do not suggest a serious process. ? Viral,allergies,and GI among some to consider. Smoking cessation encouraged. It has been just 5 days, so recommend Flonase nasla spray for now. Monitor for new symptoms. If persistent we can consider ENT evaluation. Instructed about warning signs.  Essential hypertension Problem is adequately controlled. Continue Labetalol 100 mg bid and Amlodipine 5 mg daily. Low salt diet to continue and periodic eye exam.  Gastroesophageal reflux disease This problem can be contributing to some of her symptoms. For now recommend continuing GERD [precautions and OTC PPI daily as needed, Prilosec 20 mg. If dysphonia is persistent we can consider increasing frequency and dose.  Allergic rhinitis Recommend trying Flonase nasal spray daily for 10-14 days at bedtime. Nasal saline irrigations as needed.  Return in about 6 months (around 03/03/2023).  Dawson Albers G. [place], MD  [facility]. [place] office.

## 2022-09-02 ENCOUNTER — Encounter: Payer: Self-pay | Admitting: Family Medicine

## 2022-09-02 ENCOUNTER — Ambulatory Visit: Payer: Managed Care, Other (non HMO) | Admitting: Family Medicine

## 2022-09-02 VITALS — BP 120/78 | HR 76 | Temp 98.9°F | Resp 12 | Ht 69.0 in | Wt 280.4 lb

## 2022-09-02 DIAGNOSIS — R490 Dysphonia: Secondary | ICD-10-CM

## 2022-09-02 DIAGNOSIS — I10 Essential (primary) hypertension: Secondary | ICD-10-CM

## 2022-09-02 DIAGNOSIS — J309 Allergic rhinitis, unspecified: Secondary | ICD-10-CM

## 2022-09-02 DIAGNOSIS — B351 Tinea unguium: Secondary | ICD-10-CM

## 2022-09-02 DIAGNOSIS — B353 Tinea pedis: Secondary | ICD-10-CM | POA: Diagnosis not present

## 2022-09-02 DIAGNOSIS — K219 Gastro-esophageal reflux disease without esophagitis: Secondary | ICD-10-CM | POA: Diagnosis not present

## 2022-09-02 MED ORDER — FLUTICASONE PROPIONATE 50 MCG/ACT NA SUSP: 1.0000 | Freq: Two times a day (BID) | NASAL | 1 refills | Status: DC

## 2022-09-02 NOTE — Patient Instructions (Signed)
A few things to remember from today's visit:  Onychomycosis - Plan: Ambulatory referral to Podiatry  Tinea pedis of both feet - Plan: Ambulatory referral to Podiatry  Dysphonia  Gastroesophageal reflux disease, unspecified whether esophagitis present  Allergic rhinitis, unspecified seasonality, unspecified trigger - Plan: fluticasone (FLONASE) 50 MCG/ACT nasal spray  If you need refills for medications you take chronically, please call your pharmacy. Do not use My Chart to request refills or for acute issues that need immediate attention. If you send a my chart message, it may take a few days to be addressed, specially if I am not in the office.  Please be sure medication list is accurate. If a new problem present, please set up appointment sooner than planned today.

## 2022-09-05 NOTE — Assessment & Plan Note (Signed)
Problem is adequately controlled. Continue Labetalol 100 mg bid and Amlodipine 5 mg daily. Low salt diet to continue and periodic eye exam.

## 2022-09-05 NOTE — Assessment & Plan Note (Signed)
Recommend trying Flonase nasal spray daily for 10-14 days at bedtime. Nasal saline irrigations as needed.

## 2022-09-05 NOTE — Assessment & Plan Note (Signed)
This problem can be contributing to some of her symptoms. For now recommend continuing GERD [precautions and OTC PPI daily as needed, Prilosec 20 mg. If dysphonia is persistent we can consider increasing frequency and dose.

## 2022-09-14 ENCOUNTER — Ambulatory Visit: Payer: Managed Care, Other (non HMO) | Admitting: Podiatry

## 2022-09-25 ENCOUNTER — Ambulatory Visit: Payer: Managed Care, Other (non HMO) | Admitting: Podiatry

## 2022-10-05 IMAGING — CT CT HEAD W/O CM
3 of 4 series · 15 of 47 positions shown, 18 images · non-contrast
Comparison: None.

CLINICAL DATA: Headache, sudden, severe



[Series 3: head 2.0 hr68 · axial · 0.40mm/px · z∈[+134,+246]mm · 9 of 70 slices shown, 12 images]
[im 7/70  brain]
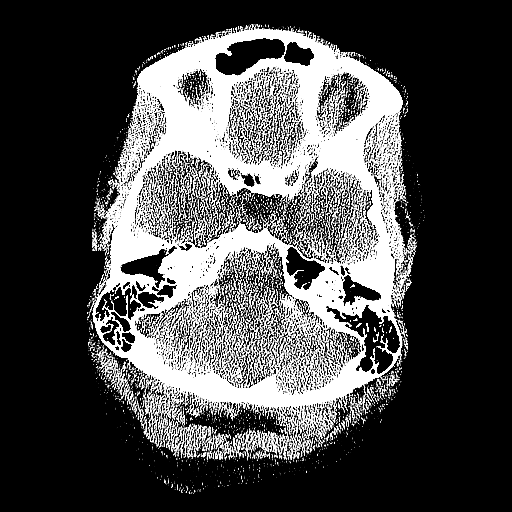
[im 7/70  bone]
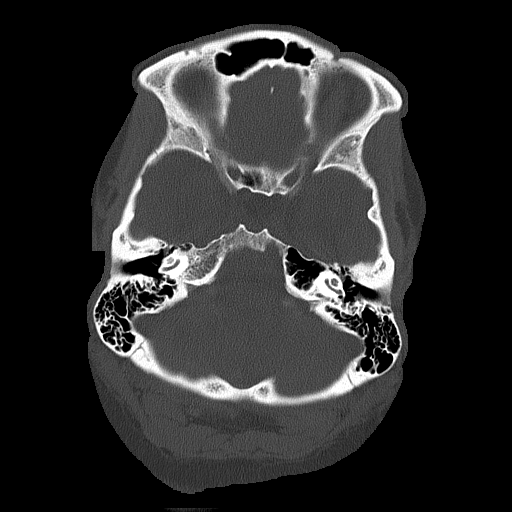
[im 14/70  brain]
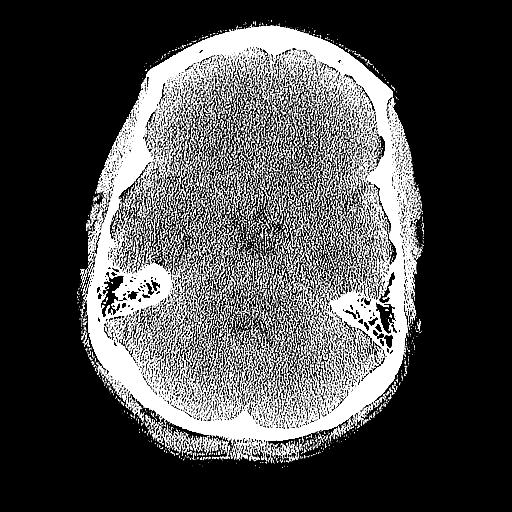
[im 21/70  brain]
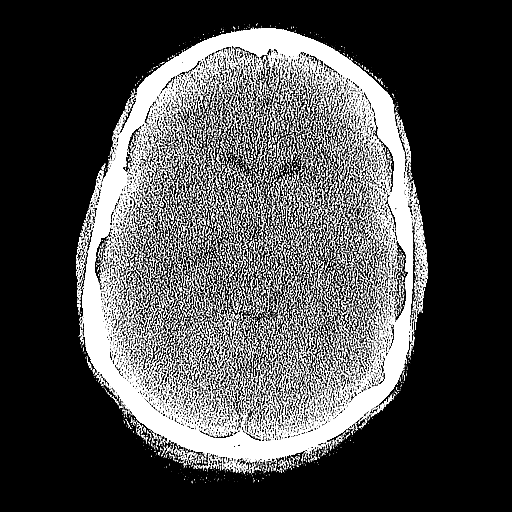
[im 28/70  brain]
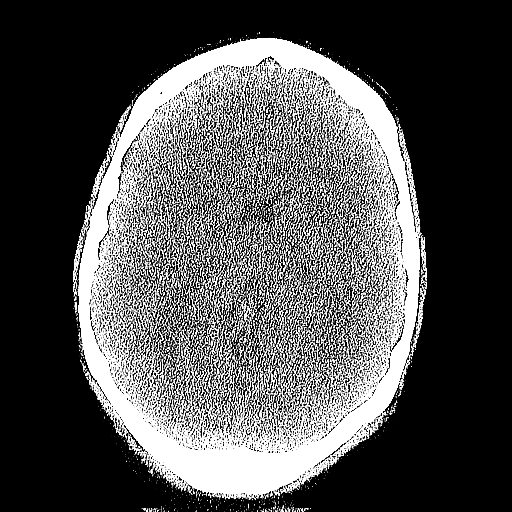
[im 35/70  brain]
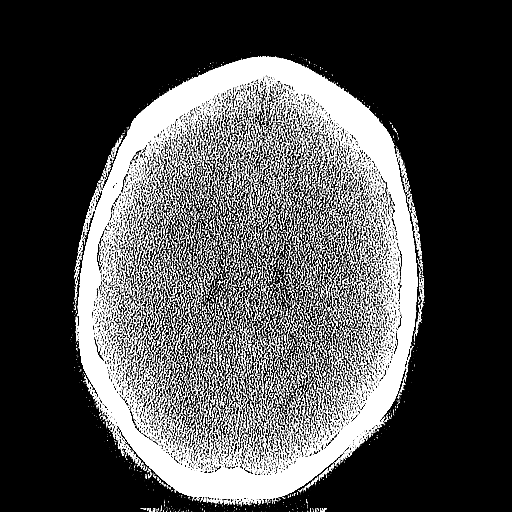
[im 35/70  bone]
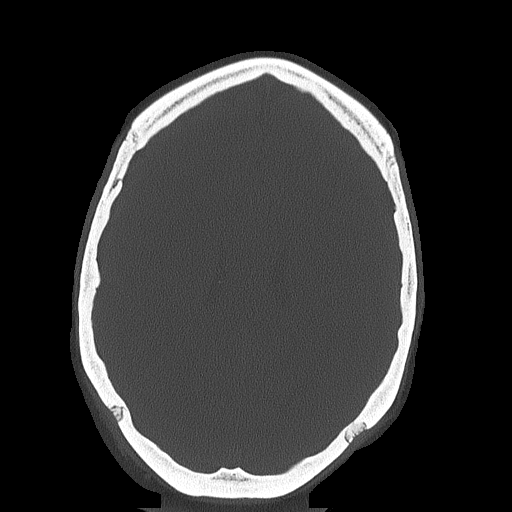
[im 42/70  brain]
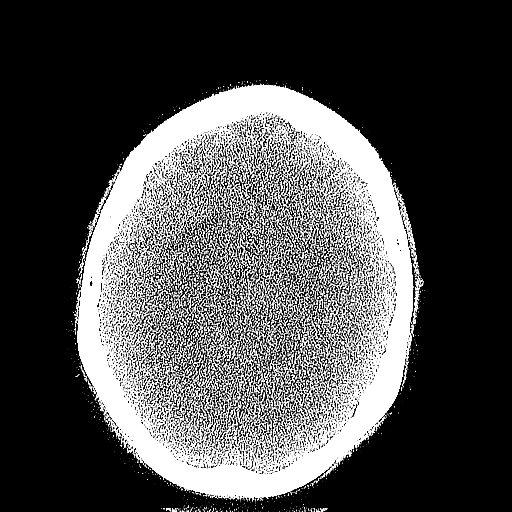
[im 49/70  brain]
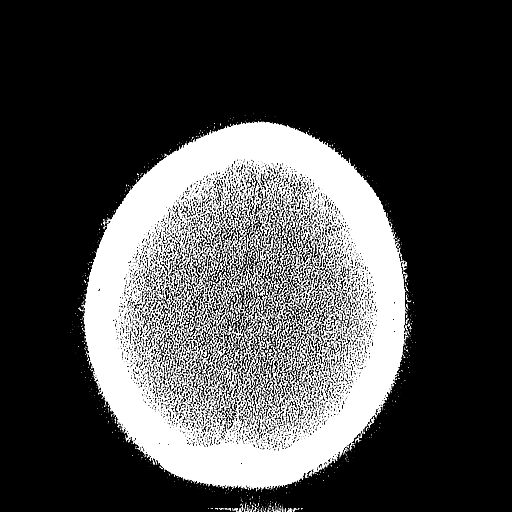
[im 56/70  brain]
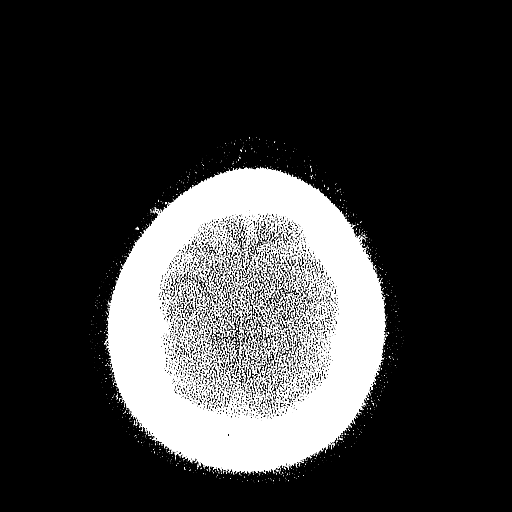
[im 63/70  brain]
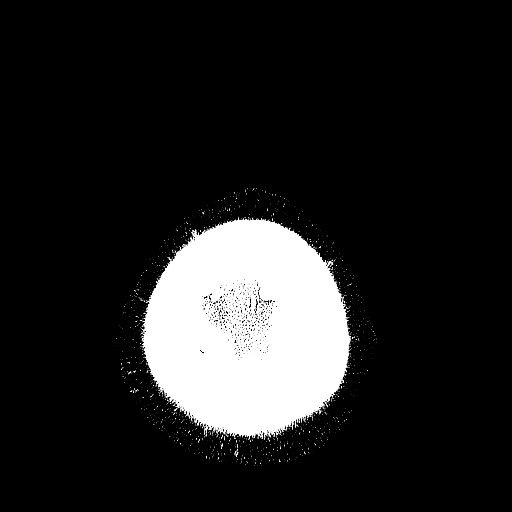
[im 63/70  bone]
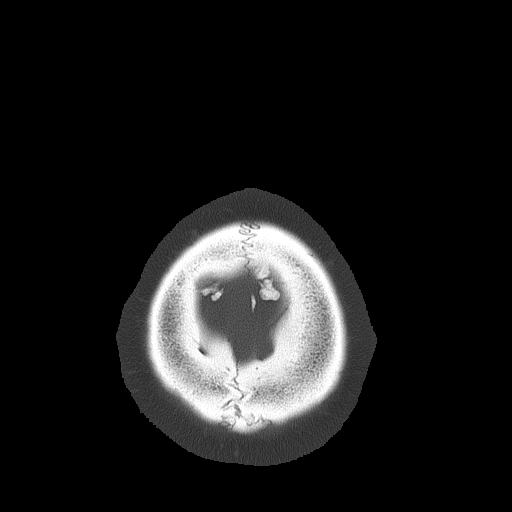

[Series 4: head 3.0 mpr cor · coronal · 0.29mm/px · 3 of 65 slices shown]
[im 22/65  brain]
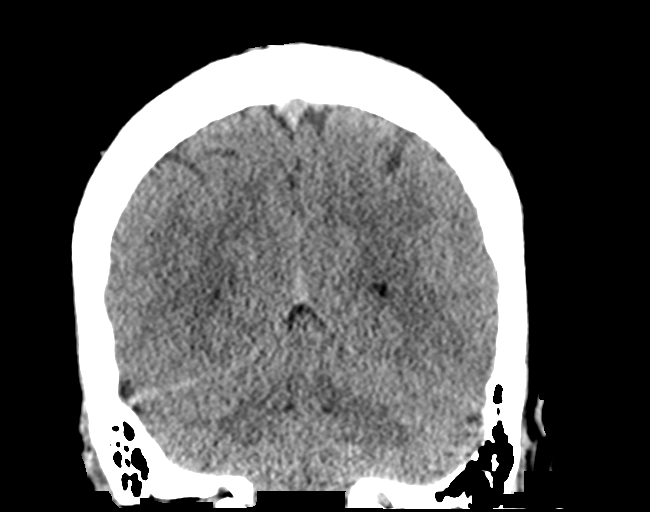
[im 29/65  brain]
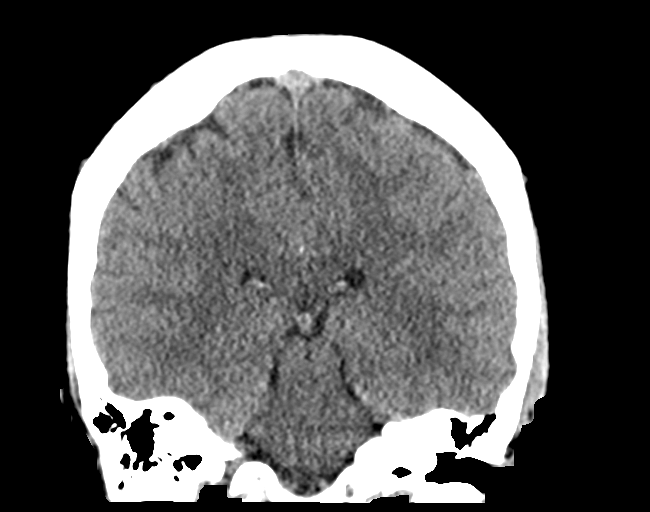
[im 36/65  brain]
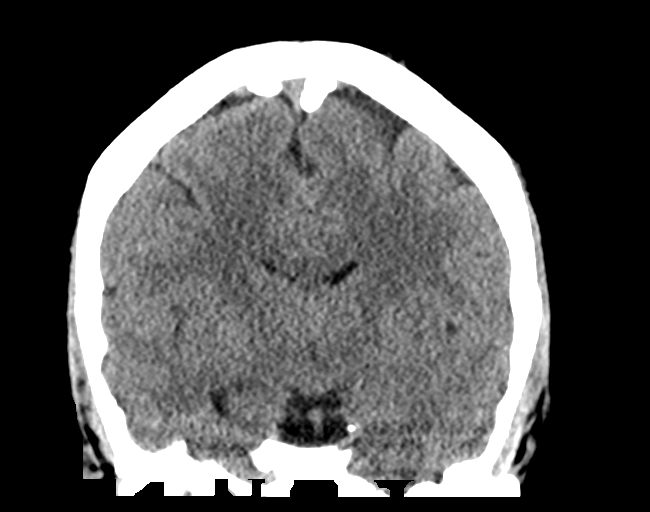

[Series 5: head 3.0 mpr sag · sagittal · 0.27mm/px · 3 of 56 slices shown]
[im 19/56  brain]
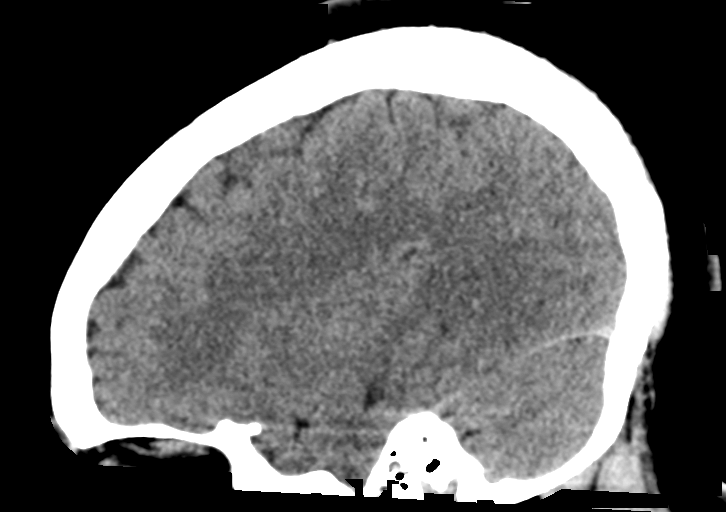
[im 28/56  brain]
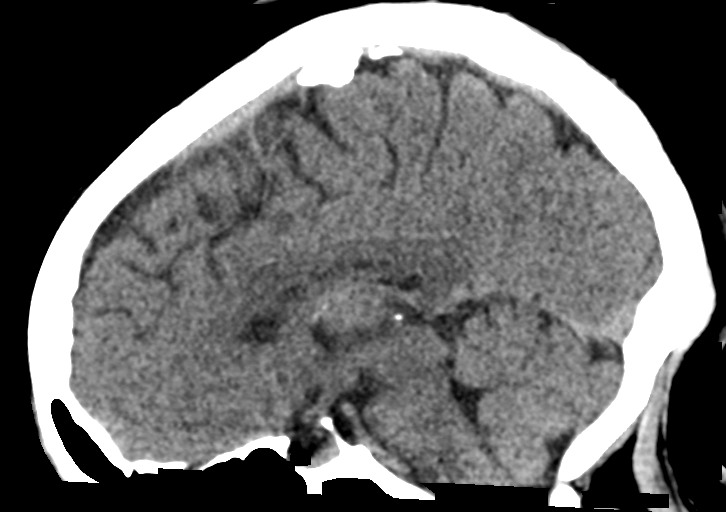
[im 37/56  brain]
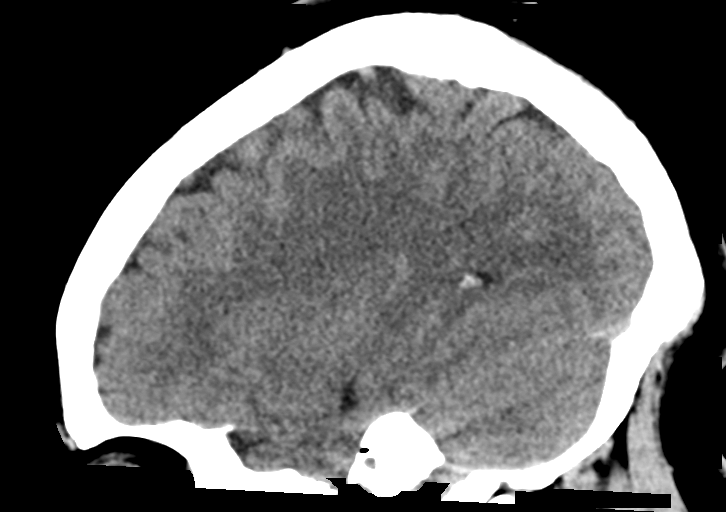

[15 of 47 positions shown; findings below may reference images not displayed]

FINDINGS: Brain: There is no acute intracranial hemorrhage, mass effect, or
edema. Gray-white differentiation is preserved. There is no
extra-axial fluid collection. Ventricles and sulci are within normal
limits in size and configuration.

Vascular: No hyperdense vessel or unexpected calcification.

Skull: Calvarium is unremarkable.

Sinuses/Orbits: No acute finding.

Other: None.
IMPRESSION: Normal CT of the head.

## 2022-10-28 ENCOUNTER — Other Ambulatory Visit: Payer: Self-pay | Admitting: Family Medicine

## 2022-10-28 DIAGNOSIS — I10 Essential (primary) hypertension: Secondary | ICD-10-CM

## 2023-01-30 ENCOUNTER — Other Ambulatory Visit: Payer: Self-pay | Admitting: Family Medicine

## 2023-01-30 DIAGNOSIS — I10 Essential (primary) hypertension: Secondary | ICD-10-CM

## 2023-03-23 ENCOUNTER — Other Ambulatory Visit: Payer: Self-pay

## 2023-03-23 ENCOUNTER — Ambulatory Visit
Admission: RE | Admit: 2023-03-23 | Discharge: 2023-03-23 | Disposition: A | Discharge status: Home / Self Care | Payer: Managed Care, Other (non HMO) | Source: Ambulatory Visit | Attending: Internal Medicine | Admitting: Internal Medicine

## 2023-03-23 VITALS — BP 112/74 | HR 90 | Temp 98.3°F | Resp 18

## 2023-03-23 DIAGNOSIS — J4521 Mild intermittent asthma with (acute) exacerbation: Secondary | ICD-10-CM

## 2023-03-23 MED ORDER — ALBUTEROL SULFATE HFA 108 (90 BASE) MCG/ACT IN AERS: 1.0000 | INHALATION_SPRAY | Freq: Four times a day (QID) | RESPIRATORY_TRACT | 0 refills | Status: DC | PRN

## 2023-03-23 MED ORDER — PREDNISONE 20 MG PO TABS: 40.0000 mg | ORAL_TABLET | Freq: Every day | ORAL | 0 refills | Status: AC

## 2023-03-23 MED ORDER — BENZONATATE 200 MG PO CAPS: 200.0000 mg | ORAL_CAPSULE | Freq: Three times a day (TID) | ORAL | 0 refills | Status: DC | PRN

## 2023-03-23 NOTE — ED Triage Notes (Signed)
Pt presents to UC w/ c/o cough and chest tightness since yesterday. Hx asthma

## 2023-03-23 NOTE — Discharge Instructions (Signed)
I refilled your albuterol inhaler that you may use as needed for wheezing/shortness of breath.  Prednisone daily for 5 days and you also may take Tessalon as needed for cough.  Lots of rest and fluids.  Please follow-up with your PCP if your symptoms do not improve.  Please go to the ER for any worsening symptoms.  I hope you feel better soon!

## 2023-03-23 NOTE — ED Provider Notes (Signed)
[facility]    CSN: [id number] Arrival date & time: 03/23/23  1011      History   Chief Complaint Chief Complaint  Patient presents with   Wheezing    Asthma flared up yesterday. Dry cough and little chest tightening. - Entered by patient    HPI Frances Murphy is a 32 y.o. female presents for asthma exacerbation.  Patient reports yesterday she began having a dry cough with wheezing and chest tightness.  She denies any URI symptoms including runny nose/congestion, sore throat, fevers or chills, body aches, ear pain.  She does have a history of asthma.  States she has not albuterol inhaler but did not use it last night as she felt it was expired. NO otc medications have been used since sx onset. No other concerns at this time.    Wheezing Associated symptoms: chest tightness and cough     Past Medical History:  Diagnosis Date   Asthma    GERD (gastroesophageal reflux disease)    Migraines    with aura   STD (sexually transmitted disease)    chlamydia 05-29-2019 treated    Patient Active Problem List   Diagnosis Date Noted   Gastroesophageal reflux disease 09/02/2022   Allergic rhinitis 09/02/2022   Amenorrhea 11/20/2020   BMI 45.0-49.9, adult (HCC) 11/20/2020   Essential hypertension 10/21/2020   Preterm delivery, delivered 04/13/2013   Preterm premature rupture of membranes 02/01/2013   Premature labor 01/26/2013   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 Authorizing  Provider  albuterol (VENTOLIN HFA) 108 (90 Base) MCG/ACT inhaler Inhale 1-2 puffs into the lungs every 6 (six) hours as needed for wheezing or shortness of breath. 03/23/23  Yes Radford Pax, NP  benzonatate (TESSALON) 200 MG capsule Take 1 capsule (200 mg total) by mouth 3 (three) times daily as needed. 03/23/23  Yes Radford Pax, NP  predniSONE (DELTASONE) 20 MG tablet Take 2 tablets (40 mg total) by mouth daily with breakfast for 5 days. 03/23/23 03/28/23 Yes Radford Pax, NP  amLODipine (NORVASC) 5 MG tablet Take 1 tablet by mouth once daily 02/01/23   [place], Betty G, MD  fluticasone [facility]) 50 MCG/ACT nasal spray Place 1 spray into both nostrils 2 (two) times daily. 09/02/22   [place], Betty G, MD  folic acid (FOLVITE) 800 MCG tablet Take 1 tablet (800 mcg total) by mouth daily. 04/08/21   [place], Betty G, MD  labetalol (NORMODYNE) 100 MG tablet Take 1 tablet by mouth twice daily 10/28/22   [place], Betty G, MD  medroxyPROGESTERone Acetate 150 MG/ML SUSY Inject 150 mg into the muscle every 3 (three) months. 07/17/22   [provider]  Melatonin 10 MG TABS Take 1  tablet by mouth at bedtime as needed.    [provider]  nicotine (NICODERM CQ - DOSED IN MG/24 HOURS) 14 mg/24hr patch Place 1 patch (14 mg total) onto the skin daily. 07/01/21   [place], Betty G, MD  terbinafine (LAMISIL) 250 MG tablet Take 1 tablet (250 mg total) by mouth daily. 07/23/22   Eulis Foster, FNP  norgestimate-ethinyl estradiol (ORTHO-CYCLEN, 28,) 0.25-35 MG-MCG tablet Take 1 tablet by mouth daily. 07/25/20 10/19/20  [place], Betty G, MD  omeprazole (PRILOSEC) 40 MG capsule Take 1 capsule (40 mg total) by mouth daily before breakfast. 05/17/20 07/09/20  [place], Betty G, MD    Family History Family History  Problem Relation Age of Onset   Hypertension Mother    Breast cancer Mother        in 33's   Hypertension Father    Hypertension Sister    Hypertension Maternal Grandmother    Cancer Paternal  Grandmother    Cancer Paternal Grandfather     Social History Social History   Tobacco Use   Smoking status: Every Day    Current packs/day: 0.25    Average packs/day: 0.3 packs/day for 7.0 years (1.8 ttl pk-yrs)    Types: Cigarettes   Smokeless tobacco: Never  Vaping Use   Vaping status: Never Used  Substance Use Topics   Alcohol use: No   Drug use: No     Allergies   Penicillins   Review of Systems Review of Systems  Respiratory:  Positive for cough, chest tightness and wheezing.      Physical Exam Triage Vital Signs ED Triage Vitals  Encounter Vitals Group     BP 03/23/23 1022 112/74     Systolic BP Percentile --      Diastolic BP Percentile --      Pulse Rate 03/23/23 1022 90     Resp 03/23/23 1022 18     Temp 03/23/23 1022 98.3 F (36.8 C)     Temp Source 03/23/23 1022 Oral     SpO2 03/23/23 1022 96 %     Weight --      Height --      Head Circumference --      Peak Flow --      Pain Score 03/23/23 1026 0     Pain Loc --      Pain Education --      Exclude from Growth Chart --    No data found.  Updated Vital Signs BP 112/74 (BP Location: Right Arm)   Pulse 90   Temp 98.3 F (36.8 C) (Oral)   Resp 18   SpO2 96%   Visual Acuity Right Eye Distance:   Left Eye Distance:   Bilateral Distance:    Right Eye Near:   Left Eye Near:    Bilateral Near:     Physical Exam Vitals and nursing note reviewed.  Constitutional:      General: She is not in acute distress.    Appearance: She is well-developed. She is not ill-appearing.  HENT:     Head: Normocephalic and atraumatic.     Right Ear: Tympanic membrane and ear canal normal.     Left Ear: Tympanic membrane and ear canal normal.     Nose: No congestion or rhinorrhea.     Mouth/Throat:     Mouth: Mucous membranes are moist.     Pharynx: Oropharynx is clear. Uvula midline. No oropharyngeal exudate or posterior oropharyngeal erythema.  Tonsils: No tonsillar exudate or tonsillar  abscesses.  Eyes:     Conjunctiva/sclera: Conjunctivae normal.     Pupils: Pupils are equal, round, and reactive to light.  Cardiovascular:     Rate and Rhythm: Normal rate and regular rhythm.     Heart sounds: Normal heart sounds.  Pulmonary:     Effort: Pulmonary effort is normal. No respiratory distress.     Breath sounds: Normal breath sounds. No stridor. No wheezing, rhonchi or rales.  Chest:     Chest wall: No tenderness.  Musculoskeletal:     Cervical back: Normal range of motion and neck supple.  Lymphadenopathy:     Cervical: No cervical adenopathy.  Skin:    General: Skin is warm and dry.  Neurological:     General: No focal deficit present.     Mental Status: She is alert and oriented to person, place, and time.  Psychiatric:        Mood and Affect: Mood normal.        Behavior: Behavior normal.      UC Treatments / Results  Labs (all labs ordered are listed, but only abnormal results are displayed) Labs Reviewed - No data to display  EKG   Radiology No results found.  Procedures Procedures (including critical care time)  Medications Ordered in UC Medications - No data to display  Initial Impression / Assessment and Plan / UC Course  I have reviewed the triage vital signs and the nursing notes.  Pertinent labs & imaging results that were available during my care of the patient were reviewed by me and considered in my medical decision making (see chart for details).     Reviewed exam and symptoms with patient.  No red flags on exam.  Patient reporting asthma exacerbation symptoms without URI symptoms.  She does decline COVID testing.  I did refill her albuterol inhaler and will start prednisone and Tessalon.  Advised to follow-up with her PCP if symptoms do not improve.  Work note provided.  ER precautions reviewed and patient verbalized understanding. Final Clinical Impressions(s) / UC Diagnoses   Final diagnoses:  Mild intermittent asthma with acute  exacerbation     Discharge Instructions      I refilled your albuterol inhaler that you may use as needed for wheezing/shortness of breath.  Prednisone daily for 5 days and you also may take Tessalon as needed for cough.  Lots of rest and fluids.  Please follow-up with your PCP if your symptoms do not improve.  Please go to the ER for any worsening symptoms.  I hope you feel better soon!    ED Prescriptions     Medication Sig Dispense Auth. Provider   albuterol (VENTOLIN HFA) 108 (90 Base) MCG/ACT inhaler Inhale 1-2 puffs into the lungs every 6 (six) hours as needed for wheezing or shortness of breath. 1 each Radford Pax, NP   predniSONE (DELTASONE) 20 MG tablet Take 2 tablets (40 mg total) by mouth daily with breakfast for 5 days. 10 tablet Radford Pax, NP   benzonatate (TESSALON) 200 MG capsule Take 1 capsule (200 mg total) by mouth 3 (three) times daily as needed. 20 capsule Radford Pax, NP      PDMP not reviewed this encounter.   Radford Pax, NP 03/23/23 [phone]

## 2023-05-04 ENCOUNTER — Other Ambulatory Visit: Payer: Self-pay | Admitting: Family Medicine

## 2023-05-04 DIAGNOSIS — I10 Essential (primary) hypertension: Secondary | ICD-10-CM

## 2023-06-12 ENCOUNTER — Other Ambulatory Visit: Payer: Self-pay | Admitting: Family Medicine

## 2023-06-12 DIAGNOSIS — I10 Essential (primary) hypertension: Secondary | ICD-10-CM

## 2023-07-06 ENCOUNTER — Encounter: Payer: Self-pay | Admitting: Family Medicine

## 2023-09-21 ENCOUNTER — Other Ambulatory Visit: Payer: Self-pay | Admitting: Family Medicine

## 2023-09-21 DIAGNOSIS — I10 Essential (primary) hypertension: Secondary | ICD-10-CM

## 2023-10-27 ENCOUNTER — Other Ambulatory Visit: Payer: Self-pay | Admitting: Family Medicine

## 2023-10-27 DIAGNOSIS — I10 Essential (primary) hypertension: Secondary | ICD-10-CM

## 2023-11-16 ENCOUNTER — Other Ambulatory Visit: Payer: Self-pay | Admitting: Family Medicine

## 2023-11-16 DIAGNOSIS — I10 Essential (primary) hypertension: Secondary | ICD-10-CM

## 2023-11-17 MED ORDER — LABETALOL HCL 100 MG PO TABS: 100.0000 mg | ORAL_TABLET | Freq: Two times a day (BID) | ORAL | 0 refills | Status: DC

## 2023-12-03 ENCOUNTER — Other Ambulatory Visit: Payer: Self-pay | Admitting: Family Medicine

## 2023-12-03 DIAGNOSIS — I10 Essential (primary) hypertension: Secondary | ICD-10-CM

## 2023-12-03 NOTE — Telephone Encounter (Signed)
 Copied from CRM [id number]. Topic: Clinical - Medication Refill >> Dec 03, 2023  9:15 AM Adonis Hoot wrote: Most Recent Primary Care Visit:  Provider: [place], BETTY G  Department: [facility]  Visit Type: OFFICE VISIT  Date: 09/02/2022  Medication: amLODipine  (NORVASC ) 5 MG tablet  Has the patient contacted their pharmacy? No (Agent: If no, request that the patient contact the pharmacy for the refill. If patient does not wish to contact the pharmacy document the reason why and proceed with request.) (Agent: If yes, when and what did the pharmacy advise?)  Is this the correct pharmacy for this prescription? Yes If no, delete pharmacy and type the correct one.  This is the patient's preferred pharmacy:  [facility] [address] [place] - [address] [place] [address] Phone: [phone] Fax: [phone]   Has the prescription been filled recently? Yes  Is the patient out of the medication? Yes  Has the patient been seen for an appointment in the last year OR does the patient have an upcoming appointment? Yes  Can we respond through MyChart? Yes  Agent: Please be advised that Rx refills may take up to 3 business days. We ask that you follow-up with your pharmacy.

## 2023-12-07 ENCOUNTER — Encounter: Payer: Self-pay | Admitting: Family Medicine

## 2023-12-07 ENCOUNTER — Ambulatory Visit (INDEPENDENT_AMBULATORY_CARE_PROVIDER_SITE_OTHER): Admitting: Family Medicine

## 2023-12-07 VITALS — BP 124/80 | HR 74 | Resp 12 | Ht 69.0 in | Wt 268.0 lb

## 2023-12-07 DIAGNOSIS — Z13228 Encounter for screening for other metabolic disorders: Secondary | ICD-10-CM

## 2023-12-07 DIAGNOSIS — Z0000 Encounter for general adult medical examination without abnormal findings: Secondary | ICD-10-CM | POA: Diagnosis not present

## 2023-12-07 DIAGNOSIS — Z1329 Encounter for screening for other suspected endocrine disorder: Secondary | ICD-10-CM

## 2023-12-07 DIAGNOSIS — Z13220 Encounter for screening for lipoid disorders: Secondary | ICD-10-CM

## 2023-12-07 DIAGNOSIS — E049 Nontoxic goiter, unspecified: Secondary | ICD-10-CM

## 2023-12-07 DIAGNOSIS — I10 Essential (primary) hypertension: Secondary | ICD-10-CM

## 2023-12-07 DIAGNOSIS — Z130 Encounter for screening for diseases of the blood and blood-forming organs and certain disorders involving the immune mechanism: Secondary | ICD-10-CM | POA: Diagnosis not present

## 2023-12-07 NOTE — Assessment & Plan Note (Signed)
 BP adequately controlled. Recommend monitoring BP at home at least once per month. Continue Labetalol  100 mg bid and Amlodipine  5 mg daily as well as low salt diet. Eye exam is current. As far as problem is stable, annual follow up is appropriate.

## 2023-12-07 NOTE — Progress Notes (Unsigned)
 HPI: Frances Murphy is a 33 y.o. female with a PMHx significant for HTN, allergies, GERD, and asthma, who is here today for her routine physical.  Last CPE: Over a year ago.  Exercise: Patient states she has been using a pilates exercise board twice per week and also is active at work.  Diet: She is both cooking and eating out occasionally. She tries to eat fruit and vegetables daily.  Sleep: Reports 6-7 hours per night.  Alcohol Use: none Smoking: She smokes 4-5 cigarettes at work and one at home. (5-6 total per day) Vision: She has an eye doctor but is looking for a new one.  Dental: UTD on routine dental care.   Immunization History  Administered Date(s) Administered   DTaP 08/03/2014   Tdap 03/12/2014   Health Maintenance  Topic Date Due   COVID-19 Vaccine (1) 12/23/2023 (Originally [birth year]/1995)   INFLUENZA VACCINE  03/03/2024   DTaP/Tdap/Td (3 - Td or Tdap) 08/03/2024   Cervical Cancer Screening (HPV/Pap Cotest)  07/15/2025   Hepatitis C Screening  Completed   HIV Screening  Completed   HPV VACCINES  Aged Out   Meningococcal B Vaccine  Aged Out   Pneumococcal Vaccine 73-50 Years old  Discontinued   Chronic medical problems:   Hypertension:  Medications: Currently on amlodipine  5 mg daily and labetalol  100 mg daily.  BP readings at home: She rarely checks her BP at home unless she has a headache.   Lab Results  Component Value Date   CREATININE 0.82 07/23/2022   BUN 9 07/23/2022   NA 140 07/23/2022   K 4.1 07/23/2022   CL 106 07/23/2022   CO2 28 07/23/2022   Gynecologic care:  No longer on birth control. She is still trying to get pregnant.  Says her menstrual cycles are still irregular.  She is taking a daily prenatal vitamin. She follows regularly with gynecology.   No new concerns today.   Review of Systems  Constitutional:  Negative for activity change, appetite change and fever.  HENT:  Negative for mouth sores, sore throat and trouble swallowing.    Eyes:  Negative for redness and visual disturbance.  Respiratory:  Negative for cough, shortness of breath and wheezing.   Cardiovascular:  Negative for chest pain and leg swelling.  Gastrointestinal:  Negative for abdominal pain, nausea and vomiting.  Endocrine: Negative for cold intolerance, heat intolerance, polydipsia, polyphagia and polyuria.  Genitourinary:  Positive for menstrual problem. Negative for decreased urine volume, dysuria and hematuria.  Musculoskeletal:  Negative for gait problem and myalgias.  Skin:  Negative for color change and rash.  Allergic/Immunologic: Positive for environmental allergies.  Neurological:  Negative for seizures, weakness and headaches.  Hematological:  Negative for adenopathy. Does not bruise/bleed easily.  Psychiatric/Behavioral:  Negative for confusion. The patient is not nervous/anxious.   All other systems reviewed and are negative.  Current Outpatient Medications on File Prior to Visit  Medication Sig Dispense Refill   amLODipine  (NORVASC ) 5 MG tablet TAKE 1 TABLET BY MOUTH ONCE DAILY --DUE  FOR  PHYSICAL/FOLLOW  UP-- 30 tablet 0   fluticasone  (FLONASE ) 50 MCG/ACT nasal spray Place 1 spray into both nostrils 2 (two) times daily. 16 g 1   folic acid  (FOLVITE ) 800 MCG tablet Take 1 tablet (800 mcg total) by mouth daily. 90 tablet 0   labetalol  (NORMODYNE ) 100 MG tablet Take 1 tablet (100 mg total) by mouth 2 (two) times daily. Due for follow up/physical 60 tablet 0  Melatonin 10 MG TABS Take 1 tablet by mouth at bedtime as needed.     nicotine  (NICODERM CQ  - DOSED IN MG/24 HOURS) 14 mg/24hr patch Place 1 patch (14 mg total) onto the skin daily. 28 patch 0   [DISCONTINUED] norgestimate -ethinyl estradiol  (ORTHO-CYCLEN, 28,) 0.25-35 MG-MCG tablet Take 1 tablet by mouth daily. 28 tablet 2   [DISCONTINUED] omeprazole  (PRILOSEC) 40 MG capsule Take 1 capsule (40 mg total) by mouth daily before breakfast. 30 capsule 2   No current facility-administered  medications on file prior to visit.   Past Medical History:  Diagnosis Date   Asthma    GERD (gastroesophageal reflux disease)    Migraines    with aura   STD (sexually transmitted disease)    chlamydia 05-29-2019 treated   Past Surgical History:  Procedure Laterality Date   None     Allergies  Allergen Reactions   Penicillins     bumps   Family History  Problem Relation Age of Onset   Hypertension Mother    Breast cancer Mother        in 9's   Hypertension Father    Hypertension Sister    Hypertension Maternal Grandmother    Cancer Paternal Grandmother    Cancer Paternal Grandfather    Social History   Socioeconomic History   Marital status: Single    Spouse name: Not on file   Number of children: Not on file   Years of education: Not on file   Highest education level: GED or equivalent  Occupational History   Not on file  Tobacco Use   Smoking status: Every Day    Current packs/day: 0.25    Average packs/day: 0.3 packs/day for 7.0 years (1.8 ttl pk-yrs)    Types: Cigarettes   Smokeless tobacco: Never  Vaping Use   Vaping status: Never Used  Substance and Sexual Activity   Alcohol use: No   Drug use: No   Sexual activity: Yes    Partners: Male    Birth control/protection: None  Other Topics Concern   Not on file  Social History Narrative   Not on file   Social Drivers of Health   Financial Resource Strain: Low Risk  (07/01/2021)   Overall Financial Resource Strain (CARDIA)    Difficulty of Paying Living Expenses: Not very hard  Food Insecurity: No Food Insecurity (07/01/2021)   Hunger Vital Sign    Worried About Running Out of Food in the Last Year: Never true    Ran Out of Food in the Last Year: Never true  Transportation Needs: No Transportation Needs (07/01/2021)   PRAPARE - Administrator, Civil Service (Medical): No    Lack of Transportation (Non-Medical): No  Physical Activity: Sufficiently Active (07/01/2021)   Exercise  Vital Sign    Days of Exercise per Week: 5 days    Minutes of Exercise per Session: 30 min  Stress: Stress Concern Present (07/01/2021)   [employer] of Occupational Health - Occupational Stress Questionnaire    Feeling of Stress : Rather much  Social Connections: Socially Isolated (07/01/2021)   Social Connection and Isolation Panel [NHANES]    Frequency of Communication with Friends and Family: Once a week    Frequency of Social Gatherings with Friends and Family: Once a week    Attends Religious Services: 1 to 4 times per year    Active Member of [employer] or Organizations: No    Attends Banker Meetings: Not on file  Marital Status: Never married   Vitals:   12/07/23 1546  BP: 124/80  Pulse: 74  Resp: 12   Body mass index is 39.58 kg/m.  Wt Readings from Last 3 Encounters:  12/07/23 268 lb (121.6 kg)  09/02/22 280 lb 6 oz (127.2 kg)  07/23/22 279 lb (126.6 kg)   Physical Exam Vitals and nursing note reviewed.  Constitutional:      General: She is not in acute distress.    Appearance: She is well-developed.  HENT:     Head: Normocephalic and atraumatic.     Right Ear: Tympanic membrane, ear canal and external ear normal.     Left Ear: Tympanic membrane, ear canal and external ear normal.     Mouth/Throat:     Mouth: Mucous membranes are moist.     Pharynx: Oropharynx is clear. Uvula midline.  Eyes:     Extraocular Movements: Extraocular movements intact.     Conjunctiva/sclera: Conjunctivae normal.     Pupils: Pupils are equal, round, and reactive to light.  Neck:     Thyroid : Thyromegaly (? Left thyroid  nodule) present. No thyroid  mass.     Comments: ? Left thyroid  nodule Cardiovascular:     Rate and Rhythm: Normal rate and regular rhythm.     Pulses:          Dorsalis pedis pulses are 2+ on the right side and 2+ on the left side.     Heart sounds: No murmur heard. Pulmonary:     Effort: Pulmonary effort is normal. No respiratory distress.      Breath sounds: Normal breath sounds.  Abdominal:     Palpations: Abdomen is soft. There is no hepatomegaly or mass.     Tenderness: There is no abdominal tenderness.  Genitourinary:    Comments: Deferred to gyn. Musculoskeletal:     Right lower leg: No edema.     Left lower leg: No edema.     Comments: No major deformity or signs of synovitis appreciated.  Lymphadenopathy:     Cervical: No cervical adenopathy.     Upper Body:     Right upper body: No supraclavicular adenopathy.     Left upper body: No supraclavicular adenopathy.  Skin:    General: Skin is warm.     Findings: No erythema or rash.  Neurological:     General: No focal deficit present.     Mental Status: She is alert and oriented to person, place, and time.     Cranial Nerves: No cranial nerve deficit.     Sensory: No sensory deficit.     Motor: No weakness.     Coordination: Coordination normal.     Gait: Gait normal.     Deep Tendon Reflexes:     Reflex Scores:      Bicep reflexes are 2+ on the right side and 2+ on the left side.      Patellar reflexes are 2+ on the right side and 2+ on the left side. Psychiatric:        Mood and Affect: Mood and affect normal.   ASSESSMENT AND PLAN:  Frances Murphy was here today for her annual physical examination.  Orders Placed This Encounter  Procedures   US  THYROID    Hemoglobin A1c   Lipid panel   Comprehensive metabolic panel with GFR   TSH   Lab Results  Component Value Date   CHOL 131 12/07/2023   HDL 55.10 12/07/2023   LDLCALC 62 12/07/2023  TRIG 70.0 12/07/2023   CHOLHDL 2 12/07/2023   Lab Results  Component Value Date   ALT 11 12/07/2023   AST 20 12/07/2023   ALKPHOS 44 12/07/2023   BILITOT 0.4 12/07/2023   Lab Results  Component Value Date   NA 139 12/07/2023   CL 104 12/07/2023   K 3.8 12/07/2023   CO2 28 12/07/2023   BUN 11 12/07/2023   CREATININE 0.89 12/07/2023   GFR 85.00 12/07/2023   CALCIUM  9.4 12/07/2023   ALBUMIN 4.4  12/07/2023   GLUCOSE 78 12/07/2023   Lab Results  Component Value Date   TSH 1.81 12/07/2023   Routine general medical examination at a health care facility Assessment & Plan: We discussed the importance of regular physical activity and healthy diet for prevention of chronic illness and/or complications. Preventive guidelines reviewed. Vaccination up to date. Continue her female care with gynecologist. Next CPE in a year.  Screening for lipoid disorders -     Lipid panel; Future  Screening for endocrine, metabolic and immunity disorder -     Hemoglobin A1c; Future -     Comprehensive metabolic panel with GFR; Future  Essential hypertension Assessment & Plan: BP adequately controlled. Recommend monitoring BP at home at least once per month. Continue Labetalol  100 mg bid and Amlodipine  5 mg daily as well as low salt diet. Eye exam is current. As far as problem is stable, annual follow up is appropriate.  Orders: -     Comprehensive metabolic panel with GFR; Future -     TSH; Future  Enlarged thyroid  gland ? Left thyroid  nodule. Thyroid  US  will be arranged.  -     US  THYROID ; Future  Return in 1 year (on 12/06/2024) for CPE, chronic problems.  I, Fritz Jewel Wierda, acting as a scribe for Mathea Frieling [place], MD., have documented all relevant documentation on the behalf of Giani Betzold [place], MD, as directed by  Romulus Hanrahan [place], MD while in the presence of Latonja Bobeck [place], MD.   I, Lela Murfin [place], MD, have reviewed all documentation for this visit. The documentation on 12/07/23 for the exam, diagnosis, procedures, and orders are all accurate and complete.  Briany Aye G. [place], MD  [facility]. [place] office.

## 2023-12-07 NOTE — Patient Instructions (Addendum)
 A few things to remember from today's visit:  Routine general medical examination at a health care facility  Screening for lipoid disorders - Plan: Lipid panel  Screening for endocrine, metabolic and immunity disorder - Plan: Hemoglobin A1c, Comprehensive metabolic panel with GFR  Essential hypertension - Plan: Comprehensive metabolic panel with GFR, TSH  Enlarged thyroid  gland - Plan: US  THYROID   If you need refills for medications you take chronically, please call your pharmacy. Do not use My Chart to request refills or for acute issues that need immediate attention. If you send a my chart message, it may take a few days to be addressed, specially if I am not in the office.  Please be sure medication list is accurate. If a new problem present, please set up appointment sooner than planned today.  Health Maintenance, Female Adopting a healthy lifestyle and getting preventive care are important in promoting health and wellness. Ask your health care provider about: The right schedule for you to have regular tests and exams. Things you can do on your own to prevent diseases and keep yourself healthy. What should I know about diet, weight, and exercise? Eat a healthy diet  Eat a diet that includes plenty of vegetables, fruits, low-fat dairy products, and lean protein. Do not eat a lot of foods that are high in solid fats, added sugars, or sodium. Maintain a healthy weight Body mass index (BMI) is used to identify weight problems. It estimates body fat based on height and weight. Your health care provider can help determine your BMI and help you achieve or maintain a healthy weight. Get regular exercise Get regular exercise. This is one of the most important things you can do for your health. Most adults should: Exercise for at least 150 minutes each week. The exercise should increase your heart rate and make you sweat (moderate-intensity exercise). Do strengthening exercises at least  twice a week. This is in addition to the moderate-intensity exercise. Spend less time sitting. Even light physical activity can be beneficial. Watch cholesterol and blood lipids Have your blood tested for lipids and cholesterol at 34 years of age, then have this test every 5 years. Have your cholesterol levels checked more often if: Your lipid or cholesterol levels are high. You are older than 34 years of age. You are at high risk for heart disease. What should I know about cancer screening? Depending on your health history and family history, you may need to have cancer screening at various ages. This may include screening for: Breast cancer. Cervical cancer. Colorectal cancer. Skin cancer. Lung cancer. What should I know about heart disease, diabetes, and high blood pressure? Blood pressure and heart disease High blood pressure causes heart disease and increases the risk of stroke. This is more likely to develop in people who have high blood pressure readings or are overweight. Have your blood pressure checked: Every 3-5 years if you are 45-36 years of age. Every year if you are 52 years old or older. Diabetes Have regular diabetes screenings. This checks your fasting blood sugar level. Have the screening done: Once every three years after age 53 if you are at a normal weight and have a low risk for diabetes. More often and at a younger age if you are overweight or have a high risk for diabetes. What should I know about preventing infection? Hepatitis B If you have a higher risk for hepatitis B, you should be screened for this virus. Talk with your health care provider  to find out if you are at risk for hepatitis B infection. Hepatitis C Testing is recommended for: Everyone born from [birth year] through 1966. Anyone with known risk factors for hepatitis C. Sexually transmitted infections (STIs) Get screened for STIs, including gonorrhea and chlamydia, if: You are sexually active and are  younger than 34 years of age. You are older than 34 years of age and your health care provider tells you that you are at risk for this type of infection. Your sexual activity has changed since you were last screened, and you are at increased risk for chlamydia or gonorrhea. Ask your health care provider if you are at risk. Ask your health care provider about whether you are at high risk for HIV. Your health care provider may recommend a prescription medicine to help prevent HIV infection. If you choose to take medicine to prevent HIV, you should first get tested for HIV. You should then be tested every 3 months for as long as you are taking the medicine. Pregnancy If you are about to stop having your period (premenopausal) and you may become pregnant, seek counseling before you get pregnant. Take 400 to 800 micrograms (mcg) of folic acid  every day if you become pregnant. Ask for birth control (contraception) if you want to prevent pregnancy. Osteoporosis and menopause Osteoporosis is a disease in which the bones lose minerals and strength with aging. This can result in bone fractures. If you are 72 years old or older, or if you are at risk for osteoporosis and fractures, ask your health care provider if you should: Be screened for bone loss. Take a calcium  or vitamin D supplement to lower your risk of fractures. Be given hormone replacement therapy (HRT) to treat symptoms of menopause. Follow these instructions at home: Alcohol use Do not drink alcohol if: Your health care provider tells you not to drink. You are pregnant, may be pregnant, or are planning to become pregnant. If you drink alcohol: Limit how much you have to: 0-1 drink a day. Know how much alcohol is in your drink. In the [place] one drink equals one 12 oz bottle of beer (355 mL), one 5 oz glass of wine (148 mL), or one 1 oz glass of hard liquor (44 mL). Lifestyle Do not use any products that contain nicotine  or tobacco. These  products include cigarettes, chewing tobacco, and vaping devices, such as e-cigarettes. If you need help quitting, ask your health care provider. Do not use street drugs. Do not share needles. Ask your health care provider for help if you need support or information about quitting drugs. General instructions Schedule regular health, dental, and eye exams. Stay current with your vaccines. Tell your health care provider if: You often feel depressed. You have ever been abused or do not feel safe at home. Summary Adopting a healthy lifestyle and getting preventive care are important in promoting health and wellness. Follow your health care provider's instructions about healthy diet, exercising, and getting tested or screened for diseases. Follow your health care provider's instructions on monitoring your cholesterol and blood pressure. This information is not intended to replace advice given to you by your health care provider. Make sure you discuss any questions you have with your health care provider. Document Revised: 12/09/2020 Document Reviewed: 12/09/2020 Elsevier Patient Education  2024 [employer].

## 2023-12-07 NOTE — Assessment & Plan Note (Signed)
 We discussed the importance of regular physical activity and healthy diet for prevention of chronic illness and/or complications. Preventive guidelines reviewed. Vaccination up to date. Continue her female care with gynecologist. Next CPE in a year.

## 2023-12-08 ENCOUNTER — Encounter: Payer: Self-pay | Admitting: Family Medicine

## 2023-12-08 LAB — LIPID PANEL
Cholesterol: 131 mg/dL (ref 0–200)
HDL: 55.1 mg/dL (ref >39.00)
LDL Cholesterol: 62 mg/dL (ref 0–99)
NonHDL: 76.2
Total CHOL/HDL Ratio: 2
Triglycerides: 70 mg/dL (ref 0.0–149.0)
VLDL: 14 mg/dL (ref 0.0–40.0)

## 2023-12-08 LAB — COMPREHENSIVE METABOLIC PANEL WITH GFR
ALT: 11 U/L (ref 0–35)
AST: 20 U/L (ref 0–37)
Albumin: 4.4 g/dL (ref 3.5–5.2)
Alkaline Phosphatase: 44 U/L (ref 39–117)
BUN: 11 mg/dL (ref 6–23)
CO2: 28 meq/L (ref 19–32)
Calcium: 9.4 mg/dL (ref 8.4–10.5)
Chloride: 104 meq/L (ref 96–112)
Creatinine, Ser: 0.89 mg/dL (ref 0.40–1.20)
GFR: 85 mL/min (ref >60.00)
Glucose, Bld: 78 mg/dL (ref 70–99)
Potassium: 3.8 meq/L (ref 3.5–5.1)
Sodium: 139 meq/L (ref 135–145)
Total Bilirubin: 0.4 mg/dL (ref 0.2–1.2)
Total Protein: 7.6 g/dL (ref 6.0–8.3)

## 2023-12-08 LAB — TSH: TSH: 1.81 u[IU]/mL (ref 0.35–5.50)

## 2023-12-10 ENCOUNTER — Other Ambulatory Visit: Payer: Self-pay

## 2023-12-10 DIAGNOSIS — Z130 Encounter for screening for diseases of the blood and blood-forming organs and certain disorders involving the immune mechanism: Secondary | ICD-10-CM

## 2023-12-13 ENCOUNTER — Other Ambulatory Visit

## 2023-12-14 ENCOUNTER — Telehealth: Payer: Self-pay | Admitting: Family Medicine

## 2023-12-14 ENCOUNTER — Other Ambulatory Visit: Payer: Self-pay | Admitting: Family Medicine

## 2023-12-14 DIAGNOSIS — I10 Essential (primary) hypertension: Secondary | ICD-10-CM

## 2023-12-14 NOTE — Telephone Encounter (Unsigned)
 Copied from CRM [id number]. Topic: General - Other >> Dec 13, 2023  4:05 PM Kita Perish H wrote: Reason for CRM: Patient called upset lab appointment was originally scheduled at [facility] and appointment was rescheduled to [place] and patient wasn't aware and went to [facility] location. Please reach out.  Sae [phone]

## 2023-12-18 ENCOUNTER — Ambulatory Visit (HOSPITAL_BASED_OUTPATIENT_CLINIC_OR_DEPARTMENT_OTHER)

## 2023-12-28 ENCOUNTER — Other Ambulatory Visit: Payer: Self-pay | Admitting: Family Medicine

## 2023-12-28 DIAGNOSIS — I10 Essential (primary) hypertension: Secondary | ICD-10-CM

## 2024-05-02 ENCOUNTER — Ambulatory Visit: Admitting: Family Medicine

## 2024-05-02 ENCOUNTER — Encounter: Payer: Self-pay | Admitting: Family Medicine

## 2024-05-02 VITALS — BP 124/80 | HR 85 | Temp 98.8°F | Resp 16 | Ht 69.0 in | Wt 274.0 lb

## 2024-05-02 DIAGNOSIS — I10 Essential (primary) hypertension: Secondary | ICD-10-CM

## 2024-05-02 DIAGNOSIS — R21 Rash and other nonspecific skin eruption: Secondary | ICD-10-CM

## 2024-05-02 MED ORDER — TRIAMCINOLONE ACETONIDE 0.1 % EX CREA: 1.0000 | TOPICAL_CREAM | Freq: Two times a day (BID) | CUTANEOUS | 1 refills | Status: AC

## 2024-05-02 NOTE — Assessment & Plan Note (Addendum)
 BP has been adequately controlled. We discussed options for Amlodipine  given the fact she is trying to get pregnant, she prefer no changes for now but will consider changing to nifedipine. Continue labetalol  100 mg twice daily, amlodipine  5 mg daily, and low-salt diet. Continue monitoring BP and regularly.

## 2024-05-02 NOTE — Progress Notes (Signed)
 ACUTE VISIT Chief Complaint  Patient presents with   Rash    Patient complains of intermittent itchy rash noted on her neck, worsening past 2 days, tried shea butter with little to no relief   Discussed the use of AI scribe software for clinical note transcription with the patient, who gave verbal consent to proceed.  History of Present Illness Frances Murphy is a 33 year old female with past medical history significant for hypertension, seasonal allergies, asthma, GERD, and BMI 40 who presents with an itchy rash around the neck as described above.  She has a pruritic rash around her neck, which has occurred for the third time. The rash is intermittent, typically resolves on its own, but this episode has persisted for two to three days. She has previously used turmeric soap and oil, which seemed to help, but she is currently out of these products. She has not identified exacerbating factors.  Hot water seems to irritate the rash and exacerbates pruritus, she uses a plant-based body wash which she believes may be aggravating problem due to its fragrance.  She denies using any other products with strong fragrances on her neck and does not wear necklaces.  She has a history of eczema from childhood but denies any current itchy scalp or other affected areas on her body. The itching is primarily in the front of her neck, and she describes the skin as feeling rough, similar to a burn.  She has recently completed a five-day course of medication to stimulate ovulation as part of her fertility treatment.  She is inquiring about current antihypertensive medications. Currently she is on labetalol  100 mg twice daily and amlodipine  5 mg daily. She sometimes skips the second dose of labetalol . Negative for unusual headache, CP, palpitation, dyspnea, focal neurologic deficit, or edema. She recently had blood work done, which she reports was normal.  Review of Systems  Constitutional:   Negative for activity change, appetite change, chills and fever.  Respiratory:  Negative for cough and wheezing.   Gastrointestinal:  Negative for abdominal pain and nausea.  Musculoskeletal:  Negative for joint swelling and myalgias.  Skin:  Negative for wound.  Neurological:  Negative for syncope, facial asymmetry and weakness.  See other pertinent positives and negatives in HPI.  Current Outpatient Medications on File Prior to Visit  Medication Sig Dispense Refill   amLODipine  (NORVASC ) 5 MG tablet Take 1 tablet (5 mg total) by mouth daily. 90 tablet 3   labetalol  (NORMODYNE ) 100 MG tablet Take 1 tablet (100 mg total) by mouth 2 (two) times daily. 180 tablet 3   Melatonin 10 MG TABS Take 1 tablet by mouth at bedtime as needed.     [DISCONTINUED] norgestimate -ethinyl estradiol  (ORTHO-CYCLEN, 28,) 0.25-35 MG-MCG tablet Take 1 tablet by mouth daily. 28 tablet 2   [DISCONTINUED] omeprazole  (PRILOSEC) 40 MG capsule Take 1 capsule (40 mg total) by mouth daily before breakfast. 30 capsule 2   No current facility-administered medications on file prior to visit.   Past Medical History:  Diagnosis Date   Asthma    GERD (gastroesophageal reflux disease)    Migraines    with aura   STD (sexually transmitted disease)    chlamydia 05-29-2019 treated   Allergies  Allergen Reactions   Penicillins     bumps   Social History   Socioeconomic History   Marital status: Single    Spouse name: Not on file   Number of children: Not on file   Years  of education: Not on file   Highest education level: GED or equivalent  Occupational History   Not on file  Tobacco Use   Smoking status: Every Day    Current packs/day: 0.25    Average packs/day: 0.3 packs/day for 7.0 years (1.8 ttl pk-yrs)    Types: Cigarettes   Smokeless tobacco: Never  Vaping Use   Vaping status: Never Used  Substance and Sexual Activity   Alcohol use: No   Drug use: No   Sexual activity: Yes    Partners: Male    Birth  control/protection: None  Other Topics Concern   Not on file  Social History Narrative   Not on file   Social Drivers of Health   Financial Resource Strain: Low Risk  (07/01/2021)   Overall Financial Resource Strain (CARDIA)    Difficulty of Paying Living Expenses: Not very hard  Food Insecurity: No Food Insecurity (07/01/2021)   Hunger Vital Sign    Worried About Running Out of Food in the Last Year: Never true    Ran Out of Food in the Last Year: Never true  Transportation Needs: No Transportation Needs (07/01/2021)   PRAPARE - Administrator, Civil Service (Medical): No    Lack of Transportation (Non-Medical): No  Physical Activity: Sufficiently Active (07/01/2021)   Exercise Vital Sign    Days of Exercise per Week: 5 days    Minutes of Exercise per Session: 30 min  Stress: Stress Concern Present (07/01/2021)   [employer] of Occupational Health - Occupational Stress Questionnaire    Feeling of Stress : Rather much  Social Connections: Socially Isolated (07/01/2021)   Social Connection and Isolation Panel    Frequency of Communication with Friends and Family: Once a week    Frequency of Social Gatherings with Friends and Family: Once a week    Attends Religious Services: 1 to 4 times per year    Active Member of [employer] or Organizations: No    Attends Banker Meetings: Not on file    Marital Status: Never married    Vitals:   05/02/24 1449  BP: 124/80  Pulse: 85  Resp: 16  Temp: 98.8 F (37.1 C)  SpO2: 98%   Body mass index is 40.46 kg/m.  Physical Exam Vitals and nursing note reviewed.  Constitutional:      General: She is not in acute distress.    Appearance: She is well-developed.  HENT:     Head: Normocephalic and atraumatic.     Mouth/Throat:     Mouth: Mucous membranes are moist.  Eyes:     Conjunctiva/sclera: Conjunctivae normal.  Cardiovascular:     Rate and Rhythm: Normal rate and regular rhythm.     Heart sounds: No  murmur heard. Pulmonary:     Effort: Pulmonary effort is normal. No respiratory distress.     Breath sounds: Normal breath sounds.  Lymphadenopathy:     Cervical: No cervical adenopathy.  Skin:    General: Skin is warm.     Findings: No rash.     Comments: Hyperpigmented and thick area around neck, more pronounce on lateral aspects, R>L. I do not appreciate an erythematous rash.  Neurological:     Mental Status: She is alert and oriented to person, place, and time.  Psychiatric:        Mood and Affect: Mood and affect normal.   ASSESSMENT AND PLAN:  Ms Labine was seen today for rash and follow-up.  Localized skin  eruption I do not appreciate erythematous rash but rather hyperpigmented thick skin around neck, similar to acanthosis nigricans.She reports that she recently had fasting insulin level checked and it was in normal range. Wt loss may help (in case of pseudo AN) Recommend small amount of triamcinolone  on affected area twice daily for 14 days then she can continue mixing triamcinolone  with Eucerin or Cetaphil and apply on the area once per week. Follow-up as needed.  -     Triamcinolone  Acetonide; Apply 1 Application topically 2 (two) times daily.  Dispense: 30 g; Refill: 1  Essential hypertension Assessment & Plan: BP has been adequately controlled. We discussed options for Amlodipine  given the fact she is trying to get pregnant, she prefer no changes for now but will consider changing to nifedipine. Continue labetalol  100 mg twice daily, amlodipine  5 mg daily, and low-salt diet. Continue monitoring BP and regularly.  I personally spent a total of 31 minutes in the care of the patient today including preparing to see the patient, getting/reviewing separately obtained history, performing a medically appropriate exam/evaluation, counseling and educating, and documenting clinical information in the EHR.  Return if symptoms worsen or fail to improve, for keep next  appointment.  Thadeus Gandolfi G. [place], MD  [facility]. [place] office.

## 2024-05-02 NOTE — Patient Instructions (Addendum)
 A few things to remember from today's visit:  Localized skin eruption - Plan: triamcinolone  cream (KENALOG ) 0.1 % Apply small amount of triamcinolone  ,enough to cover affected area, for 2 weeks.  To prevent further flareups, you can try mixing triamcinolone  with Eucerin or Cetaphil and a tiny on area twice per week.  We could change amlodipine  for nifedipine if you get pregnant.  Labetalol  is safe.  If you need refills for medications you take chronically, please call your pharmacy. Do not use My Chart to request refills or for acute issues that need immediate attention. If you send a my chart message, it may take a few days to be addressed, specially if I am not in the office.  Please be sure medication list is accurate. If a new problem present, please set up appointment sooner than planned today.
